# Patient Record
Sex: Female | Born: 1980 | Race: Black or African American | Hispanic: No | Smoking: Never smoker
Health system: Southern US, Community
[De-identification: ages and names within clinical notes are randomized; demographics above are authoritative.]

## PROBLEM LIST (undated history)

## (undated) DIAGNOSIS — K219 Gastro-esophageal reflux disease without esophagitis: Secondary | ICD-10-CM

## (undated) DIAGNOSIS — A549 Gonococcal infection, unspecified: Secondary | ICD-10-CM

## (undated) DIAGNOSIS — Z8619 Personal history of other infectious and parasitic diseases: Secondary | ICD-10-CM

## (undated) DIAGNOSIS — D219 Benign neoplasm of connective and other soft tissue, unspecified: Secondary | ICD-10-CM

## (undated) DIAGNOSIS — D509 Iron deficiency anemia, unspecified: Secondary | ICD-10-CM

## (undated) DIAGNOSIS — D649 Anemia, unspecified: Secondary | ICD-10-CM

## (undated) DIAGNOSIS — D259 Leiomyoma of uterus, unspecified: Secondary | ICD-10-CM

## (undated) DIAGNOSIS — J45909 Unspecified asthma, uncomplicated: Secondary | ICD-10-CM

## (undated) DIAGNOSIS — J40 Bronchitis, not specified as acute or chronic: Secondary | ICD-10-CM

## (undated) DIAGNOSIS — Z973 Presence of spectacles and contact lenses: Secondary | ICD-10-CM

## (undated) DIAGNOSIS — J302 Other seasonal allergic rhinitis: Secondary | ICD-10-CM

## (undated) DIAGNOSIS — J4540 Moderate persistent asthma, uncomplicated: Secondary | ICD-10-CM

## (undated) DIAGNOSIS — G4733 Obstructive sleep apnea (adult) (pediatric): Secondary | ICD-10-CM

## (undated) HISTORY — PX: NO PAST SURGERIES: SHX2092

---

## 1999-03-18 ENCOUNTER — Emergency Department (HOSPITAL_COMMUNITY): Admission: EM | Admit: 1999-03-18 | Discharge: 1999-03-18 | Payer: Self-pay | Admitting: Emergency Medicine

## 2000-02-11 ENCOUNTER — Other Ambulatory Visit: Admission: RE | Admit: 2000-02-11 | Discharge: 2000-02-11 | Payer: Self-pay | Admitting: Obstetrics

## 2000-02-11 ENCOUNTER — Encounter (INDEPENDENT_AMBULATORY_CARE_PROVIDER_SITE_OTHER): Payer: Self-pay | Admitting: Specialist

## 2000-04-24 ENCOUNTER — Emergency Department (HOSPITAL_COMMUNITY): Admission: EM | Admit: 2000-04-24 | Discharge: 2000-04-24 | Payer: Self-pay | Admitting: Emergency Medicine

## 2001-02-01 ENCOUNTER — Emergency Department (HOSPITAL_COMMUNITY): Admission: EM | Admit: 2001-02-01 | Discharge: 2001-02-01 | Payer: Self-pay

## 2001-07-01 ENCOUNTER — Emergency Department (HOSPITAL_COMMUNITY): Admission: EM | Admit: 2001-07-01 | Discharge: 2001-07-01 | Payer: Self-pay | Admitting: Emergency Medicine

## 2001-07-02 ENCOUNTER — Emergency Department (HOSPITAL_COMMUNITY): Admission: EM | Admit: 2001-07-02 | Discharge: 2001-07-02 | Payer: Self-pay | Admitting: Emergency Medicine

## 2001-09-01 ENCOUNTER — Inpatient Hospital Stay (HOSPITAL_COMMUNITY): Admission: AD | Admit: 2001-09-01 | Discharge: 2001-09-01 | Payer: Self-pay | Admitting: *Deleted

## 2001-09-03 ENCOUNTER — Inpatient Hospital Stay (HOSPITAL_COMMUNITY): Admission: AD | Admit: 2001-09-03 | Discharge: 2001-09-03 | Payer: Self-pay | Admitting: Obstetrics and Gynecology

## 2001-10-20 ENCOUNTER — Emergency Department (HOSPITAL_COMMUNITY): Admission: EM | Admit: 2001-10-20 | Discharge: 2001-10-20 | Payer: Self-pay | Admitting: *Deleted

## 2004-02-11 ENCOUNTER — Emergency Department (HOSPITAL_COMMUNITY): Admission: EM | Admit: 2004-02-11 | Discharge: 2004-02-12 | Payer: Self-pay

## 2005-03-30 ENCOUNTER — Emergency Department (HOSPITAL_COMMUNITY): Admission: EM | Admit: 2005-03-30 | Discharge: 2005-03-31 | Payer: Self-pay | Admitting: Emergency Medicine

## 2005-08-26 ENCOUNTER — Inpatient Hospital Stay (HOSPITAL_COMMUNITY): Admission: AD | Admit: 2005-08-26 | Discharge: 2005-08-26 | Payer: Self-pay | Admitting: *Deleted

## 2006-04-06 ENCOUNTER — Inpatient Hospital Stay (HOSPITAL_COMMUNITY): Admission: AD | Admit: 2006-04-06 | Discharge: 2006-04-06 | Payer: Self-pay | Admitting: Obstetrics and Gynecology

## 2006-04-20 ENCOUNTER — Ambulatory Visit: Payer: Self-pay | Admitting: Obstetrics and Gynecology

## 2006-06-28 ENCOUNTER — Encounter (INDEPENDENT_AMBULATORY_CARE_PROVIDER_SITE_OTHER): Payer: Self-pay | Admitting: *Deleted

## 2006-06-28 ENCOUNTER — Ambulatory Visit: Payer: Self-pay | Admitting: Family Medicine

## 2006-07-03 ENCOUNTER — Ambulatory Visit (HOSPITAL_COMMUNITY): Admission: RE | Admit: 2006-07-03 | Discharge: 2006-07-03 | Payer: Self-pay | Admitting: Obstetrics and Gynecology

## 2006-07-26 ENCOUNTER — Ambulatory Visit: Payer: Self-pay | Admitting: Obstetrics & Gynecology

## 2006-08-25 ENCOUNTER — Encounter (INDEPENDENT_AMBULATORY_CARE_PROVIDER_SITE_OTHER): Payer: Self-pay | Admitting: *Deleted

## 2006-08-25 ENCOUNTER — Other Ambulatory Visit: Admission: RE | Admit: 2006-08-25 | Discharge: 2006-08-25 | Payer: Self-pay | Admitting: Obstetrics and Gynecology

## 2006-08-25 ENCOUNTER — Ambulatory Visit: Payer: Self-pay | Admitting: Obstetrics and Gynecology

## 2006-09-07 ENCOUNTER — Ambulatory Visit: Payer: Self-pay | Admitting: Family Medicine

## 2007-02-25 ENCOUNTER — Inpatient Hospital Stay (HOSPITAL_COMMUNITY): Admission: AD | Admit: 2007-02-25 | Discharge: 2007-02-26 | Payer: Self-pay | Admitting: Obstetrics & Gynecology

## 2007-07-08 ENCOUNTER — Emergency Department (HOSPITAL_COMMUNITY): Admission: EM | Admit: 2007-07-08 | Discharge: 2007-07-08 | Payer: Self-pay | Admitting: Family Medicine

## 2008-02-26 ENCOUNTER — Inpatient Hospital Stay (HOSPITAL_COMMUNITY): Admission: AD | Admit: 2008-02-26 | Discharge: 2008-02-27 | Payer: Self-pay | Admitting: Obstetrics & Gynecology

## 2008-03-07 ENCOUNTER — Encounter: Payer: Self-pay | Admitting: Physician Assistant

## 2008-03-07 ENCOUNTER — Ambulatory Visit: Payer: Self-pay | Admitting: Obstetrics & Gynecology

## 2008-03-28 ENCOUNTER — Ambulatory Visit: Payer: Self-pay | Admitting: Obstetrics & Gynecology

## 2008-04-03 ENCOUNTER — Ambulatory Visit (HOSPITAL_COMMUNITY): Admission: RE | Admit: 2008-04-03 | Discharge: 2008-04-03 | Payer: Self-pay | Admitting: Family Medicine

## 2008-04-23 ENCOUNTER — Ambulatory Visit: Payer: Self-pay | Admitting: Obstetrics and Gynecology

## 2008-06-11 ENCOUNTER — Ambulatory Visit: Payer: Self-pay | Admitting: Obstetrics and Gynecology

## 2008-12-29 ENCOUNTER — Emergency Department (HOSPITAL_COMMUNITY): Admission: EM | Admit: 2008-12-29 | Discharge: 2008-12-29 | Payer: Self-pay | Admitting: Anesthesiology

## 2009-06-30 ENCOUNTER — Emergency Department (HOSPITAL_COMMUNITY): Admission: EM | Admit: 2009-06-30 | Discharge: 2009-06-30 | Payer: Self-pay | Admitting: Emergency Medicine

## 2009-07-08 ENCOUNTER — Ambulatory Visit: Payer: Self-pay | Admitting: Family Medicine

## 2009-07-08 ENCOUNTER — Encounter: Payer: Self-pay | Admitting: Family Medicine

## 2009-07-08 DIAGNOSIS — D649 Anemia, unspecified: Secondary | ICD-10-CM

## 2009-07-08 DIAGNOSIS — L049 Acute lymphadenitis, unspecified: Secondary | ICD-10-CM

## 2009-07-08 LAB — CONVERTED CEMR LAB
BUN: 13 mg/dL (ref 6–23)
Basophils Absolute: 0 10*3/uL (ref 0.0–0.1)
CO2: 23 meq/L (ref 19–32)
Creatinine, Ser: 0.77 mg/dL (ref 0.40–1.20)
Eosinophils Absolute: 0.1 10*3/uL (ref 0.0–0.7)
Eosinophils Relative: 1 % (ref 0–5)
HDL: 49 mg/dL (ref >39)
LDL Cholesterol: 100 mg/dL — ABNORMAL HIGH (ref 0–99)
Lymphocytes Relative: 22 % (ref 12–46)
Lymphs Abs: 1.5 10*3/uL (ref 0.7–4.0)
MCV: 73.8 fL — ABNORMAL LOW (ref 78.0–100.0)
Monocytes Relative: 9 % (ref 3–12)
Neutro Abs: 4.7 10*3/uL (ref 1.7–7.7)
Neutrophils Relative %: 68 % (ref 43–77)
Potassium: 3.9 meq/L (ref 3.5–5.3)
RBC: 4.61 M/uL (ref 3.87–5.11)
RDW: 16.2 % — ABNORMAL HIGH (ref 11.5–15.5)
Sodium: 137 meq/L (ref 135–145)
Total CHOL/HDL Ratio: 3.3
Triglycerides: 55 mg/dL (ref <150)

## 2009-10-05 ENCOUNTER — Emergency Department (HOSPITAL_COMMUNITY): Admission: EM | Admit: 2009-10-05 | Discharge: 2009-10-05 | Payer: Self-pay | Admitting: Emergency Medicine

## 2010-02-23 ENCOUNTER — Telehealth: Payer: Self-pay | Admitting: Family Medicine

## 2010-02-23 ENCOUNTER — Inpatient Hospital Stay (HOSPITAL_COMMUNITY): Admission: AD | Admit: 2010-02-23 | Discharge: 2010-02-23 | Payer: Self-pay | Admitting: Family Medicine

## 2010-03-19 ENCOUNTER — Encounter: Payer: Self-pay | Admitting: Family Medicine

## 2010-07-05 ENCOUNTER — Encounter: Payer: Self-pay | Admitting: *Deleted

## 2010-07-13 NOTE — Letter (Signed)
Summary: Generic Letter  [facility]     [place], [place]    Phone:   Fax:     03/19/2010  Edmund Hilda [address] [place], [place]  [address]  Dear Ms. Perlie Gold,   I am writing to inform you that you need to make an appointment to come in for your yearly well-woman visit, which includes a PAP smear. Please call our office to schedule an appointment with me, Dr. Fara Boros, your new primary care doctor.  I look forward to meeting you!       Sincerely,   Demetria Pore MD  Appended Document: Generic Letter mailed.

## 2010-07-13 NOTE — Assessment & Plan Note (Signed)
Summary: NEW PT/FAMILY MEMBER OF [facility]/MULTIPLE ISSUES/[facility]   Vital Signs:  Patient profile:   30 year old female Height:      70.66 inches Weight:      193 pounds BMI:     27.28 Temp:     98.2 degrees F oral Pulse rate:   100 / minute BP sitting:   123 / 77  (right arm) Cuff size:   regular  Vitals Entered By: Tessie Fass CMA (July 08, 2009 3:20 PM) CC: knot on right side of neck x 2 weeks Is Patient Diabetic? No Pain Assessment Patient in pain? no        CC:  knot on right side of neck x 2 weeks.  History of Present Illness: Kaitlyn Hays comes in as a new patient to our clnic today.  She used to be seen at [facility].  States last seen in September.  She is wishing to establish primary care.  PMH, Meds, All, FH, SH reviewed and put in Centricity.  Has only 1 complaint today - lump on right side of neck. 1) Lump - showed up spontaneously 2 weeks ago.  Has grown slightly in size.  Did become tender a few days ago and so she went to the ED.  Was told it was a lymph node.  Given ibuprofen and sudafed.  No longer tender but bump still there.  No URI symptoms, fever, headaches, weight loss, ngiht sweats.  No cat scratches.  No exposure to TB.  Otherwise healthy without any other complaints.   Habits & Providers  Alcohol-Tobacco-Diet     Tobacco Status: never  Past History:  Past Medical History: Anemia  Past Surgical History: None  Family History: Mother died at age 62 of unknown cancer - but states it was NOT breast cancer Father alive and healthy Brother alive and healthy  Social History: Lives with her younger brother.  Works at [employer].  Never smoked.  No drugs or alcohol.  Graduated high school. Smoking Status:  never  Physical Exam  General:  thin, alert, well-groomed, pleasnt, NAD,  vitals reviewed Eyes:  conjunctiva clear, no injection Ears:  Bilateral TMs normal Mouth:  oropharynx pink and moist without erythema or  exudate. Neck:  see lymph node exam Lungs:  normal WOB, celar to ausc bilaterally Heart:  RRR without murmur Cervical Nodes:  1 mobile, enlarged lymph nose approx 1-1.5 x 1 cm at base of right neck.  not tender.  No other palpable nodes   Impression & Recommendations:  Problem # 1:  ACUTE LYMPHADENITIS (ICD-683) Assessment New solitary enlarged cervical l ymph node.  Differential wide but patient without risk factors or systemic symptoms for things such as cat scratch, TB, cancer.  Likely viral or possibly bacterial.  Will try round of amoxicillin but give full 2 months to resolve before obtaining biopsy.  Patient will return in 2 months for re-eval as well as for pap smear.  Her updated medication list for this problem includes:    Amoxicillin 500 Mg Caps (Amoxicillin) .Marland Kitchen... 1 cap by mouth three times a day for 10 days  Orders: CBC w/Diff-[facility] ([address])  Complete Medication List: 1)  Amoxicillin 500 Mg Caps (Amoxicillin) .Marland Kitchen.. 1 cap by mouth three times a day for 10 days  Other Orders: Lipid-[facility] ([id number]) Basic Met-[facility] ([id number])  Patient Instructions: 1)  Please schedule a pap smear sometime in March.  We can go over your bloodwork then as well.  If anything is badly  abnormal, we will contact you. 2)  Allow 2 months for the lymph node to go away, even after taking antibiotics.  Therefore, try to schedule your follow-up towards the end of March so we can re-evaluate your lymph node. 3)  If it gets much larger quickly or if you start developing them in other areas, please come back in sooner.  4)  It was very nice to meet you today! Prescriptions: AMOXICILLIN 500 MG CAPS (AMOXICILLIN) 1 cap by mouth three times a day for 10 days  #30 x 0   Entered and Authorized by:   Ardeen Garland  MD   Signed by:   Ardeen Garland  MD on 07/08/2009   Method used:   Print then Give to Patient   RxID:   [id number]

## 2010-07-13 NOTE — Progress Notes (Signed)
Summary: triage  Phone Note Call from Patient Call back at [phone]   Caller: Patient Summary of Call: having vag irritation and wants to come in today Initial call taken by: De Nurse,  February 23, 2010 10:30 AM  Follow-up for Phone Call        started yesterday. no discharge. hurts to wipe area. declined work in appt. will see Dr. Earlene Plater at 4:15 due to her work schedule. pcp is here but her scedule is full. advised wearing dress or skirt, not pants or jeans to help with the irritation Follow-up by: Golden Circle RN,  February 23, 2010 10:41 AM  Additional Follow-up for Phone Call Additional follow up Details #1::        Thank you for scheduling the appt.

## 2010-07-29 ENCOUNTER — Encounter: Payer: Self-pay | Admitting: *Deleted

## 2010-08-26 LAB — WET PREP, GENITAL

## 2010-08-26 LAB — GC/CHLAMYDIA PROBE AMP, GENITAL
Chlamydia, DNA Probe: NEGATIVE
GC Probe Amp, Genital: NEGATIVE

## 2010-08-31 LAB — POCT URINALYSIS DIP (DEVICE)
Ketones, ur: NEGATIVE mg/dL
Nitrite: NEGATIVE
Urobilinogen, UA: 0.2 mg/dL (ref 0.0–1.0)

## 2010-08-31 LAB — WET PREP, GENITAL: Trich, Wet Prep: NONE SEEN

## 2010-08-31 LAB — GC/CHLAMYDIA PROBE AMP, GENITAL: GC Probe Amp, Genital: NEGATIVE

## 2010-09-19 LAB — POCT I-STAT, CHEM 8
Calcium, Ion: 1.03 mmol/L — ABNORMAL LOW (ref 1.12–1.32)
Creatinine, Ser: 0.9 mg/dL (ref 0.4–1.2)
Glucose, Bld: 81 mg/dL (ref 70–99)
Hemoglobin: 13.6 g/dL (ref 12.0–15.0)

## 2010-09-19 LAB — URINALYSIS, ROUTINE W REFLEX MICROSCOPIC
Hgb urine dipstick: NEGATIVE
Ketones, ur: NEGATIVE mg/dL
Nitrite: NEGATIVE
Specific Gravity, Urine: 1.021 (ref 1.005–1.030)
Urobilinogen, UA: 1 mg/dL (ref 0.0–1.0)
pH: 6.5 (ref 5.0–8.0)

## 2010-09-19 LAB — CBC
Hemoglobin: 12.2 g/dL (ref 12.0–15.0)
MCHC: 32.4 g/dL (ref 30.0–36.0)
Platelets: 289 10*3/uL (ref 150–400)
WBC: 8.1 10*3/uL (ref 4.0–10.5)

## 2010-09-19 LAB — WET PREP, GENITAL: Yeast Wet Prep HPF POC: NONE SEEN

## 2010-09-19 LAB — DIFFERENTIAL
Basophils Absolute: 0 10*3/uL (ref 0.0–0.1)
Basophils Relative: 0 % (ref 0–1)
Eosinophils Relative: 1 % (ref 0–5)
Monocytes Absolute: 0.8 10*3/uL (ref 0.1–1.0)
Neutro Abs: 5.6 10*3/uL (ref 1.7–7.7)

## 2010-09-19 LAB — POTASSIUM: Potassium: 3.9 mEq/L (ref 3.5–5.1)

## 2010-10-04 ENCOUNTER — Inpatient Hospital Stay (INDEPENDENT_AMBULATORY_CARE_PROVIDER_SITE_OTHER)
Admission: RE | Admit: 2010-10-04 | Discharge: 2010-10-04 | Disposition: A | Discharge status: Home / Self Care | Payer: Self-pay | Source: Ambulatory Visit | Attending: Emergency Medicine | Admitting: Emergency Medicine

## 2010-10-04 DIAGNOSIS — N760 Acute vaginitis: Secondary | ICD-10-CM

## 2010-10-04 DIAGNOSIS — A499 Bacterial infection, unspecified: Secondary | ICD-10-CM

## 2010-10-04 LAB — POCT URINALYSIS DIP (DEVICE)
Bilirubin Urine: NEGATIVE
Hgb urine dipstick: NEGATIVE
Ketones, ur: NEGATIVE mg/dL

## 2010-10-04 LAB — POCT I-STAT, CHEM 8
Creatinine, Ser: 0.8 mg/dL (ref 0.4–1.2)
Potassium: 4.1 mEq/L (ref 3.5–5.1)

## 2010-10-04 LAB — WET PREP, GENITAL: Trich, Wet Prep: NONE SEEN

## 2010-10-05 LAB — GC/CHLAMYDIA PROBE AMP, GENITAL: Chlamydia, DNA Probe: NEGATIVE

## 2010-10-26 NOTE — Group Therapy Note (Signed)
NAMELATEIA, FRASER NO.:  [id number]   MEDICAL RECORD NO.:  [id number]          PATIENT TYPE:  WOC   LOCATION:  [facility]                   FACILITY:  [facility]   PHYSICIAN:  Caren Griffins, CNM       DATE OF BIRTH:  1981/01/27   DATE OF SERVICE:  04/23/2008                                  CLINIC NOTE   REASON FOR VISIT:  Here for followup to get ultrasound results.   HISTORY:  This is a 30 year old nulliparous AA female, who has been  followed here for her abnormal uterine bleeding secondary to myomatous  uterus.  She also had an abnormal Pap smear about a year; however, her  most recent one on February 26, 2008 was normal.  She was seen in [facility]  on February 26, 2008 for heavy vaginal bleeding and at that time was  noted to have a hemoglobin of 6 with hematocrit of 19.9.  She states she  has no orthostatic symptoms and that she is taking her iron on most days  2 times a day.  She does want to preserve her fertility and is having  dyspareunia, but no bowel and bladder problems as long as she stays on  the stool softener.  She received the Depo on February 26, 2008, and  initially stopped bleeding and then began bleeding on March 22, 2008  for approximately 3 weeks and is now tapered off.  She had been on OCPs  in the past and self-discontinued and had trouble remembering to take  them.  She is with a stable mutually monogamous partner and declines any  STI testing at this time.   PHYSICAL EXAMINATION:  GENERAL:  WN, WD, in NAD.  ABDOMEN:  Soft, flat, and nontender.  GU:  Uterus is palpable just above the symphysis pubis.   Pelvic ultrasound done on April 03, 2008 reveals a fibroid uterus,  which is slightly increased since last exam in January 2008.  Her  largest fibroid is 8.6 x 7.3 x 7.7, and they are numerous.  Endometrial  stripe was within normal limits at 10 mm.  Ovaries normal.  These  results are shared with the patient.   ASSESSMENT:  A  30 year old nulliparous female with abnormal bleeding  secondary to myomatous uterus.  Bleeding has been refractory to first  Depo shot.   PLAN:  Advised her of all the results in this workup.  She is advised to  return if she has severe bleeding or becomes symptomatic at any point.  Otherwise, to continue taking her iron and continue keeping a menstrual  calendar.  She will be due to return on May 27, 2008 to assess for  another Depo-Provera shot.  We did discuss her normal TSH of 1.024 and  methods to deal with her dyspareunia including position changes and  lubrication.           ______________________________  Caren Griffins, CNM     DP/MEDQ  D:  04/23/2008  T:  04/24/2008  Job:  [id number]

## 2010-10-29 NOTE — Group Therapy Note (Signed)
NAMESAMA, ARAUZ NO.:  [id number]   MEDICAL RECORD NO.:  [id number]          PATIENT TYPE:  WOC   LOCATION:  [facility]                   FACILITY:  [facility]   PHYSICIAN:  Dorthula Perfect, MD     DATE OF BIRTH:  1981-02-16   DATE OF SERVICE:  07/26/2006                                  CLINIC NOTE   A 30 year old black female returns for followup visit.  She was seen by  Dr. Shawnie Pons June 28, 2006 with a diagnosis of abnormal uterine  bleeding, and infertility.  As a historian, the patient has some  difficulty remembering dates.  It sounds like last summer she would skip  a number of periods.  She was apparently started on Yasmin sometime in  the fall, and then started having regular periods, each of which was  heavy and continued for many, many days.  Her last menstrual period was  back in December.  When she saw Dr. Shawnie Pons, she was started on Loestrin  with instructions of taking them continuously without taking the brown  pills.  She is in the middle of the second pack.  The heavier bleeding  that she has has pretty much stopped.  She only has light pinkish  staining now.  Thyroid study was done, and the result of the TSH was  1.413, which is normal.  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  Also showed  positive HPV.  Ultrasound was obtained which revealed an enlarged uterus  that contained numerous fibroids.  The largest of which was 3.5x3.0x3.9  cm.  Ovaries were normal.  The impression on the report was enlarged  uterus containing numerous fibroids.  The largest is 3.9 cm in  diameter.   PHYSICAL EXAMINATION:  Height 5 feet 11 inches, weight 205.  Blood  pressure 126/81.  ABDOMEN:  Flat, soft, and nontender.  PELVIC EXAM:  External genitalia, BUS glands are normal.  Vagina well  epithelialized.  The cervix was nulliparous.  There was only slight  pinkish staining.  On bimanual exam, the uterus  is upper limits of size  to perhaps 6 weeks, and somewhat retroverted, with the posterior surface  being somewhat nodular and tender. Adnexal areas were negative.   IMPRESSION:  1. History of abnormal uterine bleeding.  2. Uterine fibroid.   DISPOSITION:  1. Continue with the Loestrin per Dr. Shawnie Pons.  She is instructed to      take it for a total of 3 cycles.  I have asked her after she      completes the third cycle to stop them.  2. She will return in March for a colposcopy.  3. If the colposcopic exam and potential biopsies are negative, and we      find out what her bleeding pattern is like after she stops the      Loestrin, the patient is then interested in having      hysterosalpingogram and infertility      evaluation.  She understands that she will need to log in that way.  4. Fibroids  were explained to the patient.  Surgery is not needed for      those.           ______________________________  Dorthula Perfect, MD     ER/MEDQ  D:  07/26/2006  T:  07/26/2006  Job:  [id number]

## 2010-10-29 NOTE — Group Therapy Note (Signed)
NAMEGIANNAMARIE, PAULUS NO.:  [id number]   MEDICAL RECORD NO.:  [id number]          PATIENT TYPE:  WOC   LOCATION[facility]                   FACILITY:  [facility]   PHYSICIAN:  Argentina Donovan, MD        DATE OF BIRTH:  [birth year]   DATE OF SERVICE:  04/20/2006                                    CLINIC NOTE   The patient is a 30 year old nulligravida African American female, who went  into the maternity admissions office about 10 days ago with dysfunctional  uterine bleeding, had been bleeding since mid September.  Her hematocrit was  36 at that time.  They did not treat her, but referred her to the clinic,  and she came down here today.  She is still bleeding fairly heavily without  cramping.  Prior to this episode her periods were somewhat regular, although  she would miss a month or two at a time.   She is tall, slender, 5 feet 10 inches tall, did not give me a weight today.  She has not had a Pap smear in several years, but was bleeding too much to  do one today. Examination of the external genitalia is normal.  BUS within  normal limits.  Vagina is clean, well-rugated, with a moderate amount of  blood in the vault, and the cervix is nulliparous with moderate amount of  blood coming from cervical os.  The uterus is anterior, of normal size,  shape, consistency, and the adnexa is normal.  The patient has been put on  Yaz twice a day for 10 days, with a week break, than once a day.  She has  been instructed to call in 10 days if the bleeding has not stopped, and we  will schedule her for a D&C.   IMPRESSION:  Dysfunctional uterine bleeding.           ______________________________  Argentina Donovan, MD     PR/MEDQ  D:  04/20/2006  T:  04/21/2006  Job:  [id number]

## 2010-10-29 NOTE — Group Therapy Note (Signed)
Kaitlyn Hays, GAMM NO.:  [id number]   MEDICAL RECORD NO.:  [id number]          PATIENT TYPE:  WOC   [facility]ATION[facility]                   [facility]ACILITY:  [facility]   PHYSICIAN:  Kaitlyn Donovan, MD        DATE OF BIRTH:  [birth year]   DATE OF SERVICE:                                  CLINIC NOTE   CHIEF COMPLAINT:  Here for abnormal uterine bleeding and colposcopy.   HISTORY OF PRESENT ILLNESS:  [name] here  for:  1. Abnormal uterine bleeding.  She has been taking Loestrin now for      about 2 months.  She is taking this continuously without any of the      brown placebo pills.  This is in effort to stop her irregular      bleeding.  She has had improvement, but is still getting some pink      stains.  There are some days that are worse than others with some      days having small red and some days having small brown, but still      small and consistent abnormal slight bleeding.  She is low on her      pills.  She only has 4 of the active pills left today, so she will      need a refill as well.  She does not smoke.  Her age of menarche      was 30 years old.  Urine pregnancy test is negative.  2. Constipation:  The patient sometimes goes a whole week without      having a bowel movement.  She states since last week has only had a      bowel movement every 2 to 3 days.  She has lots of straining and      pressure when she does have bowel movements.  This is often      associated with some bright red blood per rectum, but she does not      have excruciating pain just sitting down, et cetera.  3. Abnormal Pap smear:  The patient had LGSIL back in late January.      Was first Pap smear in several years.  As far as she knows, it was      her first abnormal Pap smear and that is why she is here for      colposcopy today.   OBJECTIVE:  Pressure 133/89, weight 203.3.  GENERAL:  In no acute distress.  Alert and oriented, appropriate.  Mood  and  affect.  EXTERNAL GENITALIA:  Normal.  Normal pink rugous vagina.  Fairly normal-  appearing cervix with some subtle white changes in the 6-7 o'clock  position, extending into the cervical os, and some changes as well in  the 3 o'clock position as brought out with the vinegar.  No external hemorrhoids noted.   On colposcopy, we took biopsies of both of these areas and did  endocervical curettage.  There were no vessel changes noted.   ASSESSMENT AND PLAN:  [name] with:  1. 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  We will wait and have the results of the biopsies at the 2      o'clock and 7 o'clock areas, as well as endocervical curettage.      She will follow up in 2 weeks for this.  2. Constipation/hemorrhoids:  The patient was advised that she should      eat a higher fiber diet and consider using MiraLax to adjust for 1      bowel movement per day, soft in consistency.  3. Abnormal uterine bleeding.  We will try continuing the Loestrin for      now.  She was advised that, if she      continues this, there is a good chance that this will control her      abnormal uterine bleeding after biopsy.  Followup in 2 months.           ______________________________  Kaitlyn Donovan, MD     PR/MEDQ  D:  08/25/2006  T:  08/26/2006  Job:  [id number]

## 2010-10-29 NOTE — Group Therapy Note (Signed)
Kaitlyn Hays, Kaitlyn Hays NO.:  [id number]   MEDICAL RECORD NO.:  [id number]          PATIENT TYPE:  WOC   [facility]ATION[facility]                   [facility]ACILITY:  [facility]   PHYSICIAN:  Tinnie Gens, MD        DATE OF BIRTH:  1980/12/23   DATE OF SERVICE:  06/28/2006                                  CLINIC NOTE   CHIEF COMPLAINT:  Followup.   HISTORY OF PRESENT ILLNESS:  The patient is a 30 year old nulligravida  who was previously seen here in November for abnormal uterine bleeding.  She reports that she normally has monthly cycles. However, in September,  October, November she did have some abnormal bleeding. She was referred  down from the maternity admissions unit. She was found to have a  hemoglobin of 9.5 at the time. She was started on Yasmin, which she has  been taking and then would forget sometimes and not take and she had  some breakthrough bleeding with that. Then, she came off of it and had  some further bleeding and was put on Loestrin and is here for followup.  It was questionable whether she needed a D&C.   PAST MEDICAL HISTORY:  Is negative.   PAST SURGICAL HISTORY:  Negative.   MEDICATIONS:  Loestrin.   ALLERGIES:  None known.   OB HISTORY:  She is a G0.   GYN HISTORY:  She has not had a pap for approximately 4 years. She uses  condoms intermittently. However, she has never been pregnant and was  concerned about this. She has had two previous sexual partners. She does  have a history of GC after the first one. The rest of her partners have  two kids.   FAMILY HISTORY:  Coronary artery disease.   SOCIAL HISTORY:  She works with special needs people. She is not offered  insurance through her job. No tobacco, alcohol or drug use.   14-point review of systems is reviewed. She has had some weight gain,  chest pains, blood in her stools, loss of urine with coughing and  sneezing and vaginal bleeding as described in the HPI. The patient does  not have  a primary care physician, one will be provided for her.   PHYSICAL EXAMINATION:  Weight is 205. Height is 5 feet, 11 inches. Blood  pressure 126/81, pulse 97, temp 97.4. She is well-developed, well-  nourished African American female in no acute distress.  Sclerae anicteric.  NECK: Supple.  ABDOMEN: Soft and nontender. Nondistended.  EXTREMITIES: No cyanosis, clubbing or edema.  GU: Normal external female genitalia. BUS is normal. Vagina is pink and  rugated. Cervix is nulliparous without lesions. Uterus is small, about 6  to 8 week size, retroverted, firm. No adnexal mass. She does have some  right-sided adnexal tenderness.   IMPRESSION:  1. Abnormal uterine bleeding, unclear etiology. Minimal workup at this      point.  2. Infertility.   PLAN:  1. Continue Loestrin, will skip placebos in the next 2 months while      workup ensues.  2. Pap smear today.  3. __________.  4. Pelvic  sonogram.  5. The patient will follow up in 3 to 4 weeks for discussion on      results and discussion of what to do in aid in pregnancy. She      probably needs an HSG.           ______________________________  Tinnie Gens, MD     TP/MEDQ  D:  06/28/2006  T:  06/28/2006  Job:  [id number]

## 2011-03-03 LAB — I-STAT 8, (EC8 V) (CONVERTED LAB)
Acid-base deficit: 1
Chloride: 109
HCT: 31 — ABNORMAL LOW
Operator id: 235561
Potassium: 4
TCO2: 26
pCO2, Ven: 43.2 — ABNORMAL LOW
pH, Ven: 7.361 — ABNORMAL HIGH

## 2011-03-03 LAB — POCT PREGNANCY, URINE
Operator id: 235561
Preg Test, Ur: NEGATIVE

## 2011-03-03 LAB — POCT URINALYSIS DIP (DEVICE)
Ketones, ur: NEGATIVE
Nitrite: NEGATIVE
Operator id: 235561
Protein, ur: 100 — AB
pH: 6

## 2011-03-14 LAB — CBC
MCHC: 30
MCV: 54.3 — ABNORMAL LOW
Platelets: 328
RBC: 3.66 — ABNORMAL LOW
WBC: 7.6

## 2011-03-14 LAB — WET PREP, GENITAL

## 2011-03-14 LAB — GC/CHLAMYDIA PROBE AMP, GENITAL
Chlamydia, DNA Probe: NEGATIVE
GC Probe Amp, Genital: NEGATIVE

## 2011-03-24 LAB — URINALYSIS, ROUTINE W REFLEX MICROSCOPIC
Glucose, UA: NEGATIVE
Protein, ur: NEGATIVE
pH: 6

## 2011-03-24 LAB — URINE MICROSCOPIC-ADD ON

## 2011-03-24 LAB — WET PREP, GENITAL

## 2011-03-24 LAB — POCT PREGNANCY, URINE: Preg Test, Ur: NEGATIVE

## 2011-09-14 ENCOUNTER — Encounter (HOSPITAL_COMMUNITY): Payer: Self-pay | Admitting: *Deleted

## 2011-09-14 ENCOUNTER — Emergency Department (INDEPENDENT_AMBULATORY_CARE_PROVIDER_SITE_OTHER)
Admission: EM | Admit: 2011-09-14 | Discharge: 2011-09-14 | Disposition: A | Discharge status: Home / Self Care | Payer: Self-pay | Source: Home / Self Care | Attending: Emergency Medicine | Admitting: Emergency Medicine

## 2011-09-14 DIAGNOSIS — A499 Bacterial infection, unspecified: Secondary | ICD-10-CM

## 2011-09-14 DIAGNOSIS — B373 Candidiasis of vulva and vagina: Secondary | ICD-10-CM

## 2011-09-14 DIAGNOSIS — N760 Acute vaginitis: Secondary | ICD-10-CM

## 2011-09-14 HISTORY — DX: Benign neoplasm of connective and other soft tissue, unspecified: D21.9

## 2011-09-14 HISTORY — DX: Anemia, unspecified: D64.9

## 2011-09-14 LAB — POCT PREGNANCY, URINE: Preg Test, Ur: NEGATIVE

## 2011-09-14 LAB — WET PREP, GENITAL
Trich, Wet Prep: NONE SEEN
Yeast Wet Prep HPF POC: NONE SEEN

## 2011-09-14 LAB — POCT URINALYSIS DIP (DEVICE)
Bilirubin Urine: NEGATIVE
Glucose, UA: NEGATIVE mg/dL
Protein, ur: NEGATIVE mg/dL
Specific Gravity, Urine: >=1.03 (ref 1.005–1.030)

## 2011-09-14 MED ORDER — METRONIDAZOLE 500 MG PO TABS: 500.0000 mg | ORAL_TABLET | Freq: Two times a day (BID) | ORAL | Status: AC

## 2011-09-14 MED ORDER — FLUCONAZOLE 150 MG PO TABS: 150.0000 mg | ORAL_TABLET | Freq: Once | ORAL | Status: AC

## 2011-09-14 NOTE — ED Notes (Signed)
Pt has  symprtoms  Of  Vaginal  irritaion and  A  Slight  Vaginal  Discharge     X  1  Week  Irritation began  Yesterday    denys  Any abd  Pain or   Bleeding       She  Is  Sitting  Upright on  Exam table      And  Ambulates  With a  Steady  Fluid  gait

## 2011-09-14 NOTE — Discharge Instructions (Signed)
To restore the normal balance of "good bacteria" in your system.  Take a probiotic once daily.  These can be gotten over the counter at the drug store without a prescription and come under various brand names such as Culturelle, Align, Florastore, and Phillips.  The best thing to do is to ask your pharmacist to recommend a good probiotic that is not too expensive.    Candida Infection, Adult A candida infection (also called yeast, fungus and Monilia infection) is an overgrowth of yeast that can occur anywhere on the body. A yeast infection commonly occurs in warm, moist body areas. Usually, the infection remains localized but can spread to become a systemic infection. A yeast infection may be a sign of a more severe disease such as diabetes, leukemia, or AIDS. A yeast infection can occur in both men and women. In women, Candida vaginitis is a vaginal infection. It is one of the most common causes of vaginitis. Men usually do not have symptoms or know they have an infection until other problems develop. Men may find out they have a yeast infection because their sex partner has a yeast infection. Uncircumcised men are more likely to get a yeast infection than circumcised men. This is because the uncircumcised glans is not exposed to air and does not remain as dry as that of a circumcised glans. Older adults may develop yeast infections around dentures. CAUSES  Women  Antibiotics.   Steroid medication taken for a long time.   Being overweight (obese).   Diabetes.   Poor immune condition.   Certain serious medical conditions.   Immune suppressive medications for organ transplant patients.   Chemotherapy.   Pregnancy.   Menstration.   Stress and fatigue.   Intravenous drug use.   Oral contraceptives.   Wearing tight-fitting clothes in the crotch area.   Catching it from a sex partner who has a yeast infection.   Spermicide.   Intravenous, urinary, or other catheters.   Men  Catching it from a sex partner who has a yeast infection.   Having oral or anal sex with a person who has the infection.   Spermicide.   Diabetes.   Antibiotics.   Poor immune system.   Medications that suppress the immune system.   Intravenous drug use.   Intravenous, urinary, or other catheters.  SYMPTOMS  Women  Thick, white vaginal discharge.   Vaginal itching.   Redness and swelling in and around the vagina.   Irritation of the lips of the vagina and perineum.   Blisters on the vaginal lips and perineum.   Painful sexual intercourse.   Low blood sugar (hypoglycemia).   Painful urination.   Bladder infections.   Intestinal problems such as constipation, indigestion, bad breath, bloating, increase in gas, diarrhea, or loose stools.  Men  Men may develop intestinal problems such as constipation, indigestion, bad breath, bloating, increase in gas, diarrhea, or loose stools.   Dry, cracked skin on the penis with itching or discomfort.   Jock itch.   Dry, flaky skin.   Athlete's foot.   Hypoglycemia.  DIAGNOSIS  Women  A history and an exam are performed.   The discharge may be examined under a microscope.   A culture may be taken of the discharge.  Men  A history and an exam are performed.   Any discharge from the penis or areas of cracked skin will be looked at under the microscope and cultured.   Stool samples may be cultured.    TREATMENT  Women  Vaginal antifungal suppositories and creams.   Medicated creams to decrease irritation and itching on the outside of the vagina.   Warm compresses to the perineal area to decrease swelling and discomfort.   Oral antifungal medications.   Medicated vaginal suppositories or cream for repeated or recurrent infections.   Wash and dry the irritation areas before applying the cream.   Eating yogurt with lactobacillus may help with prevention and treatment.   Sometimes painting the vagina  with gentian violet solution may help if creams and suppositories do not work.  Men  Antifungal creams and oral antifungal medications.   Sometimes treatment must continue for 30 days after the symptoms go away to prevent recurrence.  HOME CARE INSTRUCTIONS  Women  Use cotton underwear and avoid tight-fitting clothing.   Avoid colored, scented toilet paper and deodorant tampons or pads.   Do not douche.   Keep your diabetes under control.   Finish all the prescribed medications.   Keep your skin clean and dry.   Consume milk or yogurt with lactobacillus active culture regularly. If you get frequent yeast infections and think that is what the infection is, there are over-the-counter medications that you can get. If the infection does not show healing in 3 days, talk to your caregiver.   Tell your sex partner you have a yeast infection. Your partner may need treatment also, especially if your infection does not clear up or recurs.  Men  Keep your skin clean and dry.   Keep your diabetes under control.   Finish all prescribed medications.   Tell your sex partner that you have a yeast infection so they can be treated if necessary.  SEEK MEDICAL CARE IF:   Your symptoms do not clear up or worsen in one week after treatment.   You have an oral temperature above 102 F (38.9 C).   You have trouble swallowing or eating for a prolonged time.   You develop blisters on and around your vagina.   You develop vaginal bleeding and it is not your menstrual period.   You develop abdominal pain.   You develop intestinal problems as mentioned above.   You get weak or lightheaded.   You have painful or increased urination.   You have pain during sexual intercourse.  MAKE SURE YOU:   Understand these instructions.   Will watch your condition.   Will get help right away if you are not doing well or get worse.  Document Released: 07/07/2004 Document Revised: 05/19/2011 Document  Reviewed: 10/19/2009 [employer] Patient Information 2012 [employer].Bacterial Vaginosis Bacterial vaginosis (BV) is a vaginal infection where the normal balance of bacteria in the vagina is disrupted. The normal balance is then replaced by an overgrowth of certain bacteria. There are several different kinds of bacteria that can cause BV. BV is the most common vaginal infection in women of childbearing age. CAUSES   The cause of BV is not fully understood. BV develops when there is an increase or imbalance of harmful bacteria.   Some activities or behaviors can upset the normal balance of bacteria in the vagina and put women at increased risk including:   Having a new sex partner or multiple sex partners.   Douching.   Using an intrauterine device (IUD) for contraception.   It is not clear what role sexual activity plays in the development of BV. However, women that have never had sexual intercourse are rarely infected with BV.  Women do   not get BV from toilet seats, bedding, swimming pools or from touching objects around them.  SYMPTOMS   Grey vaginal discharge.   A fish-like odor with discharge, especially after sexual intercourse.   Itching or burning of the vagina and vulva.   Burning or pain with urination.   Some women have no signs or symptoms at all.  DIAGNOSIS  Your caregiver must examine the vagina for signs of BV. Your caregiver will perform lab tests and look at the sample of vaginal fluid through a microscope. They will look for bacteria and abnormal cells (clue cells), a pH test higher than 4.5, and a positive amine test all associated with BV.  RISKS AND COMPLICATIONS   Pelvic inflammatory disease (PID).   Infections following gynecology surgery.   Developing HIV.   Developing herpes virus.  TREATMENT  Sometimes BV will clear up without treatment. However, all women with symptoms of BV should be treated to avoid complications, especially if gynecology surgery  is planned. Female partners generally do not need to be treated. However, BV may spread between female sex partners so treatment is helpful in preventing a recurrence of BV.   BV may be treated with antibiotics. The antibiotics come in either pill or vaginal cream forms. Either can be used with nonpregnant or pregnant women, but the recommended dosages differ. These antibiotics are not harmful to the baby.   BV can recur after treatment. If this happens, a second round of antibiotics will often be prescribed.   Treatment is important for pregnant women. If not treated, BV can cause a premature delivery, especially for a pregnant woman who had a premature birth in the past. All pregnant women who have symptoms of BV should be checked and treated.   For chronic reoccurrence of BV, treatment with a type of prescribed gel vaginally twice a week is helpful.  HOME CARE INSTRUCTIONS   Finish all medication as directed by your caregiver.   Do not have sex until treatment is completed.   Tell your sexual partner that you have a vaginal infection. They should see their caregiver and be treated if they have problems, such as a mild rash or itching.   Practice safe sex. Use condoms. Only have 1 sex partner.  PREVENTION  Basic prevention steps can help reduce the risk of upsetting the natural balance of bacteria in the vagina and developing BV:  Do not have sexual intercourse (be abstinent).   Do not douche.   Use all of the medicine prescribed for treatment of BV, even if the signs and symptoms go away.   Tell your sex partner if you have BV. That way, they can be treated, if needed, to prevent reoccurrence.  SEEK MEDICAL CARE IF:   Your symptoms are not improving after 3 days of treatment.   You have increased discharge, pain, or fever.  MAKE SURE YOU:   Understand these instructions.   Will watch your condition.   Will get help right away if you are not doing well or get worse.  FOR MORE  INFORMATION  Division of STD Prevention (DSTDP), Centers for Disease Control and Prevention: www.cdc.gov/std American [employer] Association (ASHA): www.ashastd.org  Document Released: 05/30/2005 Document Revised: 05/19/2011 Document Reviewed: 11/20/2008 [employer] Patient Information 2012 [employer]. 

## 2011-09-14 NOTE — ED Provider Notes (Signed)
Chief Complaint  Patient presents with  . Vaginal Itching    History of Present Illness:   Kaitlyn Hays is a 31 year old female who's had a one-week history of vaginal itching and discharge. She denies any odor, pelvic pain, or lower back pain. No fever, chills, sweats, nausea, or vomiting. No urinary symptoms. She does have a history of bacterial vaginosis in the past. No history of STDs. Last menstrual period was February 11. She is sexually active without any birth control.  Review of Systems:  Other than noted above, the patient denies any of the following symptoms: Systemic:  No fever, chills, sweats, fatigue, or weight loss. GI:  No abdominal pain, nausea, anorexia, vomiting, diarrhea, constipation, melena or hematochezia. GU:  No dysuria, frequency, urgency, hematuria, vaginal discharge, itching, or abnormal vaginal bleeding. Skin:  No rash or itching.   PMFSH:  Past medical history, family history, social history, meds, and allergies were reviewed.  Physical Exam:   Vital signs:  BP 127/85  Pulse 86  Temp(Src) 98.6 F (37 C) (Oral)  Resp 18  SpO2 100%  LMP 07/25/2011 General:  Alert, oriented and in no distress. Lungs:  Breath sounds clear and equal bilaterally.  No wheezes, rales or rhonchi. Heart:  Regular rhythm.  No gallops or murmers. Abdomen:  Soft, flat and non-distended.  No organomegaly or mass.  No tenderness, guarding or rebound.  Bowel sounds normally active. Pelvic exam:  External genitalia unremarkable. Vaginal mucosa was normal. There was a small amount of whitish discharge. Cervix appeared normal. Uterus was midposition and normal in size and shape. There were no adnexal masses or tenderness. No cervical motion tenderness. No uterine tenderness. Skin:  Clear, warm and dry.  Labs:   Results for orders placed during the hospital encounter of 09/14/11  POCT URINALYSIS DIP (DEVICE)      Component Value Range   Glucose, UA NEGATIVE  NEGATIVE (mg/dL)   Bilirubin Urine  NEGATIVE  NEGATIVE    Ketones, ur TRACE (*) NEGATIVE (mg/dL)   Specific Gravity, Urine >=1.030  1.005 - 1.030    Hgb urine dipstick NEGATIVE  NEGATIVE    pH 5.5  5.0 - 8.0    Protein, ur NEGATIVE  NEGATIVE (mg/dL)   Urobilinogen, UA 0.2  0.0 - 1.0 (mg/dL)   Nitrite NEGATIVE  NEGATIVE    Leukocytes, UA TRACE (*) NEGATIVE   POCT PREGNANCY, URINE      Component Value Range   Preg Test, Ur NEGATIVE  NEGATIVE   WET PREP, GENITAL      Component Value Range   Yeast Wet Prep HPF POC NONE SEEN  NONE SEEN    Trich, Wet Prep NONE SEEN  NONE SEEN    Clue Cells Wet Prep HPF POC FEW (*) NONE SEEN    WBC, Wet Prep HPF POC FEW (*) NONE SEEN      Assessment:  The primary encounter diagnosis was Candida vaginitis. A diagnosis of Bacterial vaginosis was also pertinent to this visit.  Plan:   1.  The following meds were prescribed:   New Prescriptions   FLUCONAZOLE (DIFLUCAN) 150 MG TABLET    Take 1 tablet (150 mg total) by mouth once.   METRONIDAZOLE (FLAGYL) 500 MG TABLET    Take 1 tablet (500 mg total) by mouth 2 (two) times daily.   2.  The patient was instructed in symptomatic care and handouts were given. 3.  The patient was told to return if becoming worse in any way, if no better in  3 or 4 days, and given some red flag symptoms that would indicate earlier return.    Reuben Likes, MD 09/14/11 2037

## 2011-09-15 LAB — GC/CHLAMYDIA PROBE AMP, GENITAL: GC Probe Amp, Genital: NEGATIVE

## 2011-11-01 ENCOUNTER — Ambulatory Visit (INDEPENDENT_AMBULATORY_CARE_PROVIDER_SITE_OTHER): Payer: Self-pay | Admitting: Family Medicine

## 2011-11-01 ENCOUNTER — Encounter: Payer: Self-pay | Admitting: Family Medicine

## 2011-11-01 VITALS — BP 117/74 | HR 81 | Temp 97.6°F | Ht 71.0 in | Wt 195.9 lb

## 2011-11-01 DIAGNOSIS — R42 Dizziness and giddiness: Secondary | ICD-10-CM

## 2011-11-01 DIAGNOSIS — D259 Leiomyoma of uterus, unspecified: Secondary | ICD-10-CM

## 2011-11-01 DIAGNOSIS — D219 Benign neoplasm of connective and other soft tissue, unspecified: Secondary | ICD-10-CM | POA: Insufficient documentation

## 2011-11-01 DIAGNOSIS — D509 Iron deficiency anemia, unspecified: Secondary | ICD-10-CM

## 2011-11-01 LAB — CBC
HCT: 28.7 % — ABNORMAL LOW (ref 36.0–46.0)
MCH: 18.1 pg — ABNORMAL LOW (ref 26.0–34.0)
MCV: 61.9 fL — ABNORMAL LOW (ref 78.0–100.0)
Platelets: 435 10*3/uL — ABNORMAL HIGH (ref 150–400)
RDW: 19.2 % — ABNORMAL HIGH (ref 11.5–15.5)
WBC: 7.5 10*3/uL (ref 4.0–10.5)

## 2011-11-01 MED ORDER — IRON 325 (65 FE) MG PO TABS: 325.0000 mg | ORAL_TABLET | Freq: Every day | ORAL | Status: DC

## 2011-11-01 NOTE — Patient Instructions (Signed)
It was great to see you today!  Schedule an appointment to see me asap for a PAP.  We did a CBC today.  I will call you with the results.  I have prescribed IRON for you.  We offered you a tdap today.  You have been referred to GYN for your fibroids.

## 2011-11-01 NOTE — Assessment & Plan Note (Signed)
Last [place] in 09.  She is having intermenstrual bleeding and heavy menses with Iron def. Anemia Will refer to GYN for planning treatment.

## 2011-11-01 NOTE — Assessment & Plan Note (Signed)
Likely related to vaginal bleeding from fibroids. Will obtain cbc today to check. Last in 2012. Will prescribe daily iron supplementation.

## 2011-11-01 NOTE — Assessment & Plan Note (Signed)
Off and on without sob and chest pain. Possibly related to anemia.

## 2011-11-01 NOTE — Progress Notes (Signed)
  Subjective:   [name], female DOB: [birth year] 31 y.o. MRN: [id number] HPI: New patient. Complete history performed today.   1. Fibroids Synopsis: patient was diagnosed with fibroids in 2004. She wants children and doesn't have any now. She has intermenstrual and heavy periods. She also complains of pain with periods. Long periods lasting 11 days. Pain with intercourse. No blood thinners, no BCP.  Location: uterus Onset: has been chronic and worsening Time period of: since 2004 year(s).  Severity is described as moderate to severe.  Aggravating: periods Alleviating: unknown Associated sx/sn: none  2. Light headedness Synopsis: patient has noticed in the last month that when she gets up to quickly she gets dizzy and light headed. She has a history of anemia and heavy vaginal bleeding.  Location: head Onset: has been acute  Time period of: 1 month(s).  Severity is described as mild-moderate.  Aggravating: standing up to quickly.  Alleviating: none Associated sx/sn: heavy menstrual bleeding and anemia.   History  Substance Use Topics  . Smoking status: Never Smoker   . Smokeless tobacco: Not on file  . Alcohol Use: No    Review of Systems: Pertinent items are noted in HPI.  Labs Reviewed: cbc/bmp/u/a/ultrasound 09. Last labs 2012 Reviewed Chart Review for last notes.     Objective:   Filed Vitals:   11/01/11 1617  BP: 117/74  Pulse: 81  Temp: 97.6 F (36.4 C)  TempSrc: Oral  Height: 5\' 11"  (1.803 m)  Weight: 195 lb 14.4 oz (88.86 kg)   Physical Exam: General: aaf, nad, pleasant Lungs:  Normal respiratory effort, chest expands symmetrically. Lungs are clear to auscultation, no crackles or wheezes. Heart - Regular rate and rhythm.  Flow murmur, gallops or rubs. Abdomen: soft and non-tender without masses, organomegaly or hernias noted.  No guarding or rebound Extremities:   Non-tender, No cyanosis, edema, or deformity noted. Skin:  Intact without  suspicious lesions or rashes Neck:  No deformities, thyromegaly, masses, or tenderness noted.   Supple with full range of motion without pain.   Assessment & Plan:

## 2011-11-02 ENCOUNTER — Encounter: Payer: Self-pay | Admitting: Advanced Practice Midwife

## 2011-11-02 ENCOUNTER — Telehealth: Payer: Self-pay | Admitting: Family Medicine

## 2011-11-02 NOTE — Telephone Encounter (Signed)
Discussed anemia with patient. Cbc results.  Lab Results  Component Value Date   WBC 7.5 11/01/2011   HGB 8.4* 11/01/2011   HCT 28.7* 11/01/2011   MCV 61.9* 11/01/2011   PLT 435* 11/01/2011   Patient will take her iron. She will see GYN 2nd to fibroids.

## 2011-11-17 ENCOUNTER — Ambulatory Visit: Payer: Self-pay | Admitting: Family Medicine

## 2011-12-01 ENCOUNTER — Encounter: Payer: Self-pay | Admitting: Advanced Practice Midwife

## 2012-02-03 ENCOUNTER — Ambulatory Visit (INDEPENDENT_AMBULATORY_CARE_PROVIDER_SITE_OTHER): Payer: Self-pay | Admitting: Family Medicine

## 2012-02-03 ENCOUNTER — Other Ambulatory Visit (HOSPITAL_COMMUNITY)
Admission: RE | Admit: 2012-02-03 | Discharge: 2012-02-03 | Disposition: A | Discharge status: Home / Self Care | Payer: Self-pay | Source: Ambulatory Visit | Attending: Family Medicine | Admitting: Family Medicine

## 2012-02-03 ENCOUNTER — Encounter: Payer: Self-pay | Admitting: Family Medicine

## 2012-02-03 VITALS — BP 124/79 | HR 83 | Temp 98.4°F | Ht 71.0 in | Wt 195.0 lb

## 2012-02-03 DIAGNOSIS — N949 Unspecified condition associated with female genital organs and menstrual cycle: Secondary | ICD-10-CM

## 2012-02-03 DIAGNOSIS — N898 Other specified noninflammatory disorders of vagina: Secondary | ICD-10-CM

## 2012-02-03 DIAGNOSIS — Z113 Encounter for screening for infections with a predominantly sexual mode of transmission: Secondary | ICD-10-CM | POA: Insufficient documentation

## 2012-02-03 DIAGNOSIS — R102 Pelvic and perineal pain: Secondary | ICD-10-CM

## 2012-02-03 LAB — POCT WET PREP (WET MOUNT)

## 2012-02-03 LAB — HIV ANTIBODY (ROUTINE TESTING W REFLEX): HIV: NONREACTIVE

## 2012-02-03 MED ORDER — AZITHROMYCIN 1 G PO PACK
1.0000 g | PACK | Freq: Once | ORAL | Status: AC
  Administered 2012-02-03: 1 g via ORAL

## 2012-02-03 MED ORDER — METRONIDAZOLE 500 MG PO TABS: 2000.0000 mg | ORAL_TABLET | Freq: Once | ORAL | Status: AC

## 2012-02-03 MED ORDER — CEFTRIAXONE SODIUM 1 G IJ SOLR
250.0000 mg | Freq: Once | INTRAMUSCULAR | Status: AC
  Administered 2012-02-03: 250 mg via INTRAMUSCULAR

## 2012-02-03 NOTE — Progress Notes (Signed)
  Subjective:    [name], female    DOB: December 14, 1980, 31 y.o.   MRN: [id number]  HPI  Here for new vaginal discharge  With odor.  No abdominal pain or fever or dysuria.  Reports partner has trichomonas and is being treated.  No recent abx use.  Review of Systems See HPI    Objective:   Physical Exam GEN: NAD Pelvic Exam:        External: normal female genitalia without lesions or masses        Vagina: normal without lesions or masses        Cervix: normal without lesions or masses, very tender to movement- patient reports not usually feeling discomfort with pelvic exams        Adnexa: normal bimanual exam without masses or fullness        Uterus: enlarged.          Samples for Wet prep, GC/Chlamydia obtained        Assessment & Plan:

## 2012-02-03 NOTE — Assessment & Plan Note (Signed)
History of trich exposure.  Also with BV.  Will treat with metronidazole.  Also treated for GC/Chlamydia due to pelvi tenderness.  Labs pendings.

## 2012-02-03 NOTE — Patient Instructions (Addendum)
Go pick up medicine at pharmacy for trichomonas  Because your uterus was so tender, will give you treatment for gonorrhea and chlamydia  I will call you with positive results Trichomoniasis Trichomoniasis is an infection, caused by the Trichomonas organism, that affects both women and men. In women, the outer female genitalia and the vagina are affected. In men, the penis is mainly affected, but the prostate and other reproductive organs can also be involved. Trichomoniasis is a sexually transmitted disease (STD) and is most often passed to another person through sexual contact. The majority of people who get trichomoniasis, do so from a sexual encounter and are also at risk for other STDs, including gonorrhea and chlamydia, hepatitis B, and HIV. Trichomoniasis can increase the risk for HIV and pregnancy problems. SYMPTOMS    Abnormal gray-green, frothy vaginal discharge in women.   Itching and irritation of the area outside the vagina in women.   Penile discharge with or without pain in males.   Inflammation of the urethra (urethritis), causing painful urination.   Bleeding after sexual intercourse.  HOME CARE INSTRUCTIONS    Take all medication prescribed by your caregiver.   Take over-the-counter medication for itching or irritation as directed by your caregiver.   Do not have sexual intercourse while you have the infection.   Do not douche or wear tampons while you have the infection.   Discuss the infection with your partner, as your partner may have acquired the infection from you. Or, your partner may have been the person who transmitted the infection to you.   Have your sex partner examined and treated if necessary.   Practice safe, informed, and protected sex.   See your caregiver for other STD testing.  SEEK MEDICAL CARE IF:  You still have symptoms after you finish the medication.   You have an oral temperature above 102 F (38.9 C).   You develop belly  (abdominal) pain.   You have pain when you urinate.   You have bleeding after sexual intercourse.   You develop a rash.   The medication makes you sick or throw up (vomit).  Document Released: 07/07/2004 Document Revised: 05/19/2011 Document Reviewed: 12/19/2008 [facility] Patient Information 2012 [place], [place].

## 2012-02-07 ENCOUNTER — Encounter: Payer: Self-pay | Admitting: Family Medicine

## 2012-11-29 ENCOUNTER — Encounter: Payer: Self-pay | Admitting: Family Medicine

## 2012-11-29 ENCOUNTER — Ambulatory Visit (INDEPENDENT_AMBULATORY_CARE_PROVIDER_SITE_OTHER): Payer: Self-pay | Admitting: Family Medicine

## 2012-11-29 ENCOUNTER — Other Ambulatory Visit (HOSPITAL_COMMUNITY)
Admission: RE | Admit: 2012-11-29 | Discharge: 2012-11-29 | Disposition: A | Discharge status: Home / Self Care | Payer: BC Managed Care – PPO | Source: Ambulatory Visit | Attending: Family Medicine | Admitting: Family Medicine

## 2012-11-29 VITALS — BP 126/80 | HR 98 | Temp 98.2°F | Ht 71.0 in | Wt 204.0 lb

## 2012-11-29 DIAGNOSIS — R87810 Cervical high risk human papillomavirus (HPV) DNA test positive: Secondary | ICD-10-CM | POA: Insufficient documentation

## 2012-11-29 DIAGNOSIS — Z01419 Encounter for gynecological examination (general) (routine) without abnormal findings: Secondary | ICD-10-CM | POA: Insufficient documentation

## 2012-11-29 DIAGNOSIS — N898 Other specified noninflammatory disorders of vagina: Secondary | ICD-10-CM

## 2012-11-29 DIAGNOSIS — Z1151 Encounter for screening for human papillomavirus (HPV): Secondary | ICD-10-CM | POA: Insufficient documentation

## 2012-11-29 DIAGNOSIS — Z124 Encounter for screening for malignant neoplasm of cervix: Secondary | ICD-10-CM

## 2012-11-29 LAB — POCT URINALYSIS DIPSTICK
Glucose, UA: NEGATIVE
Leukocytes, UA: NEGATIVE
Urobilinogen, UA: 0.2

## 2012-11-29 LAB — POCT WET PREP (WET MOUNT): Clue Cells Wet Prep Whiff POC: NEGATIVE

## 2012-11-29 LAB — POCT UA - MICROSCOPIC ONLY

## 2012-11-29 MED ORDER — FLUCONAZOLE 150 MG PO TABS: 150.0000 mg | ORAL_TABLET | Freq: Once | ORAL | Status: DC

## 2012-11-29 NOTE — Assessment & Plan Note (Signed)
Cervical ( ecto and endocervical) tissue obtained. Specimen sent to pathology for PAP I will f/u with result.

## 2012-11-29 NOTE — Progress Notes (Signed)
Subjective:     [name], female   DOB: [birth year], 32 y.o.   MRN: [id number]  HPI Vulva Irritation: X 3 days gradually worsen,she has whitish vaginal discharge,no urine problem,no dysuria,she does have increase urine frequency.She used vagisil with no improvement,she had similar symptoms 8 months ago which went.  PAP: few years ago.Denies abnormal PAP  Current Outpatient Prescriptions on File Prior to Visit  Medication Sig Dispense Refill  . Ferrous Sulfate (IRON) 325 (65 FE) MG TABS Take 325 mg by mouth daily.  30 each  6   No current facility-administered medications on file prior to visit.   Past Medical History  Diagnosis Date  . Anemia   . Fibroids       Review of Systems  Respiratory: Negative.   Cardiovascular: Negative.   Gastrointestinal: Negative.   Genitourinary: Positive for vaginal discharge. Negative for dysuria.  All other systems reviewed and are negative.   Filed Vitals:   11/29/12 1341  BP: 126/80  Pulse: 98  Temp: 98.2 F (36.8 C)  TempSrc: Oral  Height: 5\' 11"  (1.803 m)  Weight: 204 lb (92.534 kg)        Objective:   Physical Exam  Nursing note and vitals reviewed. Constitutional: She appears well-developed. No distress.  Cardiovascular: Normal rate, regular rhythm and normal heart sounds.   No murmur heard. Pulmonary/Chest: Effort normal and breath sounds normal. No respiratory distress. She has no wheezes.  Abdominal: Soft. Bowel sounds are normal. She exhibits no distension. There is no tenderness.  Genitourinary: Uterus normal. Cervix exhibits discharge. Cervix exhibits no motion tenderness. Right adnexum displays no mass, no tenderness and no fullness. Left adnexum displays no mass, no tenderness and no fullness. Vaginal discharge found.         Assessment/Plan:     Vaginal discharge: Likely candida vaginitis                                Wet pre showed many yeast.                                Diflucan  prescribed.  Gynecologic exam: Cervical specimen obtained for PAP.                                Specimen sent to pathology lab,will follow.  More than 25 min was spent on face to face encounter and coordination of care for this patient.

## 2012-11-29 NOTE — Patient Instructions (Signed)
Vaginitis Vaginitis is an inflammation of the vagina. It can happen when the normal bacteria and yeast in the vagina grow too much. There are different types. Treatment will depend on the type you have. HOME CARE  Take all medicines as told by your doctor.  Keep your vagina area clean and dry. Avoid soap. Rinse the area with water.  Avoid washing and cleaning out the vagina (douching).  Do not use tampons or have sex (intercourse) until your treatment is done.  Wipe from front to back after going to the restroom.  Wear cotton underwear.  Avoid wearing underwear while you sleep until your vaginitis is gone.  Avoid tight pants. Avoid underwear or nylons without a cotton panel.  Take off wet clothing (such as a bathing suit) as soon as you can.  Use mild, unscented products. Avoid fabric softeners and scented:  Feminine sprays.  Laundry detergents.  Tampons.  Soaps or bubble baths.  Practice safe sex and use condoms. GET HELP RIGHT AWAY IF:   You have belly (abdominal) pain.  You have a fever or lasting symptoms for more than 2 3 days.  You have a fever and your symptoms suddenly get worse. MAKE SURE YOU:   Understand these instructions.  Will watch this condition.  Will get help right away if you are not doing well or get worse. Document Released: 08/26/2008 Document Revised: 02/22/2012 Document Reviewed: 11/10/2011 [employer] Patient Information 2014 [employer].  

## 2012-11-29 NOTE — Assessment & Plan Note (Signed)
Wet prep positive for many yeast. Diflucan 150 mg x1 prescribed. RTC prn if no improvement.

## 2012-12-01 ENCOUNTER — Telehealth: Payer: Self-pay | Admitting: Sports Medicine

## 2012-12-01 MED ORDER — FLUCONAZOLE 150 MG PO TABS: 150.0000 mg | ORAL_TABLET | Freq: Once | ORAL | Status: DC

## 2012-12-01 NOTE — Telephone Encounter (Signed)
[facility] 24 Hour After-hours Emergency Line  Patient name: Kaitlyn Hays  MRN: [id number]  AGE: 32 y.o.  Gender: female DOB: 06-Jun-1981     Primary Care Provider:   Janit Pagan, MD     Pt seen in clinic for yeast vaginitis on 6/19.  Took Diflucan X 1 and had some improvement but reports still having discharge and slight discomfort.    UA neg, + yeast on wet prep.  S/p 1 diflucan  Will call in 1 additional dose of diflucan to be taken tomorrow if still not improved.  If no better @ 1 week follow up for repeat testing and eval in clinic.   --- DISPOSITION: Rx for Diflucan X 1 for tomorrow.  F/u if not improved @ 1 week   Andrena Mews, DO [facility] Resident - PGY-2 12/01/2012 1:46 PM

## 2012-12-03 ENCOUNTER — Telehealth: Payer: Self-pay | Admitting: Family Medicine

## 2012-12-03 ENCOUNTER — Other Ambulatory Visit: Payer: BC Managed Care – PPO

## 2012-12-03 DIAGNOSIS — N760 Acute vaginitis: Secondary | ICD-10-CM

## 2012-12-03 NOTE — Telephone Encounter (Signed)
I spoke with patient this morning,she stated she feels a little itchy despite taking Diflucan,another dose has been prescribed to patient but she is yet to pick this up at the pharmacy. Patient stated she is not very much sexually active and uses condoms regularly,but I suggested she get checked for GC/Chlamydia even though these does not often present as itching. She agreed to come by the lab today for urine GC/Chlamydia check.

## 2012-12-04 ENCOUNTER — Encounter: Payer: Self-pay | Admitting: Family Medicine

## 2012-12-04 ENCOUNTER — Telehealth: Payer: Self-pay | Admitting: Family Medicine

## 2012-12-04 NOTE — Telephone Encounter (Signed)
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 Patient feels better this morning,test result explained in detail to patient and I recommended colposcopy to her which she agreed with,she will call the front office to schedule appointment. She verbalized understanding of her PAP test result.  I will also sent result note to her home address.

## 2012-12-27 ENCOUNTER — Ambulatory Visit: Payer: BC Managed Care – PPO | Admitting: Family Medicine

## 2012-12-27 ENCOUNTER — Ambulatory Visit: Payer: BC Managed Care – PPO

## 2013-01-03 ENCOUNTER — Encounter: Payer: Self-pay | Admitting: Family Medicine

## 2013-01-03 ENCOUNTER — Ambulatory Visit (INDEPENDENT_AMBULATORY_CARE_PROVIDER_SITE_OTHER): Payer: Self-pay | Admitting: Family Medicine

## 2013-01-03 VITALS — BP 131/80 | HR 104 | Temp 99.2°F | Ht 71.0 in | Wt 201.2 lb

## 2013-01-03 DIAGNOSIS — R6889 Other general symptoms and signs: Secondary | ICD-10-CM

## 2013-01-03 DIAGNOSIS — Z711 Person with feared health complaint in whom no diagnosis is made: Secondary | ICD-10-CM

## 2013-01-03 DIAGNOSIS — IMO0002 Reserved for concepts with insufficient information to code with codable children: Secondary | ICD-10-CM

## 2013-01-03 NOTE — Progress Notes (Signed)
[name], female   DOB: Sep 18, 1980, 32 y.o.   MRN: [id number] Pt presenting today for colposcopy due to being high risk HPV positive.  Pap otherwise negative. No prior abnormal paps.  Patient given informed consent, signed copy in the chart.  Placed in lithotomy position. Cervix viewed with speculum and colposcope after application of acetic acid.   Colposcopy adequate (entire squamocolumnar junctions seen  in entirety) ?  yes Acetowhite lesions?none Punctation?None Mosaicism?  None Abnormal vasculature?  None Biopsies?None necessary ECC?None Complications? None  COMMENTS: Patient was given post procedure instructions.   PLAN: clinically normal colposcopy in setting or Hi risk HPV and normal pap smear. I would recommend repeat co test in one year. I was present for entire procedure Denny Levy

## 2013-01-03 NOTE — Patient Instructions (Signed)
Come back to see Dr. Lum Babe in 2-3 weeks for follow up.   HPV Test The HPV (human papillomavirus) test is used to screen for high-risk types with HPV infection. HPV is a group of about 100 related viruses, of which 40 types are genital viruses. Most HPV viruses cause infections that usually resolve without treatment within 2 years. Some HPV infections can cause skin and genital warts (condylomata). HPV types 16, 18, 31 and 45 are considered high-risk types of HPV. High-risk types of HPV do not usually cause visible warts, but if untreated, may lead to cancers of the outlet of the womb (cervix) or anus. An HPV test identifies the DNA (genetic) strands of the HPV infection. Because the test identifies the DNA strands, the test is also referred to as the HPV DNA test. Although HPV is found in both males and females, the HPV test is only used to screen for cervical cancer in females. This test is recommended for females:  With an abnormal Pap test.  After treatment of an abnormal Pap test.  Aged 45 and older.  After treatment of a high-risk HPV infection. The HPV test may be done at the same time as a Pap test in females over the age of 68. Both the HPV and Pap test require a sample of cells from the cervix. PREPARATION FOR TEST  You may be asked to avoid douching, tampons, or vaginal medicines for 48 hours before the HPV test. You will be asked to urinate before the test. For the HPV test, you will need to lie on an exam table with your feet in stirrups. A spatula will be inserted into the vagina. The spatula will be used to swab the cervix for a cell and mucus sample. The sample will be further evaluated in a lab under a microscope. NORMAL FINDINGS  Normal: High-risk HPV is not found.  Ranges for normal findings may vary among different laboratories and hospitals. You should always check with your doctor after having lab work or other tests done to discuss the meaning of your test results and  whether your values are considered within normal limits. MEANING OF TEST An abnormal HPV test means that high-risk HPV is found. Your caregiver may recommend further testing. Your caregiver will go over the test results with you. He or she will and discuss the importance and meaning of your results, as well as treatment options and the need for additional tests, if necessary. OBTAINING THE RESULTS  It is your responsibility to obtain your test results. Ask the lab or department performing the test when and how you will get your results. Document Released: 06/24/2004 Document Revised: 08/22/2011 Document Reviewed: 03/09/2005 [facility] Patient Information 2014 [place], [place].

## 2013-01-22 ENCOUNTER — Ambulatory Visit: Payer: BC Managed Care – PPO | Admitting: Family Medicine

## 2013-03-19 ENCOUNTER — Other Ambulatory Visit (HOSPITAL_COMMUNITY)
Admission: RE | Admit: 2013-03-19 | Discharge: 2013-03-19 | Disposition: A | Discharge status: Home / Self Care | Payer: Self-pay | Source: Ambulatory Visit | Attending: Sports Medicine | Admitting: Sports Medicine

## 2013-03-19 ENCOUNTER — Encounter: Payer: Self-pay | Admitting: Sports Medicine

## 2013-03-19 ENCOUNTER — Ambulatory Visit (INDEPENDENT_AMBULATORY_CARE_PROVIDER_SITE_OTHER): Payer: BC Managed Care – PPO | Admitting: Sports Medicine

## 2013-03-19 VITALS — BP 127/82 | HR 78 | Temp 98.0°F | Ht 71.0 in | Wt 203.8 lb

## 2013-03-19 DIAGNOSIS — Z113 Encounter for screening for infections with a predominantly sexual mode of transmission: Secondary | ICD-10-CM | POA: Insufficient documentation

## 2013-03-19 DIAGNOSIS — N760 Acute vaginitis: Secondary | ICD-10-CM

## 2013-03-19 LAB — POCT WET PREP (WET MOUNT): Clue Cells Wet Prep Whiff POC: NEGATIVE

## 2013-03-19 MED ORDER — FLUCONAZOLE 150 MG PO TABS: 150.0000 mg | ORAL_TABLET | Freq: Once | ORAL | Status: DC

## 2013-03-19 NOTE — Patient Instructions (Signed)
It was nice to see you today, thanks for coming in!   Will call if different than yeast infection  Add PROBIOTIC to daily regimen - EAT ACTIVIA DAILY    Please plan to return to see Kaitlyn Pagan, MD as needed.    If you need anything prior to your next visit please call the clinic. Please Bring all medications or accurate medication list with you to each appointment; an accurate medication list is essential in providing you the best care possible.

## 2013-03-19 NOTE — Progress Notes (Signed)
[facility] Kaitlyn Hays - 32 y.o. female MRN [id number]  Date of birth: [birth year]  CC, HPI, INTERVAL HISTORY & ROS  Kaitlyn Hays is here today for: evaluation of vaginal discharge   Symptoms (If Blank has not been addressed)  Major Sxs:  Discharge  Character    Onset  2 weeks  Discharge  Yes   Odor  No  Rash  No  Urinary Symptoms  No  Fevers, Chills, Rigors  No  LMP  No LMP recorded.   Sexually Activity  Yes   Pregnant  No  ?STI Exposure  No  Prior infections?  Yes   Recent ABX?  No  Therapy Tried  nothing   History  Past Medical, Surgical, Social, and Family History Reviewed per EMR Medications and Allergies reviewed and all updated if necessary. Objective Findings  VITALS: HR: 78 bpm  BP: 127/82 mmHg  TEMP: 98 F (36.7 C) (Oral)  RESP:    HT: 5\' 11"  (180.3 cm)  WT: 203 lb 12.8 oz (92.443 kg)  BMI: 28.5   BP Readings from Last 3 Encounters:  03/19/13 127/82  01/03/13 131/80  11/29/12 126/80   Wt Readings from Last 3 Encounters:  03/19/13 203 lb 12.8 oz (92.443 kg)  01/03/13 201 lb 3.2 oz (91.264 kg)  11/29/12 204 lb (92.534 kg)     PHYSICAL EXAM: GENERAL: Adult AA  female. In no discomfort; no respiratory distress  PSYCH: alert and appropriate, good insight   HNEENT:   CARDIO:   LUNGS:   ABDOMEN:   EXTREM:    GU: Chaperone: CMA  External: no skin lesions, normal appearing genitalia, white copious discharge  Vagina: no vaginal lesion, vaginal mucosa moist  Cervix: no cervical lesions, no CMT  Uterus: normal size, shape, consistency  non-tender  Adenexa: no masses, non-tender  TESTS:      SKIN:     Assessment & Plan   Problems addressed today: General Plan & Pt Instructions:  1. Vaginitis and vulvovaginitis, unspecified       Will call if different than yeast infection  Add PROBIOTIC to daily regimen - EAT ACTIVIA DAILY      For further discussion of A/P and for follow up issues see problem based charting if  applicable.

## 2013-03-20 ENCOUNTER — Telehealth: Payer: Self-pay | Admitting: Family Medicine

## 2013-03-20 ENCOUNTER — Telehealth: Payer: Self-pay | Admitting: *Deleted

## 2013-03-20 NOTE — Telephone Encounter (Signed)
Will forward to MD. Jazmin Hartsell,CMA  

## 2013-03-20 NOTE — Telephone Encounter (Signed)
Pt called and would like someone to call her about her test results from 10/7. JW

## 2013-03-20 NOTE — Telephone Encounter (Signed)
Pt made aware of results. Jazmin Hartsell,CMA  

## 2013-03-20 NOTE — Telephone Encounter (Signed)
I called to discuss wet prep result,patient did not pick up,message left for her to call back. Result: Wet prep: Positive yeast infection. Already prescribed medication by Dr Berline Chough on 03/19/13

## 2013-03-21 ENCOUNTER — Telehealth: Payer: Self-pay | Admitting: *Deleted

## 2013-03-21 NOTE — Telephone Encounter (Signed)
Message copied by Farrell Ours on Thu Mar 21, 2013 10:51 AM ------      Message from: Gaspar Bidding D      Created: Wed Mar 20, 2013  2:26 PM       Negative GC chlamydia      Please call her and let her know her yeast infection should be treated the medication I have called in.  She does not have a sexually-transmitted infection ------

## 2013-03-21 NOTE — Telephone Encounter (Signed)
Spoke with patient and informed her of below 

## 2013-06-27 ENCOUNTER — Encounter (HOSPITAL_COMMUNITY): Payer: Self-pay | Admitting: Emergency Medicine

## 2013-06-27 ENCOUNTER — Emergency Department (HOSPITAL_COMMUNITY)
Admission: EM | Admit: 2013-06-27 | Discharge: 2013-06-27 | Disposition: A | Discharge status: Home / Self Care | Payer: Self-pay | Attending: Emergency Medicine | Admitting: Emergency Medicine

## 2013-06-27 ENCOUNTER — Emergency Department (HOSPITAL_COMMUNITY): Payer: Self-pay

## 2013-06-27 DIAGNOSIS — R197 Diarrhea, unspecified: Secondary | ICD-10-CM | POA: Insufficient documentation

## 2013-06-27 DIAGNOSIS — Z8742 Personal history of other diseases of the female genital tract: Secondary | ICD-10-CM | POA: Insufficient documentation

## 2013-06-27 DIAGNOSIS — Z862 Personal history of diseases of the blood and blood-forming organs and certain disorders involving the immune mechanism: Secondary | ICD-10-CM | POA: Insufficient documentation

## 2013-06-27 DIAGNOSIS — J40 Bronchitis, not specified as acute or chronic: Secondary | ICD-10-CM

## 2013-06-27 DIAGNOSIS — Z79899 Other long term (current) drug therapy: Secondary | ICD-10-CM | POA: Insufficient documentation

## 2013-06-27 DIAGNOSIS — J209 Acute bronchitis, unspecified: Secondary | ICD-10-CM | POA: Insufficient documentation

## 2013-06-27 DIAGNOSIS — J069 Acute upper respiratory infection, unspecified: Secondary | ICD-10-CM | POA: Insufficient documentation

## 2013-06-27 DIAGNOSIS — Z791 Long term (current) use of non-steroidal anti-inflammatories (NSAID): Secondary | ICD-10-CM | POA: Insufficient documentation

## 2013-06-27 DIAGNOSIS — H9209 Otalgia, unspecified ear: Secondary | ICD-10-CM | POA: Insufficient documentation

## 2013-06-27 DIAGNOSIS — B349 Viral infection, unspecified: Secondary | ICD-10-CM

## 2013-06-27 DIAGNOSIS — R109 Unspecified abdominal pain: Secondary | ICD-10-CM | POA: Insufficient documentation

## 2013-06-27 DIAGNOSIS — B9789 Other viral agents as the cause of diseases classified elsewhere: Secondary | ICD-10-CM | POA: Insufficient documentation

## 2013-06-27 MED ORDER — PREDNISONE 20 MG PO TABS
ORAL_TABLET | ORAL | Status: DC
Start: 2013-06-27 — End: 2013-08-07

## 2013-06-27 MED ORDER — AZITHROMYCIN 250 MG PO TABS: 250.0000 mg | ORAL_TABLET | Freq: Every day | ORAL | Status: DC

## 2013-06-27 MED ORDER — IPRATROPIUM BROMIDE 0.02 % IN SOLN
0.5000 mg | Freq: Once | RESPIRATORY_TRACT | Status: AC
  Administered 2013-06-27: 0.5 mg via RESPIRATORY_TRACT
  Filled 2013-06-27: qty 2.5

## 2013-06-27 MED ORDER — ALBUTEROL SULFATE (2.5 MG/3ML) 0.083% IN NEBU
5.0000 mg | INHALATION_SOLUTION | Freq: Once | RESPIRATORY_TRACT | Status: AC
  Administered 2013-06-27: 5 mg via RESPIRATORY_TRACT
  Filled 2013-06-27: qty 6

## 2013-06-27 MED ORDER — ALBUTEROL SULFATE HFA 108 (90 BASE) MCG/ACT IN AERS
2.0000 | INHALATION_SPRAY | Freq: Once | RESPIRATORY_TRACT | Status: AC
  Administered 2013-06-27: 2 via RESPIRATORY_TRACT
  Filled 2013-06-27: qty 6.7

## 2013-06-27 NOTE — ED Notes (Addendum)
Pt reports a productive  "wheezing cough" over the last few days. C/o chills,headache x 1 week. Faint inspiratory wheeze, bilat-posterior noted. No respiratory distress at present

## 2013-06-27 NOTE — ED Provider Notes (Signed)
CSN: [id number]     Arrival date & time 06/27/13  1254 History  This chart was scribed for non-physician practitioner Jamse Mead, PA-C, working with Merryl Hacker, MD, by Neta Ehlers, ED Scribe. This patient was seen in room WTR5/WTR5 and the patient's care was started at 2:08 PM0.  First MD Initiated Contact with Patient 06/27/13 1300     Chief Complaint  Patient presents with  . Cough  . Headache   The history is provided by the patient. No language interpreter was used.   HPI Comments: Kaitlyn Hays is a 33 y.o. female who presents to the Emergency Department complaining of a cough, productive of white phlegm and occasional bright-red phlegm, which has persisted for a week. As associated symptoms, the pt has also experienced wheezing, mild SOB,  fever, chills, a headache, fatigue, sneezing, otalgia, mild sinus pressure, abdominal pain characterized as "bubbly," and diarrhea. She has used Theraflu, Nyquil, and Aleve without resolution. She denies experiencing neck pain, neck stiffness, eye pain, or chest pain. She also denies a h/o asthma. The pt is a non-smoker.   The pt is a Pharmacist, hospital for a special needs adult.    Past Medical History  Diagnosis Date  . Anemia   . Fibroids    History reviewed. No pertinent past surgical history. Family History  Problem Relation Age of Onset  . Hypertension Other    History  Substance Use Topics  . Smoking status: Never Smoker   . Smokeless tobacco: Not on file  . Alcohol Use: No   No OB history provided.  Review of Systems  Constitutional: Positive for fever, chills and fatigue.  HENT: Positive for congestion, ear pain, sinus pressure and sneezing.   Eyes: Negative for pain.  Respiratory: Positive for cough, shortness of breath and wheezing.   Cardiovascular: Negative for chest pain.  Gastrointestinal: Positive for abdominal pain and diarrhea. Negative for nausea and vomiting.  Musculoskeletal: Negative for neck pain and neck  stiffness.  Neurological: Positive for headaches.  All other systems reviewed and are negative.   Allergies  Review of patient's allergies indicates no known allergies.  Home Medications   Current Outpatient Rx  Name  Route  Sig  Dispense  Refill  . Chlorphen-Pseudoephed-APAP (THERAFLU FLU/COLD PO)   Oral   Take 1 packet by mouth daily.         . naproxen sodium (ANAPROX) 220 MG tablet   Oral   Take 440 mg by mouth 2 (two) times daily with a meal.         . azithromycin (ZITHROMAX) 250 MG tablet   Oral   Take 1 tablet (250 mg total) by mouth daily. Take first 2 tablets together, then 1 every day until finished.   6 tablet   0   . predniSONE (DELTASONE) 20 MG tablet      3 tabs po day one, then 2 tabs daily x 4 days   11 tablet   0    Triage Vitals: BP 148/82  Pulse 97  Temp(Src) 99 F (37.2 C) (Oral)  Resp 20  SpO2 98%  Physical Exam  Nursing note and vitals reviewed. Constitutional: She is oriented to person, place, and time. She appears well-developed and well-nourished. No distress.  HENT:  Head: Normocephalic and atraumatic.  Right Ear: Tympanic membrane and external ear normal. Tympanic membrane is not injected, not erythematous, not retracted and not bulging. No middle ear effusion.  Left Ear: Tympanic membrane and external ear normal. Tympanic  membrane is not injected, not erythematous, not retracted and not bulging.  No middle ear effusion.  Mouth/Throat: Oropharynx is clear and moist. No oropharyngeal exudate.  Negative swelling, erythema, inflammation, lesions, sores, petechiae, exudate noted to the posterior oropharynx. Negative swelling, erythema, petechiae, exudate noted to bilateral tonsils. Uvula midline, symmetrical elevation. Negative uvula swelling. Negative trismus.  Eyes: Conjunctivae and EOM are normal. Pupils are equal, round, and reactive to light. Right eye exhibits no discharge. Left eye exhibits no discharge.  Neck: Normal range of  motion. Neck supple. No tracheal deviation present.  Negative neck stiffness Negative nuchal rigidity Negative cervical lymphadenopathy Negative meningeal signs  Cardiovascular: Normal rate, regular rhythm and normal heart sounds.  Exam reveals no friction rub.   No murmur heard. Cap refill less than 3 seconds  Pulmonary/Chest: Effort normal. No respiratory distress. She has wheezes. She has no rales. She exhibits no tenderness.  Expiratory wheezes noted to bilateral lower lobes upon auscultation Good lung expansion Negative rales or crackles identified Rhonchi noted to bilateral lobes upper and lower Negative use of excess her muscles Patient stable to speak in full sentences without difficulty  Musculoskeletal: Normal range of motion.  Full ROM to upper and lower extremities without difficulty noted, negative ataxia noted.  Lymphadenopathy:    She has no cervical adenopathy.  Neurological: She is alert and oriented to person, place, and time. No cranial nerve deficit. She exhibits normal muscle tone. Coordination normal.  Cranial nerves III-XII grossly intact  Skin: Skin is warm and dry.  Psychiatric: She has a normal mood and affect. Her behavior is normal.    ED Course  Procedures (including critical care time)  DIAGNOSTIC STUDIES: Oxygen Saturation is 98% on room air, normal by my interpretation.    COORDINATION OF CARE:  2:14 PM- Discussed treatment plan with patient, which includes a breathing treatment, and the patient agreed to the plan.   3:08 PM This provider reassess the patient. Continues to have rhonchi when breathing upon auscultation-wheezes have been controlled after nebulizer treatment.  Labs Review Labs Reviewed - No data to display Imaging Review Dg Chest 2 View  06/27/2013   CLINICAL DATA:  Cough, shortness of breath.  EXAM: CHEST  2 VIEW  COMPARISON:  None.  FINDINGS: The heart size and mediastinal contours are within normal limits. Both lungs are clear.  No pleural effusion or pneumothorax is noted. The visualized skeletal structures are unremarkable.  IMPRESSION: No active cardiopulmonary disease.   Electronically Signed   By: Sabino Dick M.D.   On: 06/27/2013 13:50    EKG Interpretation   None       MDM   1. Bronchitis   2. Viral illness   3. URI, acute     Medications  albuterol (PROVENTIL HFA;VENTOLIN HFA) 108 (90 BASE) MCG/ACT inhaler 2 puff (not administered)  albuterol (PROVENTIL) (2.5 MG/3ML) 0.083% nebulizer solution 5 mg (5 mg Nebulization Given 06/27/13 1438)  ipratropium (ATROVENT) nebulizer solution 0.5 mg (0.5 mg Nebulization Given 06/27/13 1438)   Filed Vitals:   06/27/13 1259  BP: 148/82  Pulse: 97  Temp: 99 F (37.2 C)  TempSrc: Oral  Resp: 20  SpO2: 98%    I personally performed the services described in this documentation, which was scribed in my presence. The recorded information has been reviewed and is accurate.  Patient presenting to emergency department with productive cough, sore throat, wheezing, feeling feverish for the past week. Patient reports she's been taking Aleve, NyQuil, TheraFlu with minimal effect. Patient  reports she's been experiencing diarrhea. 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 Alert and oriented. GCS 15. Heart rate and rhythm normal. Lungs noted to have rhonchi to upper and lower lobes bilaterally, expiratory wheezes upon auscultation to bilateral lower lobes. Cap refill less than 3 seconds. Radial pulses 2+ bilaterally. Negative neck stiffness. Negative nuchal rigidity. Negative meningeal signs, negative cervical lymphadenopathy. Full range of motion to upper and lower extremities bilaterally without difficulty noted. Good lung expansion. Negative signs of respiratory distress. Chest x-ray negative for acute cardiopulmonary disease. Breathing treatment administered while in ED setting. Patient responded to breathing treatment while-rhonchi auscultated, wheezing  controlled. PERC score 0 - doubt PE. Doubt pneumonia. Suspicion to be viral illness, bronchitis. Patient stable, afebrile. Discharged patient. Discharge patient with prednisone, albuterol inhaler, azithromycin. Referred patient to primary care provider. Discussed with patient to rest and stay hydrated. Discussed with patient to avoid any physical or shortness activity. Discussed with patient to closely monitor symptoms and if symptoms are to worsen or change to report back to the ED - strict return instructions given.  Patient agreed to plan of care, understood, all questions answered.   Jamse Mead, PA-C 06/28/13 [phone]

## 2013-06-27 NOTE — Discharge Instructions (Signed)
Please call your doctor for a followup appointment within 24-48 hours. When you talk to your doctor please let them know that you were seen in the emergency department and have them acquire all of your records so that they can discuss the findings with you and formulate a treatment plan to fully care for your new and ongoing problems. Please call and set up appointment with primary care provider to be reassessed Please use albuterol inhaler as needed for shortness of breath Please take prednisone and antibiotics as prescribed and on a full stomach Please rest and dehydrated Please avoid any physical or strenuous activity Please continue to monitor symptoms closely and if symptoms are to worsen or change (fever greater than 101, chills, neck pain, neck stiffness, chest pain, shortness of breath, difficulty breathing, worsening cough, difficulty swallowing, numbness, tingling, stomach pain, nausea, vomiting, diarrhea) please report back to emergency department immediately  Bronchitis Bronchitis is inflammation of the airways that extend from the windpipe into the lungs (bronchi). The inflammation often causes mucus to develop, which leads to a cough. If the inflammation becomes severe, it may cause shortness of breath. CAUSES  Bronchitis may be caused by:   Viral infections.   Bacteria.   Cigarette smoke.   Allergens, pollutants, and other irritants.  SIGNS AND SYMPTOMS  The most common symptom of bronchitis is a frequent cough that produces mucus. Other symptoms include:  Fever.   Body aches.   Chest congestion.   Chills.   Shortness of breath.   Sore throat.  DIAGNOSIS  Bronchitis is usually diagnosed through a medical history and physical exam. Tests, such as chest X-rays, are sometimes done to rule out other conditions.  TREATMENT  You may need to avoid contact with whatever caused the problem (smoking, for example). Medicines are sometimes needed. These may  include:  Antibiotics. These may be prescribed if the condition is caused by bacteria.  Cough suppressants. These may be prescribed for relief of cough symptoms.   Inhaled medicines. These may be prescribed to help open your airways and make it easier for you to breathe.   Steroid medicines. These may be prescribed for those with recurrent (chronic) bronchitis. HOME CARE INSTRUCTIONS  Get plenty of rest.   Drink enough fluids to keep your urine clear or pale yellow (unless you have a medical condition that requires fluid restriction). Increasing fluids may help thin your secretions and will prevent dehydration.   Only take over-the-counter or prescription medicines as directed by your health care provider.  Only take antibiotics as directed. Make sure you finish them even if you start to feel better.  Avoid secondhand smoke, irritating chemicals, and strong fumes. These will make bronchitis worse. If you are a smoker, quit smoking. Consider using nicotine gum or skin patches to help control withdrawal symptoms. Quitting smoking will help your lungs heal faster.   Put a cool-mist humidifier in your bedroom at night to moisten the air. This may help loosen mucus. Change the water in the humidifier daily. You can also run the hot water in your shower and sit in the bathroom with the door closed for 5 10 minutes.   Follow up with your health care provider as directed.   Wash your hands frequently to avoid catching bronchitis again or spreading an infection to others.  SEEK MEDICAL CARE IF: Your symptoms do not improve after 1 week of treatment.  SEEK IMMEDIATE MEDICAL CARE IF:  Your fever increases.  You have chills.   You  have chest pain.   You have worsening shortness of breath.   You have bloody sputum.  You faint.  You have lightheadedness.  You have a severe headache.   You vomit repeatedly. MAKE SURE YOU:   Understand these instructions.  Will watch  your condition.  Will get help right away if you are not doing well or get worse. Document Released: 05/30/2005 Document Revised: 03/20/2013 Document Reviewed: 01/22/2013 [facility] Patient Information 2014 [place].   Emergency Department Resource Guide 1) Find a Doctor and Pay Out of Pocket Although you won't have to find out who is covered by your insurance plan, it is a good idea to ask around and get recommendations. You will then need to call the office and see if the doctor you have chosen will accept you as a new patient and what types of options they offer for patients who are self-pay. Some doctors offer discounts or will set up payment plans for their patients who do not have insurance, but you will need to ask so you aren't surprised when you get to your appointment.  2) Contact Your Local Health Department Not all health departments have doctors that can see patients for sick visits, but many do, so it is worth a call to see if yours does. If you don't know where your local health department is, you can check in your phone book. The CDC also has a tool to help you locate your state's health department, and many state websites also have listings of all of their local health departments.  3) Find a [facility] If your illness is not likely to be very severe or complicated, you may want to try a walk in clinic. These are popping up all over the country in pharmacies, drugstores, and shopping centers. They're usually staffed by nurse practitioners or physician assistants that have been trained to treat common illnesses and complaints. They're usually fairly quick and inexpensive. However, if you have serious medical issues or chronic medical problems, these are probably not your best option.  No Primary Care Doctor: - Call Health Connect at  [phone] - they can help you locate a primary care doctor that  accepts your insurance, provides certain services, etc. - Physician Referral  Service- [phone]  Chronic Pain Problems: Organization         Address  Phone   Notes  [facility]  [phone] Patients need to be referred by their primary care doctor.   Medication Assistance: Organization         Address  Phone   Notes  [facility] Medication [facility] [place]., [place], [place] [address] [phone] --Must be a resident of [facility] -- Must have NO insurance coverage whatsoever (no Medicaid/ Medicare, etc.) -- The pt. MUST have a primary care doctor that directs their care regularly and follows them in the community   MedAssist  [phone]   [employer]  [phone]    Agencies that provide inexpensive medical care: Organization         Address  Phone   Notes  [place]  [phone]   [facility]    [phone]   [facility] [place], [place] [address] [phone]   [place] [address] [place] [phone]   Planned Parenthood    [phone]   [facility]    [phone]   [address], [place] Phone:  [phone], Fax:  [phone] Hours of Operation:  9 am - 6 pm, M-F.  Also accepts Medicaid/Medicare and self-pay.  [facility] for [place], [address], [place] Phone: [phone], Fax: [phone]. Hours of Operation:  8:30 am - 5:30 pm, M-F.  Also accepts Medicaid and self-pay.  [facility] [address], [place] Phone: [phone]   [place], [place], [place] [phone], Ext. [phone] Mondays & Thursdays: 7-9 AM.  First 15 patients are seen on a first come, first serve basis.    [place] Providers:  Organization         Address  Phone   Notes  [facility] [address], [place] [phone] Also accepts self-pay patients.  [facility] [address], [place]  [phone]   [place], [address], [place] [phone]   [facility] [address], [place] [phone]   Lucianne Lei [address], [place]   [phone] Only accepts [place] Access [place] patients after they have their name applied to their card.   Self-Pay (no insurance) in [place] [facility]:  Organization         Address  Phone   Notes  Sickle Cell Patients, [facility] [place] [phone]   [facility] [place] [phone]   [facility] [place]  [place], [place], [place] [phone]   [facility]/Dr. Osei-Bonsu  [address], [place] or [address], [place] [phone] Phone number for both [place] and [place] locations is the same.  [facility]gent [facility] and [facility] [address], [place] [phone]   [facility] [address] [place] or [address] [phone]   [facility] [address], [place] [phone], phone; [phone], fax Sees patients 1st and 3rd Saturday of every month.  Must not qualify for public or private insurance (i.e. Medicaid, Medicare, [place] Health Choice, Veterans' Benefits)  Household income should be no more than 200% of the poverty level The clinic cannot treat you if you are pregnant or think you are pregnant  Sexually transmitted diseases are not treated at the clinic.    Dental Care: Organization         Address  Phone  Notes  [facility] Department of [facility] [place] [phone] Accepts children up to age 54 who are enrolled in  [place] or [place]; pregnant women with a Medicaid card; and children who have applied for Medicaid or [place] Health Choice, but were declined, whose parents can pay a reduced fee at time of service.  [facility] Department of [facility]  [address], [place] [phone] Accepts children up to age 19 who are enrolled in [place] or [place]; pregnant women with a Medicaid card; and children who have applied for Medicaid or [place] Health Choice, but were declined, whose parents can pay a reduced fee at time of service.  [place]  Access PROGRAM  [place] [phone] Patients are seen by appointment only. Walk-ins are not accepted. [place] will see patients 83 years of age and older. Monday - Tuesday (8am-5pm) Most Wednesdays (8:30-5pm) $30 per visit, cash only  [facility] Access PROGRAM  [address], [facility] [phone] Patients are seen by appointment only. Walk-ins are not accepted. [place] will see patients 58 years of age and older. One Wednesday Evening (Monthly: Volunteer Based).  $30 per visit, cash only  [place]  [phone] for adults; Children under age 50, call Graduate Pediatric Dentistry at [phone]. Children aged 26-14, please call [phone] to request a pediatric application.  Dental services are provided in all areas of dental care including fillings, crowns and bridges, complete and partial dentures, implants, gum treatment, root canals, and extractions. Preventive care is also provided. Treatment is provided to both adults and children. Patients are selected via a lottery and there is often a waiting list.   [facility] [address] [place]  [phone] [email or web address]   [facility] [address] [place], [place] [phone], Ext. [phone] Second and Fourth Thursday of each month, opens at 6:30  AM; Clinic ends at 9 AM.  Patients are seen on a first-come first-served basis, and a limited number are seen during each clinic.   [facility]  [address] Hillard Danker [place], [place] [phone]   Eligibility Requirements You must have lived in [place], [place], or [place] counties for at least the last three months.   You cannot be eligible for state or federal sponsored [employer], including [employer], [place], or [employer].   You generally cannot be eligible for healthcare insurance through your employer.    How to apply: Eligibility screenings are held every Tuesday and Wednesday afternoon from 1:00 pm until 4:00 pm. You do not need an appointment for the interview!  [facility] [address], [place], [place]   [place]  [place] Department  [place]  [phone]    Behavioral Health Resources in the Community: Intensive Outpatient Programs Organization         Address  Phone  Notes  [place]. [address], [place], [place] [phone]   [facility] Outpatient [address], [place], [place]   ADS: Alcohol & Drug Svcs [address], [place], [place]   [place] [address],  [place], [place] or [phone]   Substance Abuse Resources Organization         Address  Phone  Notes  Alcohol and Drug Services  [phone]   [place]  [phone]   The [place]   Chinita Pester  [phone]   Residential & Outpatient Substance Abuse Program  [phone]   Psychological Services Organization         Address  Phone  Notes  [facility] [place]  [place]  [phone]   [place] [address], [place] or  [phone]    Mobile Crisis Teams Organization         Address  Phone  Notes  [facility], Mobile Crisis Care Unit  [phone]   [facility]  [address] [place],  [place] [address], [place] [phone]    Self-Help/Support Groups Organization         Address  Phone             Notes  Mental Health Assoc. of [place] - variety of support groups  [place] Call for more information  Narcotics Anonymous (NA), [facility] [address], [employer] [place]  2 meetings at this location   Special educational needs teacher         Address  Phone  Notes  [facility] Residential Treatment [place],    [place]  [phone]   [facility]  [address] [place] [phone], [place], [place]   [place], [place] [phone] Admissions: 8am-3pm M-F  Incentives Substance [place] [address]    [place], [place] [phone]   [facility] [address] [place], [place], [place]   [facility] [address],  [place], [place]   Insight Programs - Intensive Outpatient [address] Kristeen [address], [place], [place]   [facility] ([place].) [place].,  [place], [place] [phone] or [phone]   [facility]idential Treatment Services (RTS) [address], [place], [place] Accepts Medicaid  [facility] [place] [address],  [place] [phone] Substance Abuse/Addiction Treatment   [facility] Organization         Address  Phone  Notes  [facility]  [phone]   Domenic Schwab, PhD [address] Arlis Porta [place], [place]   [phone] or [phone]   [place], [place] [phone]   [facility] [address],  [place], [place] [phone] Insurance/Medicaid/sponsorship through [facility] and Families [address] [place]                                    [place], [place] [phone] [place] [address] [place] [address], [place] [phone]    Dr. Adele Schilder  [phone]   [facility] Dept. 1) [address], [place] 2) [place] 3)  [place] [address], [place] [phone]  [phone]   [place] [phone] or [phone] (After Hours)

## 2013-06-27 NOTE — Progress Notes (Signed)
[facility] provided pt with a list of primary care resources and ACA information.  °

## 2013-06-29 NOTE — ED Provider Notes (Signed)
Medical screening examination/treatment/procedure(s) were performed by non-physician practitioner and as supervising physician I was immediately available for consultation/collaboration.  EKG Interpretation   None        Jalaiyah Throgmorton F Verlin Uher, MD 06/29/13 1938 

## 2013-08-07 ENCOUNTER — Inpatient Hospital Stay (HOSPITAL_COMMUNITY): Payer: Self-pay | Admitting: Anesthesiology

## 2013-08-07 ENCOUNTER — Inpatient Hospital Stay (HOSPITAL_COMMUNITY): Payer: Self-pay

## 2013-08-07 ENCOUNTER — Inpatient Hospital Stay (HOSPITAL_COMMUNITY)
Admission: EM | Admit: 2013-08-07 | Discharge: 2013-08-11 | DRG: 493 | Disposition: A | Discharge status: Home Health | Payer: Self-pay | Attending: Orthopedic Surgery | Admitting: Orthopedic Surgery

## 2013-08-07 ENCOUNTER — Encounter (HOSPITAL_COMMUNITY)
Admission: EM | Disposition: A | Discharge status: Home Health | Payer: Self-pay | Source: Home / Self Care | Attending: Orthopedic Surgery

## 2013-08-07 ENCOUNTER — Encounter (HOSPITAL_COMMUNITY): Payer: Self-pay | Admitting: Anesthesiology

## 2013-08-07 ENCOUNTER — Encounter (HOSPITAL_COMMUNITY): Payer: Self-pay | Admitting: Emergency Medicine

## 2013-08-07 ENCOUNTER — Emergency Department (HOSPITAL_COMMUNITY): Payer: Self-pay

## 2013-08-07 DIAGNOSIS — R5381 Other malaise: Secondary | ICD-10-CM | POA: Diagnosis not present

## 2013-08-07 DIAGNOSIS — R5383 Other fatigue: Secondary | ICD-10-CM

## 2013-08-07 DIAGNOSIS — S82209A Unspecified fracture of shaft of unspecified tibia, initial encounter for closed fracture: Principal | ICD-10-CM | POA: Diagnosis present

## 2013-08-07 DIAGNOSIS — D62 Acute posthemorrhagic anemia: Secondary | ICD-10-CM | POA: Diagnosis not present

## 2013-08-07 DIAGNOSIS — W010XXA Fall on same level from slipping, tripping and stumbling without subsequent striking against object, initial encounter: Secondary | ICD-10-CM | POA: Diagnosis present

## 2013-08-07 HISTORY — DX: Unspecified fracture of shaft of unspecified tibia, initial encounter for closed fracture: S82.209A

## 2013-08-07 HISTORY — PX: TIBIA IM NAIL INSERTION: SHX2516

## 2013-08-07 LAB — BASIC METABOLIC PANEL
BUN: 10 mg/dL (ref 6–23)
CO2: 25 mEq/L (ref 19–32)
Calcium: 9.7 mg/dL (ref 8.4–10.5)
Chloride: 103 mEq/L (ref 96–112)
Creatinine, Ser: 0.8 mg/dL (ref 0.50–1.10)
GFR calc non Af Amer: >90 mL/min (ref >90)
Glucose, Bld: 95 mg/dL (ref 70–99)
Potassium: 4 mEq/L (ref 3.7–5.3)
Sodium: 139 mEq/L (ref 137–147)

## 2013-08-07 LAB — CBC
HCT: 27.5 % — ABNORMAL LOW (ref 36.0–46.0)
Hemoglobin: 8.2 g/dL — ABNORMAL LOW (ref 12.0–15.0)
MCH: 19.1 pg — ABNORMAL LOW (ref 26.0–34.0)
MCHC: 29.8 g/dL — ABNORMAL LOW (ref 30.0–36.0)
MCV: 64.1 fL — AB (ref 78.0–100.0)
PLATELETS: 337 10*3/uL (ref 150–400)
RBC: 4.29 MIL/uL (ref 3.87–5.11)
RDW: 17.6 % — AB (ref 11.5–15.5)
WBC: 9.9 10*3/uL (ref 4.0–10.5)

## 2013-08-07 LAB — PROTIME-INR
INR: 1.06 (ref 0.00–1.49)
PROTHROMBIN TIME: 13.6 s (ref 11.6–15.2)

## 2013-08-07 LAB — PREGNANCY, URINE: Preg Test, Ur: NEGATIVE

## 2013-08-07 SURGERY — INSERTION, INTRAMEDULLARY ROD, TIBIA
Anesthesia: General | Site: Leg Lower | Laterality: Left

## 2013-08-07 MED ORDER — FENTANYL CITRATE 0.05 MG/ML IJ SOLN
INTRAMUSCULAR | Status: AC
  Filled 2013-08-07: qty 2

## 2013-08-07 MED ORDER — ACETAMINOPHEN 10 MG/ML IV SOLN
INTRAVENOUS | Status: DC | PRN
  Administered 2013-08-07: 1000 mg via INTRAVENOUS

## 2013-08-07 MED ORDER — HYDROMORPHONE HCL PF 1 MG/ML IJ SOLN
0.5000 mg | INTRAMUSCULAR | Status: DC | PRN
  Administered 2013-08-07: 1 mg via INTRAVENOUS
  Filled 2013-08-07 (×2): qty 1

## 2013-08-07 MED ORDER — FENTANYL CITRATE 0.05 MG/ML IJ SOLN
INTRAMUSCULAR | Status: DC | PRN
  Administered 2013-08-07 (×7): 50 ug via INTRAVENOUS

## 2013-08-07 MED ORDER — ACETAMINOPHEN 500 MG PO TABS
1000.0000 mg | ORAL_TABLET | Freq: Four times a day (QID) | ORAL | Status: DC | PRN
  Administered 2013-08-09 – 2013-08-11 (×5): 1000 mg via ORAL
  Filled 2013-08-07 (×5): qty 2

## 2013-08-07 MED ORDER — DOCUSATE SODIUM 100 MG PO CAPS
100.0000 mg | ORAL_CAPSULE | Freq: Two times a day (BID) | ORAL | Status: DC
  Administered 2013-08-08 – 2013-08-11 (×7): 100 mg via ORAL

## 2013-08-07 MED ORDER — METHOCARBAMOL 100 MG/ML IJ SOLN
500.0000 mg | Freq: Four times a day (QID) | INTRAVENOUS | Status: DC | PRN
  Administered 2013-08-08: 500 mg via INTRAVENOUS
  Filled 2013-08-07 (×3): qty 5

## 2013-08-07 MED ORDER — HYDROCODONE-ACETAMINOPHEN 7.5-325 MG PO TABS
1.0000 | ORAL_TABLET | Freq: Four times a day (QID) | ORAL | Status: DC | PRN
  Administered 2013-08-08: 1 via ORAL
  Administered 2013-08-08 (×2): 2 via ORAL
  Administered 2013-08-08: 1 via ORAL
  Administered 2013-08-09: 2 via ORAL
  Filled 2013-08-07: qty 1
  Filled 2013-08-07: qty 2
  Filled 2013-08-07 (×2): qty 1
  Filled 2013-08-07 (×2): qty 2

## 2013-08-07 MED ORDER — HYDROMORPHONE HCL PF 1 MG/ML IJ SOLN
0.2500 mg | INTRAMUSCULAR | Status: DC | PRN
  Administered 2013-08-07 (×2): 0.25 mg via INTRAVENOUS
  Administered 2013-08-07: 0.5 mg via INTRAVENOUS

## 2013-08-07 MED ORDER — PROPOFOL 10 MG/ML IV BOLUS
INTRAVENOUS | Status: AC
  Filled 2013-08-07: qty 20

## 2013-08-07 MED ORDER — ONDANSETRON HCL 4 MG PO TABS
4.0000 mg | ORAL_TABLET | Freq: Four times a day (QID) | ORAL | Status: DC | PRN
Start: 2013-08-07 — End: 2013-08-11

## 2013-08-07 MED ORDER — CEFAZOLIN SODIUM-DEXTROSE 2-3 GM-% IV SOLR
INTRAVENOUS | Status: DC | PRN
  Administered 2013-08-07: 2 g via INTRAVENOUS

## 2013-08-07 MED ORDER — LIDOCAINE HCL (CARDIAC) 20 MG/ML IV SOLN
INTRAVENOUS | Status: DC | PRN
  Administered 2013-08-07: 75 mg via INTRAVENOUS

## 2013-08-07 MED ORDER — GLYCOPYRROLATE 0.2 MG/ML IJ SOLN
INTRAMUSCULAR | Status: AC
  Filled 2013-08-07: qty 3

## 2013-08-07 MED ORDER — SUCCINYLCHOLINE CHLORIDE 20 MG/ML IJ SOLN
INTRAMUSCULAR | Status: DC | PRN
  Administered 2013-08-07: 100 mg via INTRAVENOUS

## 2013-08-07 MED ORDER — METOCLOPRAMIDE HCL 5 MG/ML IJ SOLN
INTRAMUSCULAR | Status: AC
  Filled 2013-08-07: qty 2

## 2013-08-07 MED ORDER — ENOXAPARIN SODIUM 40 MG/0.4ML ~~LOC~~ SOLN
40.0000 mg | SUBCUTANEOUS | Status: DC
  Administered 2013-08-08 – 2013-08-11 (×4): 40 mg via SUBCUTANEOUS
  Filled 2013-08-07 (×4): qty 0.4

## 2013-08-07 MED ORDER — PROPOFOL 10 MG/ML IV BOLUS
INTRAVENOUS | Status: DC | PRN
  Administered 2013-08-07: 175 mg via INTRAVENOUS

## 2013-08-07 MED ORDER — FENTANYL CITRATE 0.05 MG/ML IJ SOLN
INTRAMUSCULAR | Status: AC
  Filled 2013-08-07: qty 5

## 2013-08-07 MED ORDER — METHOCARBAMOL 500 MG PO TABS
500.0000 mg | ORAL_TABLET | Freq: Four times a day (QID) | ORAL | Status: DC | PRN
  Administered 2013-08-10: 500 mg via ORAL
  Filled 2013-08-07 (×2): qty 1

## 2013-08-07 MED ORDER — 0.9 % SODIUM CHLORIDE (POUR BTL) OPTIME
TOPICAL | Status: DC | PRN
  Administered 2013-08-07: 1000 mL

## 2013-08-07 MED ORDER — ONDANSETRON HCL 4 MG/2ML IJ SOLN
4.0000 mg | Freq: Four times a day (QID) | INTRAMUSCULAR | Status: DC | PRN
  Administered 2013-08-07 – 2013-08-09 (×2): 4 mg via INTRAVENOUS
  Filled 2013-08-07 (×2): qty 2

## 2013-08-07 MED ORDER — ALUM & MAG HYDROXIDE-SIMETH 200-200-20 MG/5ML PO SUSP: 30.0000 mL | Freq: Four times a day (QID) | ORAL | Status: DC | PRN

## 2013-08-07 MED ORDER — NEOSTIGMINE METHYLSULFATE 1 MG/ML IJ SOLN
INTRAMUSCULAR | Status: DC | PRN
  Administered 2013-08-07: 3 mg via INTRAVENOUS

## 2013-08-07 MED ORDER — CEFAZOLIN SODIUM 1-5 GM-% IV SOLN
1.0000 g | Freq: Four times a day (QID) | INTRAVENOUS | Status: AC
  Administered 2013-08-08 (×3): 1 g via INTRAVENOUS
  Filled 2013-08-07 (×3): qty 50

## 2013-08-07 MED ORDER — CEFAZOLIN SODIUM-DEXTROSE 2-3 GM-% IV SOLR
INTRAVENOUS | Status: AC
  Filled 2013-08-07: qty 50

## 2013-08-07 MED ORDER — DEXAMETHASONE SODIUM PHOSPHATE 10 MG/ML IJ SOLN
INTRAMUSCULAR | Status: AC
  Filled 2013-08-07: qty 1

## 2013-08-07 MED ORDER — ALBUTEROL SULFATE (2.5 MG/3ML) 0.083% IN NEBU: 3.0000 mL | INHALATION_SOLUTION | Freq: Four times a day (QID) | RESPIRATORY_TRACT | Status: DC | PRN

## 2013-08-07 MED ORDER — GLYCOPYRROLATE 0.2 MG/ML IJ SOLN
INTRAMUSCULAR | Status: DC | PRN
  Administered 2013-08-07: .7 mg via INTRAVENOUS

## 2013-08-07 MED ORDER — METOCLOPRAMIDE HCL 5 MG/ML IJ SOLN: 5.0000 mg | Freq: Three times a day (TID) | INTRAMUSCULAR | Status: DC | PRN

## 2013-08-07 MED ORDER — ACETAMINOPHEN 10 MG/ML IV SOLN
1000.0000 mg | Freq: Four times a day (QID) | INTRAVENOUS | Status: DC
  Filled 2013-08-07: qty 100

## 2013-08-07 MED ORDER — DEXTROSE IN LACTATED RINGERS 5 % IV SOLN
INTRAVENOUS | Status: DC
  Administered 2013-08-07: 15:00:00 via INTRAVENOUS

## 2013-08-07 MED ORDER — ONDANSETRON HCL 4 MG/2ML IJ SOLN
INTRAMUSCULAR | Status: DC | PRN
  Administered 2013-08-07: 4 mg via INTRAVENOUS

## 2013-08-07 MED ORDER — MIDAZOLAM HCL 2 MG/2ML IJ SOLN
INTRAMUSCULAR | Status: AC
  Filled 2013-08-07: qty 2

## 2013-08-07 MED ORDER — NEOSTIGMINE METHYLSULFATE 1 MG/ML IJ SOLN
INTRAMUSCULAR | Status: AC
  Filled 2013-08-07: qty 10

## 2013-08-07 MED ORDER — HYDROCODONE-ACETAMINOPHEN 5-325 MG PO TABS
2.0000 | ORAL_TABLET | Freq: Once | ORAL | Status: AC
  Administered 2013-08-07: 2 via ORAL
  Filled 2013-08-07: qty 2

## 2013-08-07 MED ORDER — CISATRACURIUM BESYLATE (PF) 10 MG/5ML IV SOLN
INTRAVENOUS | Status: DC | PRN
  Administered 2013-08-07: 8 mg via INTRAVENOUS

## 2013-08-07 MED ORDER — ONDANSETRON 4 MG PO TBDP
4.0000 mg | ORAL_TABLET | Freq: Once | ORAL | Status: AC
  Administered 2013-08-07: 4 mg via ORAL
  Filled 2013-08-07: qty 1

## 2013-08-07 MED ORDER — ONDANSETRON HCL 4 MG/2ML IJ SOLN
4.0000 mg | Freq: Once | INTRAMUSCULAR | Status: DC
  Filled 2013-08-07: qty 2

## 2013-08-07 MED ORDER — METOCLOPRAMIDE HCL 10 MG PO TABS
5.0000 mg | ORAL_TABLET | Freq: Three times a day (TID) | ORAL | Status: DC | PRN
Start: 2013-08-07 — End: 2013-08-09
  Administered 2013-08-09: 10 mg via ORAL
  Filled 2013-08-07: qty 1

## 2013-08-07 MED ORDER — DEXAMETHASONE SODIUM PHOSPHATE 10 MG/ML IJ SOLN
INTRAMUSCULAR | Status: DC | PRN
  Administered 2013-08-07: 10 mg via INTRAVENOUS

## 2013-08-07 MED ORDER — ONDANSETRON HCL 4 MG/2ML IJ SOLN
INTRAMUSCULAR | Status: AC
  Filled 2013-08-07: qty 2

## 2013-08-07 MED ORDER — PROMETHAZINE HCL 25 MG/ML IJ SOLN: 6.2500 mg | INTRAMUSCULAR | Status: DC | PRN

## 2013-08-07 MED ORDER — POLYETHYLENE GLYCOL 3350 17 G PO PACK: 17.0000 g | PACK | Freq: Every day | ORAL | Status: DC | PRN

## 2013-08-07 MED ORDER — HYDROMORPHONE HCL PF 1 MG/ML IJ SOLN
0.5000 mg | INTRAMUSCULAR | Status: DC | PRN
  Administered 2013-08-08 (×2): 0.5 mg via INTRAVENOUS
  Administered 2013-08-09: 1 mg via INTRAVENOUS
  Filled 2013-08-07 (×2): qty 1

## 2013-08-07 MED ORDER — ONDANSETRON HCL 4 MG/2ML IJ SOLN
4.0000 mg | Freq: Four times a day (QID) | INTRAMUSCULAR | Status: DC | PRN
  Administered 2013-08-07: 4 mg via INTRAVENOUS

## 2013-08-07 MED ORDER — HYDROMORPHONE HCL PF 1 MG/ML IJ SOLN
INTRAMUSCULAR | Status: AC
  Filled 2013-08-07: qty 1

## 2013-08-07 MED ORDER — SODIUM CHLORIDE 0.9 % IV SOLN
INTRAVENOUS | Status: DC
  Administered 2013-08-08: 20:00:00 via INTRAVENOUS
  Filled 2013-08-07 (×5): qty 1000

## 2013-08-07 MED ORDER — MIDAZOLAM HCL 5 MG/5ML IJ SOLN
INTRAMUSCULAR | Status: DC | PRN
  Administered 2013-08-07: .5 mg via INTRAVENOUS

## 2013-08-07 MED ORDER — LACTATED RINGERS IV SOLN
INTRAVENOUS | Status: DC | PRN
  Administered 2013-08-07 (×2): via INTRAVENOUS

## 2013-08-07 MED ORDER — SODIUM CHLORIDE 0.9 % IJ SOLN
INTRAMUSCULAR | Status: AC
  Filled 2013-08-07: qty 20

## 2013-08-07 MED ORDER — ONDANSETRON HCL 4 MG PO TABS
4.0000 mg | ORAL_TABLET | Freq: Four times a day (QID) | ORAL | Status: DC | PRN
Start: 2013-08-07 — End: 2013-08-11
  Administered 2013-08-09: 4 mg via ORAL
  Filled 2013-08-07: qty 1

## 2013-08-07 SURGICAL SUPPLY — 55 items
ADH SKN CLS APL DERMABOND .7 (GAUZE/BANDAGES/DRESSINGS) ×1
BAG SPEC THK2 15X12 ZIP CLS (MISCELLANEOUS)
BAG ZIPLOCK 12X15 (MISCELLANEOUS) ×1 IMPLANT
BANDAGE ELASTIC 6 VELCRO ST LF (GAUZE/BANDAGES/DRESSINGS) ×2 IMPLANT
BIT DRILL 3.8X6 NS (BIT) ×2 IMPLANT
BIT DRILL 4.4 NS (BIT) ×2 IMPLANT
CUFF TOURN SGL QUICK 34 (TOURNIQUET CUFF) ×3
CUFF TRNQT CYL 34X4X40X1 (TOURNIQUET CUFF) ×1 IMPLANT
DERMABOND ADVANCED (GAUZE/BANDAGES/DRESSINGS) ×2
DERMABOND ADVANCED .7 DNX12 (GAUZE/BANDAGES/DRESSINGS) IMPLANT
DRAPE C-ARM 42X120 X-RAY (DRAPES) ×3 IMPLANT
DRAPE C-ARMOR (DRAPES) ×2 IMPLANT
DRAPE U-SHAPE 47X51 STRL (DRAPES) ×3 IMPLANT
DRSG AQUACEL AG ADV 3.5X 6 (GAUZE/BANDAGES/DRESSINGS) ×2 IMPLANT
DRSG EMULSION OIL 3X3 NADH (GAUZE/BANDAGES/DRESSINGS) ×1 IMPLANT
DRSG MEPILEX BORDER 4X4 (GAUZE/BANDAGES/DRESSINGS) ×2 IMPLANT
DURAPREP 26ML APPLICATOR (WOUND CARE) ×3 IMPLANT
ELECT REM PT RETURN 9FT ADLT (ELECTROSURGICAL) ×3
ELECTRODE REM PT RTRN 9FT ADLT (ELECTROSURGICAL) ×1 IMPLANT
GLOVE BIOGEL PI IND STRL 7.0 (GLOVE) IMPLANT
GLOVE BIOGEL PI IND STRL 7.5 (GLOVE) ×1 IMPLANT
GLOVE BIOGEL PI IND STRL 8 (GLOVE) ×1 IMPLANT
GLOVE BIOGEL PI INDICATOR 7.0 (GLOVE) ×6
GLOVE BIOGEL PI INDICATOR 7.5 (GLOVE) ×4
GLOVE BIOGEL PI INDICATOR 8 (GLOVE) ×2
GLOVE ECLIPSE 8.0 STRL XLNG CF (GLOVE) IMPLANT
GLOVE ORTHO TXT STRL SZ7.5 (GLOVE) ×6 IMPLANT
GLOVE SURG ORTHO 8.0 STRL STRW (GLOVE) ×1 IMPLANT
GLOVE SURG SS PI 6.5 STRL IVOR (GLOVE) ×6 IMPLANT
GOWN BRE IMP SLV SIRUS LXLNG (GOWN DISPOSABLE) ×9 IMPLANT
GOWN SPEC L3 XXLG W/TWL (GOWN DISPOSABLE) ×6 IMPLANT
GOWN STRL REUS W/TWL LRG LVL3 (GOWN DISPOSABLE) ×3 IMPLANT
GUIDEWIRE BALL NOSE 80CM (WIRE) ×2 IMPLANT
MANIFOLD NEPTUNE II (INSTRUMENTS) ×3 IMPLANT
NAIL TIBIAL 9MMX37.5CM (Nail) ×2 IMPLANT
NS IRRIG 1000ML POUR BTL (IV SOLUTION) ×3 IMPLANT
PACK LOWER EXTREMITY WL (CUSTOM PROCEDURE TRAY) ×3 IMPLANT
PAD CAST 4YDX4 CTTN HI CHSV (CAST SUPPLIES) ×1 IMPLANT
PADDING CAST COTTON 4X4 STRL (CAST SUPPLIES)
PIN GUIDE ACE (PIN) ×2 IMPLANT
POSITIONER SURGICAL ARM (MISCELLANEOUS) ×3 IMPLANT
SCREW ACECAP 32MM (Screw) ×2 IMPLANT
SCREW ACECAP 34MM (Screw) ×2 IMPLANT
SCREW ACECAP 36MM (Screw) ×3 IMPLANT
SCREW PROXIMAL DEPUY (Screw) ×3 IMPLANT
SCREW PRXML FT 45X5.5XLCK NS (Screw) IMPLANT
SPONGE GAUZE 4X4 12PLY (GAUZE/BANDAGES/DRESSINGS) ×3 IMPLANT
SPONGE LAP 18X18 X RAY DECT (DISPOSABLE) ×2 IMPLANT
SUT MNCRL AB 4-0 PS2 18 (SUTURE) ×2 IMPLANT
SUT VIC AB 1 CT1 36 (SUTURE) ×2 IMPLANT
SUT VIC AB 2-0 CT1 27 (SUTURE) ×3
SUT VIC AB 2-0 CT1 TAPERPNT 27 (SUTURE) ×1 IMPLANT
TOWEL OR 17X26 10 PK STRL BLUE (TOWEL DISPOSABLE) ×6 IMPLANT
TRAY FOLEY BAG SILVER LF 14FR (CATHETERS) ×2 IMPLANT
WATER STERILE IRR 1500ML POUR (IV SOLUTION) ×3 IMPLANT

## 2013-08-07 NOTE — ED Provider Notes (Signed)
CSN: [id number]     Arrival date & time 08/07/13  4098 History   First MD Initiated Contact with Patient 08/07/13 [phone]     Chief Complaint  Patient presents with  . Fall  . Leg Pain     (Consider location/radiation/quality/duration/timing/severity/associated sxs/prior Treatment) HPI This is a 33 year old female who presents to the emergency department with chief complaint of left leg pain. This morning she slipped on the Ice with a plnted left leg, twisted and fell to the goud, [facility] felt a snap in her leg and had immediate pain and swelling. Unable to bear weight. Denies numbness or tingling in the foot.  Past Medical History  Diagnosis Date  . Anemia   . Fibroids    No past surgical history on file. Family History  Problem Relation Age of Onset  . Hypertension Other    History  Substance Use Topics  . Smoking status: Never Smoker   . Smokeless tobacco: Not on file  . Alcohol Use: No   OB History   Grav Para Term Preterm Abortions TAB SAB Ect Mult Living                 Review of Systems  Musculoskeletal: Positive for gait problem and joint swelling.  Skin: Negative for wound.  Neurological: Negative for numbness.  All other systems reviewed and are negative.      Allergies  Review of patient's allergies indicates no known allergies.  Home Medications   Current Outpatient Rx  Name  Route  Sig  Dispense  Refill  . acetaminophen (TYLENOL) 500 MG tablet   Oral   Take 1,000 mg by mouth every 6 (six) hours as needed for mild pain.         [place] Kitchen albuterol (PROVENTIL HFA;VENTOLIN HFA) 108 (90 BASE) MCG/ACT inhaler   Inhalation   Inhale 1-2 puffs into the lungs every 6 (six) hours as needed for wheezing or shortness of breath.         . Phenylephrine-DM-GG-APAP (MUCINEX FAST-MAX COLD FLU) 5-10-200-325 MG/10ML LIQD   Oral   Take 30 mLs by mouth as needed (Cough).          BP 137/100  Pulse 100  Temp(Src) 98.3 F (36.8 C) (Oral)  Resp 18  SpO2 100%  LMP  07/21/2013 Physical Exam  Nursing note and vitals reviewed. Constitutional: She is oriented to person, place, and time. She appears well-developed and well-nourished. No distress.  HENT:  Head: Normocephalic and atraumatic.  Eyes: Conjunctivae are normal. No scleral icterus.  Neck: Normal range of motion.  Cardiovascular: Normal rate, regular rhythm and normal heart sounds.  Exam reveals no gallop and no friction rub.   No murmur heard. Pulmonary/Chest: Effort normal and breath sounds normal. No respiratory distress.  Abdominal: Soft. Bowel sounds are normal. She exhibits no distension and no mass. There is no tenderness. There is no guarding.  Musculoskeletal:  Swelling, mid to lower Left extremity.  A minimal bruising noted.  Patient has a firm lower left extremity compartment.  Distal pulses are intact, new numbness or tingling, capillary refill less than 3 seconds.  Neurological: She is alert and oriented to person, place, and time.  Skin: Skin is warm and dry. She is not diaphoretic.    ED Course  Procedures (including critical care time) Labs Review Labs Reviewed - No data to display Imaging Review Dg Tibia/fibula Left  08/07/2013   CLINICAL DATA:  Fall.  EXAM: LEFT TIBIA AND FIBULA - 2 VIEW  COMPARISON:  None.  FINDINGS: Oblique fractures noted through the lower diaphysis of the tibia. Mild displacement is present. Mild angulation deformity. Fibula is intact.  IMPRESSION: Slightly displaced and angulated distal left tibial diaphysis fracture.   Electronically Signed   By: Marcello Moores  Register   On: 08/07/2013 10:20    EKG Interpretation   None       MDM   Final diagnoses:  None   Patient with displaced, a UA dipped distal left tibial diaphysis fracture.  I discussed the findings with the patient.  Patient seen and cared visit with Dr. Alvino Chapel.  The patient's findings have been discussed with Dr. Alvan Dame who is on call for orthopedics and will be by to speak with the patient  about options for possible surgery.  Patient's pain is being treated with by mouth Norco.  She states it is fine at this time.  Patient see n in shared visit with Dr. Alvino Chapel. She will be admitted for surgery by Dr,. Alvan Dame. I personally reviewed the images using our PACS system. I have discussed lab and/or imaging findings with the patient and answered all questions/concerns to the best of my ability. The patient appears reasonably stabilized for admission considering the current resources, flow, and capabilities available in the ED at this time, and I doubt any other [facility] requiring further screening and/or treatment in the ED prior to admission.   Margarita Mail, PA-C 08/09/13 [phone]

## 2013-08-07 NOTE — Anesthesia Postprocedure Evaluation (Signed)
  Anesthesia Post-op Note  Patient: Kaitlyn Hays  Procedure(s) Performed: Procedure(s) (LRB): INTRAMEDULLARY (IM) NAIL TIBIAL (Left)  Patient Location: PACU  Anesthesia Type: General  Level of Consciousness: awake and alert   Airway and Oxygen Therapy: Patient Spontanous Breathing  Post-op Pain: mild  Post-op Assessment: Post-op Vital signs reviewed, Patient's Cardiovascular Status Stable, Respiratory Function Stable, Patent Airway and No signs of Nausea or vomiting  Last Vitals:  Filed Vitals:   08/07/13 2200  BP: 137/75  Pulse: 76  Temp:   Resp: 19    Post-op Vital Signs: stable   Complications: No apparent anesthesia complications

## 2013-08-07 NOTE — Progress Notes (Signed)
Patient arrived from ER to Short Stay to await surgery.

## 2013-08-07 NOTE — H&P (Signed)
Kaitlyn Hays is an 33 y.o. female.   Chief Complaint: Left tibia fracture HPI: 33 yo otherwise healthy female slipped and fell today taking out the trash.  Immediate pain left leg.  Trans[facility] ER.  Evaluation revealed left tibia fracture  Past Medical History  Diagnosis Date  . Anemia   . Fibroids     No past surgical history on file.  Family History  Problem Relation Age of Onset  . Hypertension Other    Social History:  reports that she has never smoked. She does not have any smokeless tobacco history on file. She reports that she does not drink alcohol or use illicit drugs.  Allergies: No Known Allergies   (Not in a hospital admission)  No results found for this or any previous visit (from the past 48 hour(s)). Dg Tibia/fibula Left  08/07/2013   CLINICAL DATA:  Fall.  EXAM: LEFT TIBIA AND FIBULA - 2 VIEW  COMPARISON:  None.  FINDINGS: Oblique fractures noted through the lower diaphysis of the tibia. Mild displacement is present. Mild angulation deformity. Fibula is intact.  IMPRESSION: Slightly displaced and angulated distal left tibial diaphysis fracture.   Electronically Signed   By: Marcello Moores  Register   On: 08/07/2013 10:20    ROS Otherwise healthy Recent ER evaluation for productive cough No recent fever chills night sweats No previous surgery  Blood pressure 124/65, pulse 87, temperature 98.3 F (36.8 C), temperature source Oral, resp. rate 16, last menstrual period 07/21/2013, SpO2 100.00%. Physical Exam  Awake alert cooperative  Comfortable for now with out movement Left leg soft, no clinical concern for compartment syndrome currently No break in skin integrity NVI  Right lower extremity normal  Assessment/Plan Left slight dispalced and angulated tibia fracture Reviewed non-surgical versus operative options.  She did not want to wear long leg cast for 6-8 weeks and felt that surgical fixation made the most sense for her life. She lives independently   Works as Web designer with special education  Will admit and perform surgery tonight as she ate at [employer] ordered NPO  Post operative plan to follow   Lytle [place]burg D 08/07/2013, 1:52 PM

## 2013-08-07 NOTE — ED Notes (Signed)
Surgeon at bedside.  

## 2013-08-07 NOTE — Anesthesia Procedure Notes (Addendum)
Procedure Name: Intubation Date/Time: 08/07/2013 7:32 PM Performed by: Ofilia Neas Pre-anesthesia Checklist: Patient identified, Patient being monitored, Timeout performed, Emergency Drugs available and Suction available Patient Re-evaluated:Patient Re-evaluated prior to inductionOxygen Delivery Method: Circle system utilized Preoxygenation: Pre-oxygenation with 100% oxygen Intubation Type: IV induction, Cricoid Pressure applied and Rapid sequence Laryngoscope Size: Mac and 4 Grade View: Grade II Tube type: Oral Tube size: 7.5 mm Number of attempts: 1 Airway Equipment and Method: Stylet Placement Confirmation: ETT inserted through vocal cords under direct vision,  positive ETCO2 and breath sounds checked- equal and bilateral Secured at: 21 cm Tube secured with: Tape Dental Injury: Teeth and Oropharynx as per pre-operative assessment    Performed by: Ofilia Neas

## 2013-08-07 NOTE — Transfer of Care (Signed)
Immediate Anesthesia Transfer of Care Note  Patient: Kaitlyn Hays  Procedure(s) Performed: Procedure(s): INTRAMEDULLARY (IM) NAIL TIBIAL (Left)  Patient Location: PACU  Anesthesia Type:General  Level of Consciousness: awake, oriented, patient cooperative, lethargic and responds to stimulation  Airway & Oxygen Therapy: Patient Spontanous Breathing and Patient connected to face mask oxygen  Post-op Assessment: Report given to PACU RN, Post -op Vital signs reviewed and stable and Patient moving all extremities  Post vital signs: Reviewed and stable  Complications: No apparent anesthesia complications

## 2013-08-07 NOTE — Preoperative (Signed)
Beta Blockers   Reason not to administer Beta Blockers:Not Applicable 

## 2013-08-07 NOTE — ED Notes (Signed)
Pt states that she was taking the trash out this morning, slipped and fell.  States that she is now having lt low leg pain.

## 2013-08-07 NOTE — ED Notes (Signed)
Paged ortho tech regarding placement of long leg splint, ortho tech requiring more information. Dr. Alvan Dame was paged.

## 2013-08-07 NOTE — Progress Notes (Signed)
[facility] provided pt with a [facility] [employer] Card application to help patient establish primary care.  °

## 2013-08-07 NOTE — Anesthesia Preprocedure Evaluation (Signed)
Anesthesia Evaluation  Patient identified by MRN, date of birth, ID band Patient awake    Reviewed: Allergy & Precautions, H&P , NPO status , Patient's Chart, lab work & pertinent test results  Airway Mallampati: II TM Distance: >3 FB Neck ROM: Full    Dental  (+) Chipped Left upper incisor chipped. Right upper incisor discolored.:   Pulmonary  Occasional bronchitis. OK recently. breath sounds clear to auscultation  Pulmonary exam normal       Cardiovascular negative cardio ROS  Rhythm:Regular Rate:Normal     Neuro/Psych negative neurological ROS  negative psych ROS   GI/Hepatic negative GI ROS, Neg liver ROS,   Endo/Other  negative endocrine ROS  Renal/GU negative Renal ROS  negative genitourinary   Musculoskeletal negative musculoskeletal ROS (+)   Abdominal   Peds negative pediatric ROS (+)  Hematology negative hematology ROS (+) anemia ,   Anesthesia Other Findings   Reproductive/Obstetrics Denies pregnancy and declines testing.                           Anesthesia Physical Anesthesia Plan  ASA: II  Anesthesia Plan: General   Post-op Pain Management:    Induction: Intravenous  Airway Management Planned: Oral ETT  Additional Equipment:   Intra-op Plan:   Post-operative Plan: Extubation in OR  Informed Consent: I have reviewed the patients History and Physical, chart, labs and discussed the procedure including the risks, benefits and alternatives for the proposed anesthesia with the patient or authorized representative who has indicated his/her understanding and acceptance.   Dental advisory given  Plan Discussed with: CRNA  Anesthesia Plan Comments:         Anesthesia Quick Evaluation

## 2013-08-07 NOTE — ED Notes (Signed)
Patient is waiting on surgical consult for left tib/fib.

## 2013-08-08 LAB — BASIC METABOLIC PANEL
BUN: 9 mg/dL (ref 6–23)
CALCIUM: 9.3 mg/dL (ref 8.4–10.5)
CO2: 24 mEq/L (ref 19–32)
Chloride: 101 mEq/L (ref 96–112)
Creatinine, Ser: 0.72 mg/dL (ref 0.50–1.10)
GFR calc Af Amer: >90 mL/min (ref >90)
Glucose, Bld: 130 mg/dL — ABNORMAL HIGH (ref 70–99)
Potassium: 4.1 mEq/L (ref 3.7–5.3)
Sodium: 137 mEq/L (ref 137–147)

## 2013-08-08 LAB — CBC
HCT: 25.8 % — ABNORMAL LOW (ref 36.0–46.0)
Hemoglobin: 7.5 g/dL — ABNORMAL LOW (ref 12.0–15.0)
MCH: 18.7 pg — ABNORMAL LOW (ref 26.0–34.0)
MCHC: 29.1 g/dL — ABNORMAL LOW (ref 30.0–36.0)
MCV: 64.3 fL — ABNORMAL LOW (ref 78.0–100.0)
PLATELETS: 290 10*3/uL (ref 150–400)
RBC: 4.01 MIL/uL (ref 3.87–5.11)
RDW: 17.4 % — ABNORMAL HIGH (ref 11.5–15.5)
WBC: 11.5 10*3/uL — AB (ref 4.0–10.5)

## 2013-08-08 NOTE — Brief Op Note (Signed)
08/07/2013  8:42 AM  PATIENT:  Kaitlyn Hays  33 y.o. female  PRE-OPERATIVE DIAGNOSIS:  Closed left tibia fracture  POST-OPERATIVE DIAGNOSIS:   Closed left tibia fracture  PROCEDURE:  Procedure(s): INTRAMEDULLARY (IM) NAIL TIBIAL (Left)  SURGEON:  Surgeon(s) and Role:    * Mauri Pole, MD - Primary  PHYSICIAN ASSISTANT: None  ANESTHESIA:   general  EBL:   <100cc  BLOOD ADMINISTERED:none  DRAINS: none   LOCAL MEDICATIONS USED:  NONE  SPECIMEN:  No Specimen  DISPOSITION OF SPECIMEN:  N/A  COUNTS:  YES  TOURNIQUET:   Total Tourniquet Time Documented: Thigh (Left) - 11 minutes Total: Thigh (Left) - 11 minutes   DICTATION: .Other Dictation: Dictation Number [id number]  PLAN OF CARE: Admit to inpatient   PATIENT DISPOSITION:  PACU - hemodynamically stable.   Delay start of Pharmacological VTE agent (>24hrs) due to surgical blood loss or risk of bleeding: no

## 2013-08-08 NOTE — Evaluation (Signed)
Physical Therapy Evaluation Patient Details Name: Kaitlyn Hays MRN: [id number] DOB: June 10, 1981 Today's Date: 08/08/2013 Time: 1201-1220 PT Time Calculation (min): 19 min  PT Assessment / Plan / Recommendation History of Present Illness  s/p L tibial IM nail  Clinical Impression  Patient is s/p L tibial IM nail surgery resulting in functional limitations due to the deficits listed below (see PT Problem List).  Patient will benefit from skilled PT to increase their independence and safety with mobility to allow discharge to the venue listed below.  Pt plans to d/c home with her grandmother and limited today by increased pain.     PT Assessment  Patient needs continued PT services    Follow Up Recommendations  Home health PT (may progress to outpatient)    Does the patient have the potential to tolerate intense rehabilitation      Barriers to Discharge        Equipment Recommendations  Rolling walker with 5" wheels    Recommendations for Other Services     Frequency 7X/week    Precautions / Restrictions Precautions Precautions: Fall Restrictions Weight Bearing Restrictions: Yes LLE Weight Bearing: Partial weight bearing   Pertinent Vitals/Pain Reporting 8/10 pain at rest however wanted to work with pt, called for pain meds using call bell and RN in room to give meds upon end of session      Mobility  Bed Mobility Overal bed mobility: Needs Assistance Bed Mobility: Supine to Sit Supine to sit: HOB elevated;Min assist General bed mobility comments: verbal cues for technique, attempted to educate on self assist however pt reporting increased pain so just provided support/assist for L LE Transfers Overall transfer level: Needs assistance Equipment used: Rolling walker (2 wheeled) Transfers: Sit to/from Stand Sit to Stand: Min assist General transfer comment: verbal cues for safe technique  Ambulation/Gait Ambulation/Gait assistance: Min guard Ambulation Distance  (Feet): 35 Feet Assistive device: Rolling walker (2 wheeled) Gait Pattern/deviations: Step-to pattern General Gait Details: verbal cues for sequence and PWB however pt unable to tolerate even PWB and maintained NWB for most of ambulation    Exercises     PT Diagnosis: Difficulty walking;Acute pain  PT Problem List: Decreased knowledge of precautions;Decreased mobility;Pain PT Treatment Interventions: DME instruction;Gait training;Functional mobility training;Patient/family education;Therapeutic activities;Therapeutic exercise;Stair training     PT Goals(Current goals can be found in the care plan section) Acute Rehab PT Goals PT Goal Formulation: With patient Time For Goal Achievement: 08/15/13 Potential to Achieve Goals: Good  Visit Information  Last PT Received On: 08/08/13 Assistance Needed: +1 History of Present Illness: s/p L tibial IM nail       Prior [place] expects to be discharged to:: Private residence Living Arrangements: Alone Available Help at Discharge: Family (plans to d/c to grandmother's home ) Type of Home: House Home Access: Stairs to enter Technical brewer of Steps: 2-3 Entrance Stairs-Rails: Can reach both;Left;Right Home Layout: One level Home Equipment: None Additional Comments: above is grandmother's house as pt lives in 3rd floor apt Prior Function Level of Independence: Independent Communication Communication: No difficulties    Cognition  Cognition Arousal/Alertness: Awake/alert Behavior During Therapy: WFL for tasks assessed/performed Overall Cognitive Status: Within Functional Limits for tasks assessed    Extremity/Trunk Assessment Lower Extremity Assessment Lower Extremity Assessment: LLE deficits/detail LLE Deficits / Details: assist required for bed mobility, increased pain with movement and difficulty tolerating even PWBing LLE: Unable to fully assess due to pain   Balance    End  of Session PT  - End of Session Activity Tolerance: Patient limited by pain Patient left: in chair;with family/visitor present;with nursing/sitter in room;with call bell/phone within reach Nurse Communication: Patient requests pain meds  GP     Kaitlyn Hays,Kaitlyn Hays 08/08/2013, 1:44 PM Carmelia Bake, PT, DPT 08/08/2013 Pager: [phone]

## 2013-08-08 NOTE — Progress Notes (Signed)
[name], female   DOB: 1980/06/29, 33 y.o.   MRN: [id number] Subjective: 1 Day Post-Op Procedure(s) (LRB): INTRAMEDULLARY (IM) NAIL TIBIAL (Left)    Patient reports pain as moderate.  Anxious about movement and recovery period.  Objective:   VITALS:   Filed Vitals:   08/08/13 0653  BP: 117/72  Pulse: 89  Temp: 98.6 F (37 C)  Resp: 18    Neurovascular intact Incision: dressing C/D/I, left leg  LABS  Recent Labs  08/07/13 1345 08/08/13 0355  HGB 8.2* 7.5*  HCT 27.5* 25.8*  WBC 9.9 11.5*  PLT 337 290     Recent Labs  08/07/13 1345 08/08/13 0355  NA 139 137  K 4.0 4.1  BUN 10 9  CREATININE 0.80 0.72  GLUCOSE 95 130*     Recent Labs  08/07/13 1345  INR 1.06     Assessment/Plan: 1 Day Post-Op Procedure(s) (LRB): INTRAMEDULLARY (IM) NAIL TIBIAL (Left)   Advance diet Up with therapy Plan for discharge tomorrow Discharge home with home health  Therapy today, PWB LLE, disposition pending

## 2013-08-08 NOTE — Progress Notes (Signed)
Physical Therapy Treatment Patient Details Name: ANNALYNNE IBANEZ MRN: [id number] DOB: [birth year] Today's Date: 08/08/2013 Time: 8182-9937 PT Time Calculation (min): 27 min  PT Assessment / Plan / Recommendation  History of Present Illness s/p L tibial IM nail   PT Comments   Pt unable to tolerate steps today. Will plan in AM.  Follow Up Recommendations  Home health PT     Does the patient have the potential to tolerate intense rehabilitation     Barriers to Discharge        Equipment Recommendations  Rolling walker with 5" wheels    Recommendations for Other Services    Frequency 7X/week   Progress towards PT Goals Progress towards PT goals: Progressing toward goals  Plan Current plan remains appropriate    Precautions / Restrictions Precautions Precautions: Fall Restrictions Weight Bearing Restrictions: Yes LLE Weight Bearing: Partial weight bearing   Pertinent Vitals/Pain 10  When attempts to place weight.    Mobility  Bed Mobility Overal bed mobility: Needs Assistance Bed Mobility: Supine to Sit;Sit to Supine Supine to sit: HOB elevated;Min assist General bed mobility comments: verbal cues for technique, attempted to educate on self assist however pt reporting increased pain so just provided support/assist for L LE Transfers Overall transfer level: Needs assistance Equipment used: Rolling walker (2 wheeled) Transfers: Sit to/from Stand Sit to Stand: Min assist General transfer comment: verbal cues for safe technique , from [facility] and bed. Ambulation/Gait Ambulation/Gait assistance: Min assist Ambulation Distance (Feet): 50 Feet Assistive device: Rolling walker (2 wheeled) Gait Pattern/deviations: Step-to pattern General Gait Details: verbal cues for sequence and PWB however pt unable to tolerate even PWB and maintained NWB for most of ambulation attempted weight but no weight tolerated. Stairs: Yes General stair comments: attempted to go up 1 step with crutch  and rail but unable to hop up a step.    Exercises     PT Diagnosis: Difficulty walking;Acute pain  PT Problem List: Decreased knowledge of precautions;Decreased mobility;Pain PT Treatment Interventions: DME instruction;Gait training;Functional mobility training;Patient/family education;Therapeutic activities;Therapeutic exercise;Stair training   PT Goals (current goals can now be found in the care plan section) Acute Rehab PT Goals PT Goal Formulation: With patient Time For Goal Achievement: 08/15/13 Potential to Achieve Goals: Good  Visit Information  Last PT Received On: 08/08/13 Assistance Needed: +2 (for steps) History of Present Illness: s/p L tibial IM nail    Subjective Data      Cognition  Cognition Arousal/Alertness: Awake/alert Behavior During Therapy: WFL for tasks assessed/performed Overall Cognitive Status: Within Functional Limits for tasks assessed    Balance     End of Session PT - End of Session Equipment Utilized During Treatment: Gait belt Activity Tolerance: Patient limited by pain Patient left: in bed;with call bell/phone within reach;with family/visitor present Nurse Communication: Mobility status   GP     Claretha Cooper 08/08/2013, 4:36 PM

## 2013-08-08 NOTE — Progress Notes (Addendum)
[facility]  [facility] is providing the following services: RW and [employer]  If patient discharges after hours, please call [phone].   Linward Headland 08/08/2013, 11:37 AM

## 2013-08-08 NOTE — Progress Notes (Signed)
UR completed. Christine Morton RN CCM Case Mgmt phone [phone] 

## 2013-08-09 ENCOUNTER — Encounter (HOSPITAL_COMMUNITY): Payer: Self-pay | Admitting: Orthopedic Surgery

## 2013-08-09 MED ORDER — PROMETHAZINE HCL 25 MG/ML IJ SOLN: 12.5000 mg | Freq: Four times a day (QID) | INTRAMUSCULAR | Status: DC | PRN

## 2013-08-09 MED ORDER — KETOROLAC TROMETHAMINE 15 MG/ML IJ SOLN
7.5000 mg | Freq: Three times a day (TID) | INTRAMUSCULAR | Status: AC
  Administered 2013-08-09: 7.5 mg via INTRAVENOUS
  Filled 2013-08-09 (×2): qty 1

## 2013-08-09 MED ORDER — OXYCODONE HCL 5 MG PO TABS
5.0000 mg | ORAL_TABLET | ORAL | Status: DC
Start: 2013-08-09 — End: 2013-08-09
  Administered 2013-08-09 (×2): 10 mg via ORAL
  Filled 2013-08-09 (×3): qty 2

## 2013-08-09 MED ORDER — METOCLOPRAMIDE HCL 10 MG PO TABS: 5.0000 mg | ORAL_TABLET | Freq: Four times a day (QID) | ORAL | Status: DC | PRN

## 2013-08-09 MED ORDER — METHOCARBAMOL 500 MG PO TABS: 500.0000 mg | ORAL_TABLET | Freq: Four times a day (QID) | ORAL | Status: DC | PRN

## 2013-08-09 MED ORDER — LIP MEDEX EX OINT
TOPICAL_OINTMENT | CUTANEOUS | Status: AC
  Administered 2013-08-09: 11:00:00
  Filled 2013-08-09: qty 7

## 2013-08-09 MED ORDER — OXYCODONE HCL 5 MG PO TABS: 5.0000 mg | ORAL_TABLET | ORAL | Status: DC | PRN

## 2013-08-09 MED ORDER — DEXTROSE-NACL 5-0.9 % IV SOLN
INTRAVENOUS | Status: DC
  Administered 2013-08-10: via INTRAVENOUS

## 2013-08-09 MED ORDER — POLYETHYLENE GLYCOL 3350 17 G PO PACK: 17.0000 g | PACK | Freq: Every day | ORAL | Status: DC | PRN

## 2013-08-09 MED ORDER — DSS 100 MG PO CAPS: 100.0000 mg | ORAL_CAPSULE | Freq: Two times a day (BID) | ORAL | Status: DC

## 2013-08-09 MED ORDER — HYDROMORPHONE HCL 2 MG PO TABS
1.0000 mg | ORAL_TABLET | ORAL | Status: DC | PRN
  Administered 2013-08-11 (×2): 2 mg via ORAL
  Filled 2013-08-09 (×3): qty 1

## 2013-08-09 MED ORDER — METOCLOPRAMIDE HCL 5 MG/ML IJ SOLN
10.0000 mg | Freq: Four times a day (QID) | INTRAMUSCULAR | Status: DC | PRN
  Administered 2013-08-10: 10 mg via INTRAVENOUS
  Filled 2013-08-09: qty 2

## 2013-08-09 MED ORDER — ENOXAPARIN SODIUM 40 MG/0.4ML ~~LOC~~ SOLN: 40.0000 mg | SUBCUTANEOUS | Status: DC

## 2013-08-09 NOTE — ED Provider Notes (Signed)
Medical screening examination/treatment/procedure(s) were performed by non-physician practitioner and as supervising physician I was immediately available for consultation/collaboration.  EKG Interpretation  None   Jasper Riling. Alvino Chapel, MD 08/09/13 [phone]

## 2013-08-09 NOTE — Progress Notes (Signed)
Physical Therapy Treatment Patient Details Name: ZAILAH ZAGAMI MRN: [id number] DOB: 10/08/1980 Today's Date: 08/09/2013 Time: 1751-0258 PT Time Calculation (min): 26 min  PT Assessment / Plan / Recommendation  History of Present Illness s/p L tibial IM nail   PT Comments   Pt continues to be somewhat limited by pain however pleasant and cooperative.  Pt ambulated short distance and practiced steps.  Will need to practice steps again prior to d/c tomorrow.  Follow Up Recommendations  Home health PT     Does the patient have the potential to tolerate intense rehabilitation     Barriers to Discharge        Equipment Recommendations  Rolling walker with 5" wheels    Recommendations for Other Services    Frequency 7X/week   Progress towards PT Goals Progress towards PT goals: Progressing toward goals  Plan Current plan remains appropriate    Precautions / Restrictions Precautions Precautions: Fall Restrictions Weight Bearing Restrictions: Yes LLE Weight Bearing: Partial weight bearing   Pertinent Vitals/Pain Max L lower leg pain, premedicated, repositioned, elevated LE    Mobility  Bed Mobility Overal bed mobility: Needs Assistance Bed Mobility: Supine to Sit;Sit to Supine Supine to sit: HOB elevated;Supervision General bed mobility comments: verbal cues for technique, pt able to self assist today Transfers Overall transfer level: Needs assistance Equipment used: Rolling walker (2 wheeled) Transfers: Sit to/from Stand Sit to Stand: Min assist General transfer comment: verbal cues for safe technique, slight assist to stand required Ambulation/Gait Ambulation/Gait assistance: Min assist;Min guard Ambulation Distance (Feet): 50 Feet Assistive device: Rolling walker (2 wheeled) Gait Pattern/deviations: Step-to pattern Gait velocity: decr General Gait Details: verbal cues for sequence and PWB however pt unable to tolerate even PWB and maintained NWB for most of  ambulation; attempted weight but again no weight tolerated this morning due to pain Stairs: Yes Stairs assistance: Min assist Stair Management: Step to pattern;Two rails Number of Stairs: 2 (twice) General stair comments: demonstrated NWB technique as pt felt she would be unable to tolerate weight since she couldn't with ambulation, verbal cues for safe technique, min assist with first attempt and then progressed to min/guard    Exercises General Exercises - Lower Extremity Ankle Circles/Pumps: AROM;Both;15 reps Heel Slides: Left;15 reps;Other (comment);AAROM (x2) Straight Leg Raises: AAROM;Left;15 reps   PT Diagnosis:    PT Problem List:   PT Treatment Interventions:     PT Goals (current goals can now be found in the care plan section)    Visit Information  Last PT Received On: 08/09/13 Assistance Needed: +2 (for steps) History of Present Illness: s/p L tibial IM nail    Subjective Data      Cognition  Cognition Arousal/Alertness: Awake/alert Behavior During Therapy: WFL for tasks assessed/performed Overall Cognitive Status: Within Functional Limits for tasks assessed    Balance     End of Session PT - End of Session Equipment Utilized During Treatment: Gait belt Activity Tolerance: Patient limited by pain Patient left: in bed;with call bell/phone within reach   GP     Thoams Siefert,KATHrine E 08/09/2013, 11:54 AM Carmelia Bake, PT, DPT 08/09/2013 Pager: [phone]

## 2013-08-09 NOTE — Progress Notes (Signed)
   CARE MANAGEMENT NOTE 08/09/2013  Patient:  Kaitlyn Hays, Kaitlyn Hays   Account Number:  [id number]  Date Initiated:  08/08/2013  Documentation initiated by:  [facility]  Subjective/Objective Assessment:   INTRAMEDULLARY (IM) NAIL TIBIAL (Left)     Action/Plan:   HH   Anticipated DC Date:  08/10/2013   Anticipated DC Plan:  [place]  CM consult  Medication Assistance  PCP issues  [place] [facility]      [facility] Choice  HOME HEALTH   Choice offered to / List presented to:  C-1 Patient   DME arranged  3-N-1  Vassie Moselle      DME agency  [place] arranged  [place].   Status of service:  Completed, signed off Medicare Important Message given?   (If response is "NO", the following Medicare IM given date fields will be blank) Date Medicare IM given:   Date Additional Medicare IM given:    Discharge Disposition:  [place]  Per UR Regulation:    If discussed at Long Length of Stay Meetings, dates discussed:    Comments:  08/09/2013 1500 NCM attempted to call [facility] to arrange appt. Unable to arrange appt. NCM explained to call on Monday to arrange appt. Contact number for [facility] on dc instructions. Jonnie Finner RN CCM Case Mgmt phone [phone]  08/09/2013 1225 NCM spoke to pt and states she will stay at Grandmother's home for a week. States she lives on third floor of her apt complex. 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 Will be out of work for 6 to 8 weeks with injury. She does not have any FMLA available. Will provide pt with MATCH. Explained program can be used once per year with a $3.00 copay. Pt will be able to use benefit for pain medication s/p surgery pt qualify. [employer] [facility] notified for DME for home. Spoke to [facility] rep and they can service for [facility] PT. Jonnie Finner RN CCM Case Mgmt phone [phone]

## 2013-08-09 NOTE — Op Note (Signed)
Kaitlyn, Hays NO.:  [id number]  MEDICAL RECORD NO.:  [id number]  LOCATION:  2947                         FACILITY:  [facility]  PHYSICIAN:  Pietro Cassis. Alvan Dame, M.D.  DATE OF BIRTH:  [birth year]  DATE OF PROCEDURE:  08/07/2013 DATE OF DISCHARGE:                              OPERATIVE REPORT   PREOPERATIVE DIAGNOSIS:  Closed left tibia fracture.  POSTOPERATIVE DIAGNOSIS:  Closed left tibia fracture.  PROCEDURE:  Open reduction and internal fixation of left tibia fracture utilizing a Biomet Versa tibial nail, 9 mm x 375 mm, with the proximal and distal interlock.  SURGEON:  Pietro Cassis. Alvan Dame, M.D.  ASSISTANT:  Surgical Team.  ANESTHESIA:  General.  SPECIMENS:  None.  COMPLICATION:  None.  TOURNIQUET:  Tourniquet was utilized for a total of 10 minutes at 250 mmHg for preparation of the proximal tibia and was let down for reaming purposes.  COMPLICATIONS:  None apparent.  INDICATIONS FOR PROCEDURE:  Kaitlyn Hays is a 33 year old female, unfortunately slid earlier in the day of admission, injuring her left leg.  She was brought to the emergency room where radiographs revealed a complex minimally displaced tibia fracture, more in the lines of a spiral-type torsional injury.  When she was seen and evaluated, we discussed treatment options including casting which would require a long leg casting for 6 to 8 weeks, conversion to short leg casting versus operative intervention via intramedullary nailing.  Based on discussion, risks of nonunion, malunion, need for future surgeries, she wished to proceed with operative intervention versus the nonsurgical methods.  Consent was obtained for benefit of fracture management.  PROCEDURE IN DETAIL:  The patient was brought to the operative theater. Once adequate anesthesia, preoperative antibiotics, Ancef administered, she was positioned supine with a thigh tourniquet placed.  Left lower extremity was then prepped and  draped in a sterile fashion.  Time-out was performed, identifying the patient, planned procedure, and extremity.  Leg was exsanguinated, tourniquet elevated to 250 mmHg.  A paramidline incision was made just medial to the patella to the tubercle.  The retinacular layers just medial to the patellar were opened and the anterior intra-articular aspect of the joint preserved. A guidewire was then inserted into the anterior tibia to slip in the center portion.  It was confirmed radiographically in AP and lateral planes.  I then inserted the starting awl and with this, able to pass a ball-tipped guidewire, confirming its orientation into the proximal aspect of the tibia following down distally across the fracture pattern with it reduced and then to the middle portion of the ankle mortise and ankle plafond distally.  Once this was in its orientation, I tapped it into the physeal scar and then began preparing the tibia.  I reamed up from an 8-mm reamer up to a 10.5 mm reamer and selected a 9-mm nail.  We had measured the depth and selected a 37.5 cm nail.  The nail was then passed by hand without difficulty.  The guidewire was removed.  Once I was satisfied with orientation, I placed a single static locking screw proximally and a static locking screw through a perfect circle technique distally.  Final radiographs were obtained in  AP and lateral planes.  Fractures were reduced to an anatomic position, barely visible on AP plane, fractured lines visible in the lateral plane, but anatomically aligned.  The jig was removed proximally.  The proximal wound was irrigated with normal saline solution.  I reapproximated the retinacular tissues using #1 Vicryl.  The remaining of the wound was closed with 2-0 Vicryl and running 4-0 Monocryl.  The two static screw incisions were reapproximated with 2-0 Vicryl and then all skins were closed with Dermabond.  The two stab incisions were dressed with  Mepilex.  The proximal wound was dressed with an Aquacel dressing.  Once the dressings were placed, she was awoken from anesthesia and brought to the recovery room in stable condition, tolerating the procedure well.  I will make her partial weightbearing on this leg until healing, but she is going to be up with physical therapy.  She is going to be able to move her knee and her ankle, one of the proposed benefits of this type of procedure.  This will be stressed with her in the office to prevent any plantar contractures.  If there is any concerns about lack of movement due to pain, then we will need to fit her with an AFO and stress exercises.     Pietro Cassis Alvan Dame, M.D.     MDO/MEDQ  D:  08/08/2013  T:  08/08/2013  Job:  [id number]

## 2013-08-09 NOTE — Progress Notes (Signed)
   Subjective: 2 Days Post-Op Procedure(s) (LRB): INTRAMEDULLARY (IM) NAIL TIBIAL (Left)   Patient reports pain as moderate, no well controlled. Discussed changing medications. No events throughout the night.   Objective:   VITALS:   Filed Vitals:   08/09/13 0530  BP: 121/75  Pulse: 96  Temp: 99 F (37.2 C)  Resp: 16    Neurovascular intact No cellulitis present Compartment soft, no compartment syndrome observed  LABS  Recent Labs  08/07/13 1345 08/08/13 0355  HGB 8.2* 7.5*  HCT 27.5* 25.8*  WBC 9.9 11.5*  PLT 337 290     Recent Labs  08/07/13 1345 08/08/13 0355  NA 139 137  K 4.0 4.1  BUN 10 9  CREATININE 0.80 0.72  GLUCOSE 95 130*     Assessment/Plan: 2 Days Post-Op Procedure(s) (LRB): INTRAMEDULLARY (IM) NAIL TIBIAL (Left) Changed from Norco to Oxycodone Dr. Alvan Dame added 2 doses of toradol Up with therapy Discharge home with home health, eventually when ready    West Pugh. Vinny Taranto   PAC  08/09/2013, 8:34 AM

## 2013-08-09 NOTE — Progress Notes (Signed)
Physical Therapy Treatment Patient Details Name: Kaitlyn Hays MRN: [id number] DOB: 28-Jul-1980 Today's Date: 08/09/2013 Time: 5284-1324 PT Time Calculation (min): 28 min  PT Assessment / Plan / Recommendation  History of Present Illness s/p Kaitlyn tibial IM nail   PT Comments   Patient noted drowsiness upon arrival following nap after last therapy session due to tiredness. Patient was able to correctly perform all exercises and gait activities without minimal difficulty though patient continues to have pain and difficulty weight bearing on Kaitlyn LE. Patient was able to perform single leg ambulation with walker with with hop to pattern safely with contact guard assistance for safety. Patient's current discharge plan still appropriate.   Follow Up Recommendations  Home health PT           Equipment Recommendations  Rolling walker with 5" wheels       Frequency 7X/week   Progress towards PT Goals Progress towards PT goals: Progressing toward goals  Plan Current plan remains appropriate    Precautions / Restrictions Precautions Precautions: Fall Restrictions Weight Bearing Restrictions: Yes LLE Weight Bearing: Partial weight bearing LLE Partial Weight Bearing Percentage or Pounds: 50   Pertinent Vitals/Pain Pain 7/10 in Kaitlyn Hays    Mobility  Bed Mobility Overal bed mobility: Needs Assistance Bed Mobility: Supine to Sit;Sit to Supine Supine to sit: HOB elevated;Supervision General bed mobility comments: verbal cues for technique, pt able to self assist today Transfers Overall transfer level: Needs assistance Equipment used: Rolling walker (2 wheeled) Sit to Stand: Min assist General transfer comment: verbal cues for safe technique, slight assist to stand required Ambulation/Gait Assistive device: Rolling walker (2 wheeled) Gait velocity: decr General Gait Details: verbal cues for sequence and PWB however pt unable to tolerate even PWB and maintained NWB for most of ambulation;  attempted weight but again no weight tolerated this morning due to pain    Exercises Total Joint Exercises Ankle Circles/Pumps: AROM;Both;10 reps Quad Sets: AROM;Both;10 reps Heel Slides: AROM;15 reps;Left Hip ABduction/ADduction: AROM;Both;10 reps Straight Leg Raises: AROM;Left;10 reps General Exercises - Lower Extremity Ankle Circles/Pumps: AROM;Both;15 reps Heel Slides: Left;15 reps;Other (comment);AAROM (x2) Straight Leg Raises: AAROM;Left;15 reps     PT Goals (current goals can now be found in the care plan section) Acute Rehab PT Goals PT Goal Formulation: With patient Time For Goal Achievement: 08/15/13 Potential to Achieve Goals: Good  Visit Information  Last PT Received On: 08/09/13 Assistance Needed: +1 History of Present Illness: s/p Kaitlyn tibial IM nail    Subjective Data      Cognition  Cognition Arousal/Alertness: Awake/alert Behavior During Therapy: WFL for tasks assessed/performed Overall Cognitive Status: Within Functional Limits for tasks assessed    Balance  Balance Overall balance assessment: Modified Independent;Needs assistance Sitting-balance support: No upper extremity supported Sitting balance-Leahy Scale: Good Postural control: Posterior lean Standing balance support: Bilateral upper extremity supported Standing balance-Leahy Scale: Poor Standing balance comment: required bilateral UE support due to inability to bear weight on Kaitlyn LE secondary to pain  End of Session PT - End of Session Equipment Utilized During Treatment: Gait belt Activity Tolerance: Patient limited by pain Patient left: with call bell/phone within reach;in chair Nurse Communication: Mobility status (patient requested IV be looked at by nursing.)   GP     Kaitlyn Hays R 08/09/2013, 3:35 PM

## 2013-08-09 NOTE — Progress Notes (Signed)
[name], female   DOB: Feb 03, 1981, 33 y.o.   MRN: [id number]  Decided to add just 2 doses of toradol to try and help with pain control in addition to changing to oxycodone  Chronic anemia Plan to just to observe post op Hgb and treat with iron

## 2013-08-09 NOTE — Progress Notes (Signed)
   CARE MANAGEMENT NOTE 08/09/2013  Patient:  Kaitlyn Hays, Kaitlyn Hays   Account Number:  [id number]  Date Initiated:  08/08/2013  Documentation initiated by:  [facility]  Subjective/Objective Assessment:   INTRAMEDULLARY (IM) NAIL TIBIAL (Left)     Action/Plan:   HH   Anticipated DC Date:  08/10/2013   Anticipated DC Plan:  [place]  CM consult      [facility] Choice  HOME HEALTH   Choice offered to / List presented to:  C-1 Patient   DME arranged  3-N-1  Vassie Moselle      DME agency  [place] arranged  [place].   Status of service:  Completed, signed off Medicare Important Message given?   (If response is "NO", the following Medicare IM given date fields will be blank) Date Medicare IM given:   Date Additional Medicare IM given:    Discharge Disposition:  [place]  Per UR Regulation:    If discussed at Long Length of Stay Meetings, dates discussed:    Comments:  08/09/2013 1225 NCM spoke to pt and states she will stay at Grandmother's home for a week. States she lives on third floor of her apt complex. 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 Will be out of work for 6 to 8 weeks with injury. She does not have any FMLA available. Will provide pt with MATCH. Explained program can be used once per year with a $3.00 copay. Pt will be able to use benefit for pain medication s/p surgery pts qualify. [place] notified for DME for home. Spoke to [facility] rep and they can service for [facility]. Jonnie Finner RN CCM Case Mgmt phone [phone]

## 2013-08-10 LAB — ABO/RH: ABO/RH(D): O POS

## 2013-08-10 LAB — PREPARE RBC (CROSSMATCH)

## 2013-08-10 MED ORDER — FUROSEMIDE 10 MG/ML IJ SOLN
20.0000 mg | Freq: Once | INTRAMUSCULAR | Status: AC
  Administered 2013-08-10: 20 mg via INTRAVENOUS
  Filled 2013-08-10: qty 2

## 2013-08-10 NOTE — Progress Notes (Signed)
PT Cancellation Note  Patient Details Name: Kaitlyn Hays MRN: [id number] DOB: Oct 09, 1980   Cancelled Treatment:    Reason Eval/Treat Not Completed: Medical issues which prohibited therapylo hgb - getting 2 units.   Claretha Cooper 08/10/2013, 11:14 AM

## 2013-08-10 NOTE — Progress Notes (Signed)
Subjective: Pain controlled but c/o feeling fatigued, weak, tired when up.  No complaints of cp, sob.    Objective: Vital signs in last 24 hours: Temp:  [98.3 F (36.8 C)-99.3 F (37.4 C)] 99 F (37.2 C) (02/28 0611) Pulse Rate:  [85-113] 100 (02/28 0611) Resp:  [14-16] 16 (02/28 0611) BP: (120-150)/(75-82) 124/82 mmHg (02/28 0611) SpO2:  [97 %-99 %] 97 % (02/28 0611)  Intake/Output from previous day: 02/27 0701 - 02/28 0700 In: 720 [P.O.:420; I.V.:300] Out: 650 [Urine:650] Intake/Output this shift: Total I/O In: -  Out: 750 [Urine:750]   Recent Labs  08/07/13 1345 08/08/13 0355  HGB 8.2* 7.5*    Recent Labs  08/07/13 1345 08/08/13 0355  WBC 9.9 11.5*  RBC 4.29 4.01  HCT 27.5* 25.8*  PLT 337 290    Recent Labs  08/07/13 1345 08/08/13 0355  NA 139 137  K 4.0 4.1  CL 103 101  CO2 25 24  BUN 10 9  CREATININE 0.80 0.72  GLUCOSE 95 130*  CALCIUM 9.7 9.3    Recent Labs  08/07/13 1345  INR 1.06    Dressing c/d/i.  Calf nt, nvi.  No signs of infection.   Assessment/Plan: Symptomatic ABLA.  Will transfuse 2 units prbc's.  Anticipate d/c home tomorrow.     Kaitlyn Hays M 08/10/2013, 11:09 AM

## 2013-08-11 LAB — CBC
HEMATOCRIT: 31.5 % — AB (ref 36.0–46.0)
Hemoglobin: 9.9 g/dL — ABNORMAL LOW (ref 12.0–15.0)
MCH: 21.3 pg — AB (ref 26.0–34.0)
MCHC: 31.4 g/dL (ref 30.0–36.0)
MCV: 67.7 fL — AB (ref 78.0–100.0)
Platelets: 275 10*3/uL (ref 150–400)
RBC: 4.65 MIL/uL (ref 3.87–5.11)
RDW: 20.1 % — ABNORMAL HIGH (ref 11.5–15.5)
WBC: 8.7 10*3/uL (ref 4.0–10.5)

## 2013-08-11 NOTE — Progress Notes (Addendum)
Subjective: 4 Days Post-Op Procedure(s) (LRB): INTRAMEDULLARY (IM) NAIL TIBIAL (Left) Patient reports pain as 4 on 0-10 scale.   No CP SOB Objective: Vital signs in last 24 hours: Temp:  [98 F (36.7 C)-99.4 F (37.4 C)] 98.2 F (36.8 C) (03/01 0540) Pulse Rate:  [76-103] 76 (03/01 0540) Resp:  [16-20] 16 (03/01 0540) BP: (123-143)/(69-85) 123/83 mmHg (03/01 0540) SpO2:  [100 %] 100 % (03/01 0540)  Intake/Output from previous day: 02/28 0701 - 03/01 0700 In: 1380 [P.O.:360; I.V.:280; Blood:740] Out: 3450 [Urine:3450] Intake/Output this shift:    No results found for this basename: HGB,  in the last 72 hours No results found for this basename: WBC, RBC, HCT, PLT,  in the last 72 hours No results found for this basename: NA, K, CL, CO2, BUN, CREATININE, GLUCOSE, CALCIUM,  in the last 72 hours No results found for this basename: LABPT, INR,  in the last 72 hours  Neurologically intact Neurovascular intact Intact pulses distally Incision: dressing C/D/I Compartments soft. Assessment/Plan: 4 Days Post-Op Procedure(s) (LRB): INTRAMEDULLARY (IM) NAIL TIBIAL (Left) Up with therapy Check H/H Probable D/C  Leith Hedlund C 08/11/2013, 7:48 AM

## 2013-08-11 NOTE — Progress Notes (Signed)
Physical Therapy Treatment Patient Details Name: Kaitlyn Hays MRN: [id number] DOB: 1980-11-04 Today's Date: 08/11/2013 Time: 4166-0630 PT Time Calculation (min): 25 min  PT Assessment / Plan / Recommendation  History of Present Illness s/p L tibial IM nail   PT Comments   **Stair training completed. Pt walked 41' with RW, distance limited by pain. She is still unable to tolerate PWB on LLE 2* pain, she walked NWB LLE.  Verbally reviewed exercises, pt stated she couldn't perform them now due to pain. *  Follow Up Recommendations  Home health PT     Does the patient have the potential to tolerate intense rehabilitation     Barriers to Discharge        Equipment Recommendations  Rolling walker with 5" wheels    Recommendations for Other Services    Frequency 7X/week   Progress towards PT Goals Progress towards PT goals: Progressing toward goals  Plan Current plan remains appropriate    Precautions / Restrictions Precautions Precautions: Fall Restrictions Weight Bearing Restrictions: Yes LLE Weight Bearing: Partial weight bearing LLE Partial Weight Bearing Percentage or Pounds: 50   Pertinent Vitals/Pain **9/10 LLE with activity Premedicated Ice applied*    Mobility  Transfers Overall transfer level: Needs assistance Equipment used: Rolling walker (2 wheeled) Sit to Stand: Min guard General transfer comment: verbal cues for safe technique, min/guard for balance 2* pt not able to tolerate WB on LLE Ambulation/Gait Ambulation Distance (Feet): 65 Feet Assistive device: Rolling walker (2 wheeled) Gait Pattern/deviations: Step-to pattern;Antalgic Gait velocity: decr Gait velocity interpretation: Below normal speed for age/gender General Gait Details: verbal cues for sequence and PWB however pt unable to tolerate even PWB and maintained NWB for most of ambulation; distance limited by pain Stairs: Yes Stairs assistance: Min guard Stair Management: Two rails;Step to  pattern;Forwards Number of Stairs: 3    Exercises Total Joint Exercises Ankle Circles/Pumps: AROM;10 reps;Supine   PT Diagnosis:    PT Problem List:   PT Treatment Interventions:     PT Goals (current goals can now be found in the care plan section) Acute Rehab PT Goals PT Goal Formulation: With patient Time For Goal Achievement: 08/15/13 Potential to Achieve Goals: Good  Visit Information  Last PT Received On: 08/11/13 Assistance Needed: +1 History of Present Illness: s/p L tibial IM nail    Subjective Data      Cognition  Cognition Arousal/Alertness: Awake/alert Behavior During Therapy: WFL for tasks assessed/performed Overall Cognitive Status: Within Functional Limits for tasks assessed    Balance  Balance Sitting balance-Leahy Scale: Good Standing balance-Leahy Scale: Poor Standing balance comment: BUE support required 2* unable to tolerate WB on LLE 2* pain  End of Session PT - End of Session Equipment Utilized During Treatment: Gait belt Activity Tolerance: Patient limited by pain Patient left: with call bell/phone within reach;in chair Nurse Communication: Mobility status (patient requested IV be looked at by nursing.)   GP     Philomena Doheny 08/11/2013, 11:06 AM [phone]

## 2013-08-12 LAB — TYPE AND SCREEN
ABO/RH(D): O POS
ANTIBODY SCREEN: NEGATIVE
UNIT DIVISION: 0
Unit division: 0

## 2013-08-30 NOTE — Discharge Summary (Signed)
Physician Discharge Summary  [name] MRN: [id number] DOB/AGE: Jun 07, 1981 33 y.o.  Admit date: 08/07/2013 Discharge date: 08/11/2013   Procedures:  Procedure(s) (LRB): INTRAMEDULLARY (IM) NAIL TIBIAL (Left)  Attending Physician:  Dr. Paralee Cancel   Admission Diagnoses:   Left tibia fracture  Discharge Diagnoses:  Active Problems:   Tibia fracture  Past Medical History  Diagnosis Date  . Anemia   . Fibroids     HPI: 33 yo otherwise healthy female slipped and fell today taking out the trash. Immediate pain left leg. Transported to [facility] ER. Evaluation revealed left tibia fracture.  PCP: Andrena Mews, MD   Discharged Condition: good  Hospital Course:  Patient was admitted to the hospital on 08/07/2013 through the ER.  She underwent the above stated procedure on 08/08/2013. Patient tolerated the procedure well and brought to the recovery room in good condition and subsequently to the floor.  POD #1 BP: 121/75 ; Pulse: 96 ; Temp: 99 F (37.2 C) ; Resp: 16 Patient reports pain as moderate, no well controlled. Discussed changing medications. No events throughout the night.  Neurovascular intact, dorsiflexion/plantar flexion intact, incision: dressing C/D/I, no cellulitis present and compartment soft.   POD #2  BP: 124/82 ; Pulse: 100 ; Temp: 99 F (37.2 C) ; Resp: 16 Pain controlled but c/o feeling fatigued, weak, tired when up. No complaints of cp, sob.  Transfused with 2 units of blood. Dressing c/d/i. Calf nt, nvi. No signs of infection.   POD #3  BP: 123/83 ; Pulse: 76 ; Temp: 98.2 F (36.8 C) ; Resp: 16 Patient reports pain as 4 on 0-10 scale. No CP SOB. Neurovascular intact, dorsiflexion/plantar flexion intact, incision: dressing C/D/I, no cellulitis present and compartment soft.    Discharge Exam: General appearance: alert, cooperative and no distress Extremities: Homans sign is negative, no sign of DVT, no edema, redness or tenderness in the  calves or thighs and no ulcers, gangrene or trophic changes  Disposition:    Home-Health Care Svc with follow up in 2 weeks   Follow-up Information   Follow up with Mauri Pole, MD. Schedule an appointment as soon as possible for a visit in 2 weeks.   Specialty:  Orthopedic Surgery   Contact information:   [address] [place] [address] [phone]       Follow up with [place])    Contact information:   [address] [place] [address] [phone]       Follow up with [place]    . (please call to arrange appointment)    Contact information:   [place]  [phone]      Discharge Orders   Future Orders Complete By Expires   Call MD / Call 911  As directed    Comments:     If you experience chest pain or shortness of breath, CALL 911 and be transported to the hospital emergency room.  If you develope a fever above 101 F, pus (white drainage) or increased drainage or redness at the wound, or calf pain, call your surgeon's office.   Call MD / Call 911  As directed    Comments:     If you experience chest pain or shortness of breath, CALL 911 and be transported to the hospital emergency room.  If you develope a fever above 101 F, pus (white drainage) or increased drainage  or redness at the wound, or calf pain, call your surgeon's office.   Constipation Prevention  As directed    Comments:     Drink plenty of fluids.  Prune juice may be helpful.  You may use a stool softener, such as Colace (over the counter) 100 mg twice a day.  Use MiraLax (over the counter) for constipation as needed.   Constipation Prevention  As directed    Comments:     Drink plenty of fluids.  Prune juice may be helpful.  You may use a stool softener, such as Colace (over the counter) 100 mg twice a day.  Use MiraLax (over the counter) for  constipation as needed.   Diet - low sodium heart healthy  As directed    Diet - low sodium heart healthy  As directed    Discharge instructions  As directed    Comments:     Maintain surgical dressing for 10-14 days, or until follow up in the clinic. Follow up in 2 weeks at [facility]. Call with any questions or concerns.   Increase activity slowly as tolerated  As directed    Partial weight bearing  As directed    Questions:     % Body Weight:  50   Laterality:  left   Extremity:  Lower        Medication List    STOP taking these medications       MUCINEX FAST-MAX COLD FLU 5-10-200-325 MG/10ML Liqd  Generic drug:  Phenylephrine-DM-GG-APAP      TAKE these medications       acetaminophen 500 MG tablet  Commonly known as:  TYLENOL  Take 1,000 mg by mouth every 6 (six) hours as needed for mild pain.     albuterol 108 (90 BASE) MCG/ACT inhaler  Commonly known as:  PROVENTIL HFA;VENTOLIN HFA  Inhale 1-2 puffs into the lungs every 6 (six) hours as needed for wheezing or shortness of breath.     DSS 100 MG Caps  Take 100 mg by mouth 2 (two) times daily.     enoxaparin 40 MG/0.4ML injection  Commonly known as:  LOVENOX  Inject 0.4 mLs (40 mg total) into the skin daily.     methocarbamol 500 MG tablet  Commonly known as:  ROBAXIN  Take 1 tablet (500 mg total) by mouth every 6 (six) hours as needed for muscle spasms.     oxyCODONE 5 MG immediate release tablet  Commonly known as:  Oxy IR/ROXICODONE  Take 1-3 tablets (5-15 mg total) by mouth every 4 (four) hours as needed for severe pain.     polyethylene glycol packet  Commonly known as:  MIRALAX / GLYCOLAX  Take 17 g by mouth daily as needed for mild constipation.         Signed: West Pugh. Amay Mijangos   PAC  08/30/2013, 4:07 PM

## 2013-11-18 ENCOUNTER — Emergency Department (HOSPITAL_COMMUNITY)
Admission: EM | Admit: 2013-11-18 | Discharge: 2013-11-19 | Disposition: A | Discharge status: Home / Self Care | Payer: Self-pay | Attending: Emergency Medicine | Admitting: Emergency Medicine

## 2013-11-18 DIAGNOSIS — N920 Excessive and frequent menstruation with regular cycle: Secondary | ICD-10-CM

## 2013-11-18 DIAGNOSIS — D649 Anemia, unspecified: Secondary | ICD-10-CM | POA: Insufficient documentation

## 2013-11-18 DIAGNOSIS — N923 Ovulation bleeding: Secondary | ICD-10-CM

## 2013-11-18 DIAGNOSIS — N760 Acute vaginitis: Secondary | ICD-10-CM | POA: Insufficient documentation

## 2013-11-18 DIAGNOSIS — N921 Excessive and frequent menstruation with irregular cycle: Secondary | ICD-10-CM | POA: Insufficient documentation

## 2013-11-18 DIAGNOSIS — Z79899 Other long term (current) drug therapy: Secondary | ICD-10-CM | POA: Insufficient documentation

## 2013-11-18 DIAGNOSIS — Z3202 Encounter for pregnancy test, result negative: Secondary | ICD-10-CM | POA: Insufficient documentation

## 2013-11-18 DIAGNOSIS — R112 Nausea with vomiting, unspecified: Secondary | ICD-10-CM

## 2013-11-18 DIAGNOSIS — A499 Bacterial infection, unspecified: Secondary | ICD-10-CM | POA: Insufficient documentation

## 2013-11-18 DIAGNOSIS — B9689 Other specified bacterial agents as the cause of diseases classified elsewhere: Secondary | ICD-10-CM

## 2013-11-19 ENCOUNTER — Encounter (HOSPITAL_COMMUNITY): Payer: Self-pay | Admitting: Emergency Medicine

## 2013-11-19 LAB — I-STAT CHEM 8, ED
BUN: 12 mg/dL (ref 6–23)
Calcium, Ion: 1.23 mmol/L (ref 1.12–1.23)
Chloride: 102 mEq/L (ref 96–112)
Creatinine, Ser: 0.8 mg/dL (ref 0.50–1.10)
Glucose, Bld: 92 mg/dL (ref 70–99)
HCT: 29 % — ABNORMAL LOW (ref 36.0–46.0)
Hemoglobin: 9.9 g/dL — ABNORMAL LOW (ref 12.0–15.0)
Potassium: 3.5 mEq/L — ABNORMAL LOW (ref 3.7–5.3)
Sodium: 141 mEq/L (ref 137–147)
TCO2: 25 mmol/L (ref 0–100)

## 2013-11-19 LAB — POC URINE PREG, ED: Preg Test, Ur: NEGATIVE

## 2013-11-19 LAB — URINALYSIS, ROUTINE W REFLEX MICROSCOPIC
BILIRUBIN URINE: NEGATIVE
GLUCOSE, UA: NEGATIVE mg/dL
KETONES UR: NEGATIVE mg/dL
Leukocytes, UA: NEGATIVE
Nitrite: NEGATIVE
PROTEIN: NEGATIVE mg/dL
Specific Gravity, Urine: 1.028 (ref 1.005–1.030)
UROBILINOGEN UA: 1 mg/dL (ref 0.0–1.0)
pH: 7 (ref 5.0–8.0)

## 2013-11-19 LAB — WET PREP, GENITAL
Trich, Wet Prep: NONE SEEN
Yeast Wet Prep HPF POC: NONE SEEN

## 2013-11-19 LAB — HIV ANTIBODY (ROUTINE TESTING W REFLEX): HIV 1&2 Ab, 4th Generation: NONREACTIVE

## 2013-11-19 LAB — URINE MICROSCOPIC-ADD ON

## 2013-11-19 LAB — GC/CHLAMYDIA PROBE AMP
CT Probe RNA: NEGATIVE
GC Probe RNA: NEGATIVE

## 2013-11-19 LAB — RPR

## 2013-11-19 MED ORDER — NAPROXEN 500 MG PO TABS: 500.0000 mg | ORAL_TABLET | Freq: Two times a day (BID) | ORAL | Status: DC

## 2013-11-19 MED ORDER — METRONIDAZOLE 500 MG PO TABS: 500.0000 mg | ORAL_TABLET | Freq: Two times a day (BID) | ORAL | Status: DC

## 2013-11-19 MED ORDER — PROMETHAZINE HCL 25 MG PO TABS: 25.0000 mg | ORAL_TABLET | Freq: Four times a day (QID) | ORAL | Status: DC | PRN

## 2013-11-19 MED ORDER — ONDANSETRON 4 MG PO TBDP
4.0000 mg | ORAL_TABLET | Freq: Once | ORAL | Status: AC
  Administered 2013-11-19: 4 mg via ORAL
  Filled 2013-11-19: qty 1

## 2013-11-19 NOTE — ED Notes (Signed)
Pelvic cart ready at bedside 

## 2013-11-19 NOTE — ED Notes (Signed)
Pt c/o nausea for "a couple of days" and vomiting last night. Once back in room, pt also sts that she has had blood tinged vaginal discharge for "a couple of days." Pt sts she has had unprotected sex with a new partner and may be pregnant or may have an STD. Pt would like visitor removed from room when speaking with doctor or nurse. Pt denies any pain, urinary changes. A&Ox4.

## 2013-11-19 NOTE — ED Notes (Signed)
Pt did well with fluid challenge. 

## 2013-11-19 NOTE — ED Provider Notes (Signed)
Medical screening examination/treatment/procedure(s) were performed by non-physician practitioner and as supervising physician I was immediately available for consultation/collaboration.   EKG Interpretation None        Franshesca Chipman M Camryn Quesinberry, MD 11/19/13 0741 

## 2013-11-19 NOTE — ED Provider Notes (Signed)
CSN: [id number]     Arrival date & time 11/18/13  2303 History   First MD Initiated Contact with Patient 11/19/13 0234     Chief Complaint  Patient presents with  . Emesis     (Consider location/radiation/quality/duration/timing/severity/associated sxs/prior Treatment) HPI Pt is a 33yo female with hx of anemia and fibroids c/o nausea and vomiting associated with loose stools. Pt reports feeling nauseated x2 days, reports vomiting around 6PM this evening with 2 episodes of non-bloody, non-bilious emesis.  Denies abdominal pain. Reports loose stools that started today as well but denies blood or mucous in stool. Pt also reports intermittent vaginal discharge x2 days that is bright red then brown, "like spotting" but reports LMP 2 weeks ago. States she is not always regular. Also reports being sexually active but does not use condoms or birth control. Pt wants to be checked for STDs "just in case" as she has had unprotected sex with a new partner.  Denies specific contact with known STD.  Denies fever. Denies urinary symptoms or back pain. No sick contacts or recent travel. No hx of abdominal surgeries. Pt has not taken any medication PTA.  Nothing makes symptoms better or worse.  Past Medical History  Diagnosis Date  . Anemia   . Fibroids    Past Surgical History  Procedure Laterality Date  . No past surgeries    . Tibia im nail insertion Left 08/07/2013    Procedure: INTRAMEDULLARY (IM) NAIL TIBIAL;  Surgeon: Mauri Pole, MD;  Location: [facility];  Service: Orthopedics;  Laterality: Left;   Family History  Problem Relation Age of Onset  . Hypertension Other    History  Substance Use Topics  . Smoking status: Never Smoker   . Smokeless tobacco: Never Used  . Alcohol Use: No   OB History   Grav Para Term Preterm Abortions TAB SAB Ect Mult Living                 Review of Systems  Constitutional: Positive for appetite change. Negative for fever, chills and fatigue.  HENT: Negative  for congestion.   Respiratory: Negative for cough and shortness of breath.   Cardiovascular: Negative for chest pain and palpitations.  Gastrointestinal: Positive for nausea and vomiting. Negative for abdominal pain, diarrhea ( "loose stool"), constipation and blood in stool.  Genitourinary: Positive for vaginal discharge ( brown-red discharge) and menstrual problem ( spotting?). Negative for dysuria, urgency, frequency, hematuria, flank pain, decreased urine volume, genital sores, vaginal pain and pelvic pain.  Musculoskeletal: Negative for back pain and myalgias.  All other systems reviewed and are negative.     Allergies  Review of patient's allergies indicates no known allergies.  Home Medications   Prior to Admission medications   Medication Sig Start Date End Date Taking? Authorizing Provider  aspirin 325 MG EC tablet Take 325 mg by mouth every 6 (six) hours as needed for pain (pain).   Yes Historical Provider, MD  ferrous sulfate (SLOW IRON) 160 (50 FE) MG TBCR SR tablet Take 1 tablet by mouth daily. OTC   Yes Historical Provider, MD  metroNIDAZOLE (FLAGYL) 500 MG tablet Take 1 tablet (500 mg total) by mouth 2 (two) times daily. 11/19/13   Noland Fordyce, PA-C  naproxen (NAPROSYN) 500 MG tablet Take 1 tablet (500 mg total) by mouth 2 (two) times daily. 11/19/13   Noland Fordyce, PA-C  promethazine (PHENERGAN) 25 MG tablet Take 1 tablet (25 mg total) by mouth every 6 (six) hours as  needed for nausea or vomiting. 11/19/13   Noland Fordyce, PA-C   BP 121/69  Pulse 93  Temp(Src) 99 F (37.2 C) (Oral)  Resp 16  Ht 6' (1.829 m)  Wt 192 lb (87.091 kg)  BMI 26.03 kg/m2  SpO2 100%  LMP 11/04/2013 Physical Exam  Nursing note and vitals reviewed. Constitutional: She appears well-developed and well-nourished. No distress.  Pt sleeping in exam room, easily awakened. NAD. Appears well, non-toxic.  HENT:  Head: Normocephalic and atraumatic.  Eyes: Conjunctivae are normal. No scleral icterus.   Neck: Normal range of motion.  Cardiovascular: Normal rate, regular rhythm and normal heart sounds.   Pulmonary/Chest: Effort normal and breath sounds normal. No respiratory distress. She has no wheezes. She has no rales. She exhibits no tenderness.  No respiratory distress, able to speak in full sentences w/o difficulty. Lungs :CTAB  Abdominal: Soft. Bowel sounds are normal. She exhibits no distension and no mass. There is no tenderness. There is no rebound and no guarding.  Soft, non-distended, non-tender. No CVAT   Genitourinary: Uterus normal. Pelvic exam was performed with patient supine. No labial fusion. There is no rash, tenderness, lesion or injury on the right labia. There is no rash, tenderness, lesion or injury on the left labia. Cervix exhibits no motion tenderness and no friability. Discharge:  small amount yellow discharge with scant red blood. Right adnexum displays no mass, no tenderness and no fullness. Left adnexum displays no mass, no tenderness and no fullness. There is bleeding around the vagina. No erythema or tenderness around the vagina. No foreign body around the vagina. No signs of injury around the vagina. Vaginal discharge found.  Chaperoned exam. Normal external genital exam. Vaginal and cervical discharge: small amount of yellow discharge with scant amount of bright red blood. No CMT, adnexal tenderness or masses.  Musculoskeletal: Normal range of motion.  Neurological: She is alert.  Skin: Skin is warm and dry. She is not diaphoretic.    ED Course  Procedures (including critical care time) Labs Review Labs Reviewed  WET PREP, GENITAL - Abnormal; Notable for the following:    Clue Cells Wet Prep HPF POC MANY (*)    WBC, Wet Prep HPF POC FEW (*)    All other components within normal limits  URINALYSIS, ROUTINE W REFLEX MICROSCOPIC - Abnormal; Notable for the following:    Hgb urine dipstick MODERATE (*)    All other components within normal limits  I-STAT CHEM  8, ED - Abnormal; Notable for the following:    Potassium 3.5 (*)    Hemoglobin 9.9 (*)    HCT 29.0 (*)    All other components within normal limits  GC/CHLAMYDIA PROBE AMP  URINE MICROSCOPIC-ADD ON  RPR  HIV ANTIBODY (ROUTINE TESTING)  POC URINE PREG, ED    Imaging Review No results found.   EKG Interpretation None      MDM   Final diagnoses:  Nausea & vomiting  Spotting between menses  Bacterial vaginosis   Pt is a 33yo female c/o nausea, vomiting and vaginal spotting for "a couple of days" pt also requesting STD testing and she has had unprotected intercourse with a new partner.  Pt appears well, nontoxic. Abd-not concerning for surgical abdomen.  Pregnancy test: negative.  Not concerned for SBO, ovarian torsion, ectopic pregnancy or other emergent process at this time. Wet prep: many clue cells.  Will tx with flagyl for BV.  Discussed resulted with pt who agrees with tx plan. Advised pt other  test results will take several days. Advised to f/u with women's clinic as needed for continued spotting.  Return precautions provided. Pt verbalized understanding and agreement with tx plan.   Noland Fordyce, PA-C 11/19/13 [phone]

## 2013-11-19 NOTE — ED Notes (Signed)
Patient is alert and oriented x3.  She is complaining of vomiting that started around 6pm Patient states that she has not felt well all weekend and started vomiting today.  Currently she denies any pain but continues to have nausea.

## 2014-04-10 ENCOUNTER — Encounter (HOSPITAL_COMMUNITY): Payer: Self-pay | Admitting: Emergency Medicine

## 2014-04-10 ENCOUNTER — Emergency Department (HOSPITAL_COMMUNITY)
Admission: EM | Admit: 2014-04-10 | Discharge: 2014-04-10 | Disposition: A | Discharge status: Home / Self Care | Payer: Self-pay | Attending: Emergency Medicine | Admitting: Emergency Medicine

## 2014-04-10 DIAGNOSIS — Z8742 Personal history of other diseases of the female genital tract: Secondary | ICD-10-CM | POA: Insufficient documentation

## 2014-04-10 DIAGNOSIS — K088 Other specified disorders of teeth and supporting structures: Secondary | ICD-10-CM | POA: Insufficient documentation

## 2014-04-10 DIAGNOSIS — Z3202 Encounter for pregnancy test, result negative: Secondary | ICD-10-CM | POA: Insufficient documentation

## 2014-04-10 DIAGNOSIS — N760 Acute vaginitis: Secondary | ICD-10-CM | POA: Insufficient documentation

## 2014-04-10 DIAGNOSIS — N390 Urinary tract infection, site not specified: Secondary | ICD-10-CM

## 2014-04-10 DIAGNOSIS — Z7982 Long term (current) use of aspirin: Secondary | ICD-10-CM | POA: Insufficient documentation

## 2014-04-10 DIAGNOSIS — K0889 Other specified disorders of teeth and supporting structures: Secondary | ICD-10-CM

## 2014-04-10 DIAGNOSIS — Z791 Long term (current) use of non-steroidal anti-inflammatories (NSAID): Secondary | ICD-10-CM | POA: Insufficient documentation

## 2014-04-10 DIAGNOSIS — B9689 Other specified bacterial agents as the cause of diseases classified elsewhere: Secondary | ICD-10-CM

## 2014-04-10 DIAGNOSIS — Z792 Long term (current) use of antibiotics: Secondary | ICD-10-CM | POA: Insufficient documentation

## 2014-04-10 DIAGNOSIS — D649 Anemia, unspecified: Secondary | ICD-10-CM | POA: Insufficient documentation

## 2014-04-10 LAB — URINE MICROSCOPIC-ADD ON

## 2014-04-10 LAB — WET PREP, GENITAL
TRICH WET PREP: NONE SEEN
Yeast Wet Prep HPF POC: NONE SEEN

## 2014-04-10 LAB — URINALYSIS, ROUTINE W REFLEX MICROSCOPIC
BILIRUBIN URINE: NEGATIVE
Glucose, UA: NEGATIVE mg/dL
Ketones, ur: NEGATIVE mg/dL
NITRITE: NEGATIVE
PH: 7.5 (ref 5.0–8.0)
Protein, ur: NEGATIVE mg/dL
SPECIFIC GRAVITY, URINE: 1.023 (ref 1.005–1.030)
UROBILINOGEN UA: 0.2 mg/dL (ref 0.0–1.0)

## 2014-04-10 LAB — POC URINE PREG, ED: Preg Test, Ur: NEGATIVE

## 2014-04-10 MED ORDER — CEFTRIAXONE SODIUM 250 MG IJ SOLR
250.0000 mg | Freq: Once | INTRAMUSCULAR | Status: AC
  Administered 2014-04-10: 250 mg via INTRAMUSCULAR
  Filled 2014-04-10: qty 250

## 2014-04-10 MED ORDER — AZITHROMYCIN 250 MG PO TABS
1000.0000 mg | ORAL_TABLET | Freq: Once | ORAL | Status: AC
  Administered 2014-04-10: 1000 mg via ORAL
  Filled 2014-04-10: qty 4

## 2014-04-10 MED ORDER — METRONIDAZOLE 500 MG PO TABS: 500.0000 mg | ORAL_TABLET | Freq: Two times a day (BID) | ORAL | Status: DC

## 2014-04-10 MED ORDER — CEPHALEXIN 500 MG PO CAPS: 1000.0000 mg | ORAL_CAPSULE | Freq: Two times a day (BID) | ORAL | Status: DC

## 2014-04-10 MED ORDER — LIDOCAINE HCL 1 % IJ SOLN
INTRAMUSCULAR | Status: AC
  Administered 2014-04-10: 20 mL
  Filled 2014-04-10: qty 20

## 2014-04-10 MED ORDER — FLUCONAZOLE 150 MG PO TABS: 150.0000 mg | ORAL_TABLET | Freq: Once | ORAL | Status: DC

## 2014-04-10 NOTE — ED Provider Notes (Signed)
CSN: [id number]     Arrival date & time 04/10/14  1829 History   First MD Initiated Contact with Patient 04/10/14 1920     Chief Complaint  Patient presents with  . Dysuria  . Vaginal Discharge  . Dental Pain     (Consider location/radiation/quality/duration/timing/severity/associated sxs/prior Treatment) HPI  33 year old female with hx of anemia and fibroids presents complaining of dysuria and vaginal discharge. Patient reports since this morning she noticed some mild urinary discomfort with urinary frequency and urgency. She also noticed some mild vaginal discharge and an odor.  Report having vaginal irritation as well. Vaginal irritation has been ongoing for the past 2 days. No complaint of abdominal pain, vaginal bleeding, hematuria, or rash. Remote history of Trichomonas 2 years ago but no other STD. She is sexually active with one partner. Patient also complaining of intermittent left lower molar dental pain for the past month. Pain is intermittent and is not actively bothering her. She requests for dental referral.  Her LMP 2 weeks ago.    Past Medical History  Diagnosis Date  . Anemia   . Fibroids    Past Surgical History  Procedure Laterality Date  . No past surgeries    . Tibia im nail insertion Left 08/07/2013    Procedure: INTRAMEDULLARY (IM) NAIL TIBIAL;  Surgeon: Mauri Pole, MD;  Location: [facility];  Service: Orthopedics;  Laterality: Left;   Family History  Problem Relation Age of Onset  . Hypertension Other    History  Substance Use Topics  . Smoking status: Never Smoker   . Smokeless tobacco: Never Used  . Alcohol Use: No   OB History   Grav Para Term Preterm Abortions TAB SAB Ect Mult Living                 Review of Systems  All other systems reviewed and are negative.     Allergies  Review of patient's allergies indicates no known allergies.  Home Medications   Prior to Admission medications   Medication Sig Start Date End Date Taking?  Authorizing Provider  aspirin 325 MG EC tablet Take 325 mg by mouth every 6 (six) hours as needed for pain (pain).    Historical Provider, MD  ferrous sulfate (SLOW IRON) 160 (50 FE) MG TBCR SR tablet Take 1 tablet by mouth daily. OTC    Historical Provider, MD  metroNIDAZOLE (FLAGYL) 500 MG tablet Take 1 tablet (500 mg total) by mouth 2 (two) times daily. 11/19/13   Noland Fordyce, PA-C  naproxen (NAPROSYN) 500 MG tablet Take 1 tablet (500 mg total) by mouth 2 (two) times daily. 11/19/13   Noland Fordyce, PA-C  promethazine (PHENERGAN) 25 MG tablet Take 1 tablet (25 mg total) by mouth every 6 (six) hours as needed for nausea or vomiting. 11/19/13   Noland Fordyce, PA-C   BP 126/68  Pulse 94  Temp(Src) 98.2 F (36.8 C) (Oral)  Resp 17  SpO2 100% Physical Exam  Nursing note and vitals reviewed. Constitutional: She appears well-developed and well-nourished. No distress.  HENT:  Head: Normocephalic and atraumatic.  Mouth:  Dental decay noted to tooth #17, no gingival erythema, minimal tenderness, no abscess no trismus.   Eyes: Conjunctivae are normal.  Neck: Normal range of motion. Neck supple.  Cardiovascular: Normal rate and regular rhythm.   Pulmonary/Chest: Effort normal and breath sounds normal. She exhibits no tenderness.  Abdominal: Soft. There is no tenderness.  Genitourinary: Uterus normal. There is no rash or lesion on  the right labia. There is no rash or lesion on the left labia. Cervix exhibits no motion tenderness and no discharge. Right adnexum displays no mass and no tenderness. Left adnexum displays no mass and no tenderness. No erythema, tenderness or bleeding around the vagina. Vaginal discharge found.  Chaperone present:  Lymphadenopathy:       Right: No inguinal adenopathy present.       Left: No inguinal adenopathy present.    ED Course  Procedures (including critical care time)  7:34 PM Patient here with dysuria, and vaginal discharge. Will perform pelvic examination for  further evaluation. She also reports intermittent dental pain. She has evidence of dental decay to the tooth #17. We'll give dental referral  10:01 PM UA with evidence of urinary tract infection, will treat with antibiotic. Wet prep shows shows evidence of many clue cells consistent with BV, will discharge with Flagyl. Patient also requests for antifungal medication due to her recurrent yeast infections after antibiotic use, will discharge with Diflucan. Return precautions discussed. Otherwise patient is stable for discharge.  Labs Review Labs Reviewed  WET PREP, GENITAL - Abnormal; Notable for the following:    Clue Cells Wet Prep HPF POC MANY (*)    WBC, Wet Prep HPF POC FEW (*)    All other components within normal limits  URINALYSIS, ROUTINE W REFLEX MICROSCOPIC - Abnormal; Notable for the following:    APPearance CLOUDY (*)    Hgb urine dipstick TRACE (*)    Leukocytes, UA SMALL (*)    All other components within normal limits  URINE MICROSCOPIC-ADD ON - Abnormal; Notable for the following:    Squamous Epithelial / LPF MANY (*)    Bacteria, UA MANY (*)    All other components within normal limits  GC/CHLAMYDIA PROBE AMP  POC URINE PREG, ED    Imaging Review No results found.   EKG Interpretation None      MDM   Final diagnoses:  UTI (lower urinary tract infection)  BV (bacterial vaginosis)  Pain, dental    BP 126/68  Pulse 94  Temp(Src) 98.2 F (36.8 C) (Oral)  Resp 17  SpO2 100%     Domenic Moras, PA-C 04/10/14 2210

## 2014-04-10 NOTE — ED Provider Notes (Signed)
Medical screening examination/treatment/procedure(s) were performed by non-physician practitioner and as supervising physician I was immediately available for consultation/collaboration.   EKG Interpretation None        Evelina Bucy, MD 04/10/14 2307

## 2014-04-10 NOTE — Discharge Instructions (Signed)
Please follow up with a dentist for further management of your dental pain.  Take antibiotic as treatment for bacterial vaginosis and urinary tract infection.  Take diflucan to prevent yeast infection.    Bacterial Vaginosis Bacterial vaginosis is a vaginal infection that occurs when the normal balance of bacteria in the vagina is disrupted. It results from an overgrowth of certain bacteria. This is the most common vaginal infection in women of childbearing age. Treatment is important to prevent complications, especially in pregnant women, as it can cause a premature delivery. CAUSES  Bacterial vaginosis is caused by an increase in harmful bacteria that are normally present in smaller amounts in the vagina. Several different kinds of bacteria can cause bacterial vaginosis. However, the reason that the condition develops is not fully understood. RISK FACTORS Certain activities or behaviors can put you at an increased risk of developing bacterial vaginosis, including:  Having a new sex partner or multiple sex partners.  Douching.  Using an intrauterine device (IUD) for contraception. Women do not get bacterial vaginosis from toilet seats, bedding, swimming pools, or contact with objects around them. SIGNS AND SYMPTOMS  Some women with bacterial vaginosis have no signs or symptoms. Common symptoms include:  Grey vaginal discharge.  A fishlike odor with discharge, especially after sexual intercourse.  Itching or burning of the vagina and vulva.  Burning or pain with urination. DIAGNOSIS  Your health care provider will take a medical history and examine the vagina for signs of bacterial vaginosis. A sample of vaginal fluid may be taken. Your health care provider will look at this sample under a microscope to check for bacteria and abnormal cells. A vaginal pH test may also be done.  TREATMENT  Bacterial vaginosis may be treated with antibiotic medicines. These may be given in the form of a  pill or a vaginal cream. A second round of antibiotics may be prescribed if the condition comes back after treatment.  HOME CARE INSTRUCTIONS   Only take over-the-counter or prescription medicines as directed by your health care provider.  If antibiotic medicine was prescribed, take it as directed. Make sure you finish it even if you start to feel better.  Do not have sex until treatment is completed.  Tell all sexual partners that you have a vaginal infection. They should see their health care provider and be treated if they have problems, such as a mild rash or itching.  Practice safe sex by using condoms and only having one sex partner. SEEK MEDICAL CARE IF:   Your symptoms are not improving after 3 days of treatment.  You have increased discharge or pain.  You have a fever. MAKE SURE YOU:   Understand these instructions.  Will watch your condition.  Will get help right away if you are not doing well or get worse. FOR MORE INFORMATION  Centers for Disease Control and Prevention, Division of STD Prevention: [employer]raud[employer] American Sexual Health Association (ASHA): www.ashastd.org  Document Released: 05/30/2005 Document Revised: 03/20/2013 Document Reviewed: 01/09/2013 [facility] Patient Information 2015 [place], [place]. This information is not intended to replace advice given to you by your health care provider. Make sure you discuss any questions you have with your health care provider.  Urinary Tract Infection A urinary tract infection (UTI) can occur any place along the urinary tract. The tract includes the kidneys, ureters, bladder, and urethra. A type of germ called bacteria often causes a UTI. UTIs are often helped with antibiotic medicine.  HOME CARE   If given,  take antibiotics as told by your doctor. Finish them even if you start to feel better.  Drink enough fluids to keep your pee (urine) clear or pale yellow.  Avoid tea, drinks with caffeine, and bubbly  (carbonated) drinks.  Pee often. Avoid holding your pee in for a long time.  Pee before and after having sex (intercourse).  Wipe from front to back after you poop (bowel movement) if you are a woman. Use each tissue only once. GET HELP RIGHT AWAY IF:   You have back pain.  You have lower belly (abdominal) pain.  You have chills.  You feel sick to your stomach (nauseous).  You throw up (vomit).  Your burning or discomfort with peeing does not go away.  You have a fever.  Your symptoms are not better in 3 days. MAKE SURE YOU:   Understand these instructions.  Will watch your condition.  Will get help right away if you are not doing well or get worse. Document Released: 11/16/2007 Document Revised: 02/22/2012 Document Reviewed: 12/29/2011 [facility] Patient Information 2015 [place], [place]. This information is not intended to replace advice given to you by your health care provider. Make sure you discuss any questions you have with your health care provider.

## 2014-04-10 NOTE — ED Notes (Addendum)
Pt c/o dysuria and vaginal discharge with slight odor.  Pt also c/o dental pain on left lower back area from either broken tooth or wisdom tooth.  Pt states that she has vaginal irritation.  Pt also states that she gets yeast infection when she takes antibiotics.

## 2014-04-11 LAB — GC/CHLAMYDIA PROBE AMP
CT Probe RNA: NEGATIVE
GC PROBE AMP APTIMA: NEGATIVE

## 2014-09-03 ENCOUNTER — Inpatient Hospital Stay (HOSPITAL_COMMUNITY)
Admission: AD | Admit: 2014-09-03 | Discharge: 2014-09-03 | Disposition: A | Discharge status: Home / Self Care | Payer: Self-pay | Source: Ambulatory Visit | Attending: Obstetrics & Gynecology | Admitting: Obstetrics & Gynecology

## 2014-09-03 ENCOUNTER — Encounter (HOSPITAL_COMMUNITY): Payer: Self-pay | Admitting: *Deleted

## 2014-09-03 DIAGNOSIS — N898 Other specified noninflammatory disorders of vagina: Secondary | ICD-10-CM | POA: Insufficient documentation

## 2014-09-03 LAB — URINALYSIS, ROUTINE W REFLEX MICROSCOPIC
BILIRUBIN URINE: NEGATIVE
GLUCOSE, UA: NEGATIVE mg/dL
HGB URINE DIPSTICK: NEGATIVE
KETONES UR: NEGATIVE mg/dL
Leukocytes, UA: NEGATIVE
Nitrite: NEGATIVE
Protein, ur: NEGATIVE mg/dL
Specific Gravity, Urine: 1.02 (ref 1.005–1.030)
Urobilinogen, UA: 0.2 mg/dL (ref 0.0–1.0)
pH: 7 (ref 5.0–8.0)

## 2014-09-03 LAB — POCT PREGNANCY, URINE: PREG TEST UR: NEGATIVE

## 2014-09-03 LAB — WET PREP, GENITAL
Clue Cells Wet Prep HPF POC: NONE SEEN
Trich, Wet Prep: NONE SEEN
WBC, Wet Prep HPF POC: NONE SEEN
Yeast Wet Prep HPF POC: NONE SEEN

## 2014-09-03 NOTE — MAU Note (Signed)
Smelly, brown discharge- started Sunday.   Seems to get frequent yeast infections.

## 2014-09-03 NOTE — MAU Provider Note (Signed)
History     CSN: [id number]  Arrival date and time: 09/03/14 1523   First Provider Initiated Contact with Patient 09/03/14 1834      Chief Complaint  Patient presents with  . Vaginal Discharge   HPI    [name] is a 34 y.o. female [place] who presents with vaginal discharge; this is a chronic problem. She has noticed abnormal vaginal discharge for about 8 years; it is an ongoing problem. The discharge is described as brown discharge with an odor.  She feels like she can smell the discharge through her pants. She has not tried anything over the counter. She always uses a condoms with sex.  She denies pain. She does not use any scented soaps.   She does have a new sexual partner.   OB History    Gravida Para Term Preterm AB TAB SAB Ectopic Multiple Living   0 0 0 0 0 0 0 0 0 0       Past Medical History  Diagnosis Date  . Anemia   . Fibroids     Past Surgical History  Procedure Laterality Date  . No past surgeries    . Tibia im nail insertion Left 08/07/2013    Procedure: INTRAMEDULLARY (IM) NAIL TIBIAL;  Surgeon: Mauri Pole, MD;  Location: [facility];  Service: Orthopedics;  Laterality: Left;    Family History  Problem Relation Age of Onset  . Hypertension Other     History  Substance Use Topics  . Smoking status: Never Smoker   . Smokeless tobacco: Never Used  . Alcohol Use: No    Allergies: No Known Allergies  Prescriptions prior to admission  Medication Sig Dispense Refill Last Dose  . acetaminophen (TYLENOL) 500 MG tablet Take 1,000 mg by mouth every 6 (six) hours as needed (tooth pain).   09/03/2014 at Unknown time  . ibuprofen (ADVIL,MOTRIN) 200 MG tablet Take 400 mg by mouth 2 (two) times daily as needed (tooth pain).    Past Month at Unknown time  . aspirin 325 MG EC tablet Take 325 mg by mouth every 6 (six) hours as needed for pain (pain).   more than one month  . cephALEXin (KEFLEX) 500 MG capsule Take 2 capsules (1,000 mg total) by mouth 2  (two) times daily. (Patient not taking: Reported on 09/03/2014) 40 capsule 0   . fluconazole (DIFLUCAN) 150 MG tablet Take 1 tablet (150 mg total) by mouth once. May repeat one additional dose in 4 days if no improvement (Patient not taking: Reported on 09/03/2014) 2 tablet 0   . Ibuprofen-Diphenhydramine Cit (MOTRIN PM) 200-38 MG TABS Take 1 tablet by mouth daily as needed (tooth pain).   more than one month  . metroNIDAZOLE (FLAGYL) 500 MG tablet Take 1 tablet (500 mg total) by mouth 2 (two) times daily. 14 tablet 0     Results for orders placed or performed during the hospital encounter of 09/03/14 (from the past 48 hour(s))  Urinalysis, Routine w reflex microscopic     Status: Abnormal   Collection Time: 09/03/14  4:05 PM  Result Value Ref Range   Color, Urine YELLOW YELLOW   APPearance HAZY (A) CLEAR   Specific Gravity, Urine 1.020 1.005 - 1.030   pH 7.0 5.0 - 8.0   Glucose, UA NEGATIVE NEGATIVE mg/dL   Hgb urine dipstick NEGATIVE NEGATIVE   Bilirubin Urine NEGATIVE NEGATIVE   Ketones, ur NEGATIVE NEGATIVE mg/dL   Protein, ur NEGATIVE NEGATIVE mg/dL  Urobilinogen, UA 0.2 0.0 - 1.0 mg/dL   Nitrite NEGATIVE NEGATIVE   Leukocytes, UA NEGATIVE NEGATIVE    Comment: MICROSCOPIC NOT DONE ON URINES WITH NEGATIVE PROTEIN, BLOOD, LEUKOCYTES, NITRITE, OR GLUCOSE <1000 mg/dL.  Pregnancy, urine POC     Status: None   Collection Time: 09/03/14  4:12 PM  Result Value Ref Range   Preg Test, Ur NEGATIVE NEGATIVE    Comment:        THE SENSITIVITY OF THIS METHODOLOGY IS >24 mIU/mL   Wet prep, genital     Status: None   Collection Time: 09/03/14  5:22 PM  Result Value Ref Range   Yeast Wet Prep HPF POC NONE SEEN NONE SEEN   Trich, Wet Prep NONE SEEN NONE SEEN   Clue Cells Wet Prep HPF POC NONE SEEN NONE SEEN   WBC, Wet Prep HPF POC NONE SEEN NONE SEEN    Comment: FEW BACTERIA SEEN   Review of Systems  Constitutional: Negative for fever and chills.  Gastrointestinal: Negative for vomiting.   Genitourinary: Negative for dysuria, urgency, frequency and hematuria.   Physical Exam   Blood pressure 130/77, pulse 82, temperature 98.5 F (36.9 C), temperature source Oral, resp. rate 16, weight 86.637 kg (191 lb), last menstrual period 08/20/2014.  Physical Exam  Constitutional: She is oriented to person, place, and time. She appears well-developed and well-nourished. No distress.  HENT:  Head: Normocephalic.  Eyes: Pupils are equal, round, and reactive to light.  Neck: Neck supple.  Respiratory: Effort normal.  Genitourinary:  Wet prep and GC collected by RN   Musculoskeletal: Normal range of motion.  Neurological: She is alert and oriented to person, place, and time.  Skin: Skin is warm. She is not diaphoretic.  Psychiatric: Her behavior is normal.    MAU Course  Procedures  None  MDM HIV Wet prep GC   Assessment and Plan   A:  1. Foul smelling vaginal discharge    P;  Discharge home in stable condition Condoms always Follow [facility] with [facility]  Probiotics over the counter as directed on the bottle   [name], NP 09/03/2014 6:45 PM

## 2014-09-04 ENCOUNTER — Inpatient Hospital Stay (HOSPITAL_COMMUNITY)
Admission: AD | Admit: 2014-09-04 | Discharge: 2014-09-04 | Disposition: A | Discharge status: Home / Self Care | Payer: Self-pay | Source: Ambulatory Visit | Attending: Family Medicine | Admitting: Family Medicine

## 2014-09-04 DIAGNOSIS — A549 Gonococcal infection, unspecified: Secondary | ICD-10-CM | POA: Insufficient documentation

## 2014-09-04 LAB — HIV ANTIBODY (ROUTINE TESTING W REFLEX): HIV Screen 4th Generation wRfx: NONREACTIVE

## 2014-09-04 LAB — GC/CHLAMYDIA PROBE AMP (~~LOC~~) NOT AT ARMC
Chlamydia: NEGATIVE
NEISSERIA GONORRHEA: POSITIVE — AB

## 2014-09-04 MED ORDER — FLUCONAZOLE 150 MG PO TABS
150.0000 mg | ORAL_TABLET | Freq: Every day | ORAL | Status: DC
Start: 2014-09-04 — End: 2015-07-02

## 2014-09-04 MED ORDER — AZITHROMYCIN 250 MG PO TABS
1000.0000 mg | ORAL_TABLET | Freq: Once | ORAL | Status: AC
  Administered 2014-09-04: 1000 mg via ORAL
  Filled 2014-09-04: qty 4

## 2014-09-04 MED ORDER — CEFTRIAXONE SODIUM 250 MG IJ SOLR
250.0000 mg | Freq: Once | INTRAMUSCULAR | Status: AC
  Administered 2014-09-04: 250 mg via INTRAMUSCULAR
  Filled 2014-09-04: qty 250

## 2014-09-04 NOTE — MAU Provider Note (Signed)
Subjective:  [name] is a 34 y.o. female [place] who presents for treatment of Gonorrhea. She was seen yesterday in MAU and her culture came back positive today.     Objective:  GENERAL: Well-developed, well-nourished female in no acute distress.  LUNGS: Effort normal SKIN: Warm, dry and without erythema PSYCH: Normal mood and affect    Assessment:  1. Gonorrhea in female     Plan:  Discharge home in stable condition Patient treated with Azithromycin and rocephin in MAU Partner needs treatment No sex for 7 days once partner is treated   Patient requests diflucan; says she has trouble with yeast following antibiotic use.  RX diflucan   Kaitlyn Lye, NP 09/04/2014 5:48 PM

## 2014-09-22 ENCOUNTER — Encounter (HOSPITAL_COMMUNITY): Payer: Self-pay | Admitting: Emergency Medicine

## 2014-09-22 ENCOUNTER — Emergency Department (HOSPITAL_COMMUNITY)
Admission: EM | Admit: 2014-09-22 | Discharge: 2014-09-22 | Disposition: A | Discharge status: Home / Self Care | Payer: Self-pay | Attending: Emergency Medicine | Admitting: Emergency Medicine

## 2014-09-22 DIAGNOSIS — K0889 Other specified disorders of teeth and supporting structures: Secondary | ICD-10-CM

## 2014-09-22 DIAGNOSIS — Y9289 Other specified places as the place of occurrence of the external cause: Secondary | ICD-10-CM | POA: Insufficient documentation

## 2014-09-22 DIAGNOSIS — Y998 Other external cause status: Secondary | ICD-10-CM | POA: Insufficient documentation

## 2014-09-22 DIAGNOSIS — Z86018 Personal history of other benign neoplasm: Secondary | ICD-10-CM | POA: Insufficient documentation

## 2014-09-22 DIAGNOSIS — X58XXXA Exposure to other specified factors, initial encounter: Secondary | ICD-10-CM | POA: Insufficient documentation

## 2014-09-22 DIAGNOSIS — Z862 Personal history of diseases of the blood and blood-forming organs and certain disorders involving the immune mechanism: Secondary | ICD-10-CM | POA: Insufficient documentation

## 2014-09-22 DIAGNOSIS — Y9389 Activity, other specified: Secondary | ICD-10-CM | POA: Insufficient documentation

## 2014-09-22 DIAGNOSIS — Z8619 Personal history of other infectious and parasitic diseases: Secondary | ICD-10-CM | POA: Insufficient documentation

## 2014-09-22 DIAGNOSIS — Z79899 Other long term (current) drug therapy: Secondary | ICD-10-CM | POA: Insufficient documentation

## 2014-09-22 DIAGNOSIS — S025XXA Fracture of tooth (traumatic), initial encounter for closed fracture: Secondary | ICD-10-CM | POA: Insufficient documentation

## 2014-09-22 HISTORY — DX: Gonococcal infection, unspecified: A54.9

## 2014-09-22 MED ORDER — TRAMADOL HCL 50 MG PO TABS
50.0000 mg | ORAL_TABLET | Freq: Four times a day (QID) | ORAL | Status: DC | PRN
Start: 2014-09-22 — End: 2015-12-02

## 2014-09-22 MED ORDER — PENICILLIN V POTASSIUM 500 MG PO TABS: 500.0000 mg | ORAL_TABLET | Freq: Three times a day (TID) | ORAL | Status: DC

## 2014-09-22 MED ORDER — TRAMADOL HCL 50 MG PO TABS
50.0000 mg | ORAL_TABLET | Freq: Once | ORAL | Status: AC
Start: 2014-09-22 — End: 2014-09-22
  Administered 2014-09-22: 50 mg via ORAL
  Filled 2014-09-22: qty 1

## 2014-09-22 MED ORDER — PENICILLIN V POTASSIUM 500 MG PO TABS
500.0000 mg | ORAL_TABLET | Freq: Once | ORAL | Status: AC
  Administered 2014-09-22: 500 mg via ORAL
  Filled 2014-09-22: qty 1

## 2014-09-22 NOTE — Discharge Instructions (Signed)
Dental Pain °A tooth ache may be caused by cavities (tooth decay). Cavities expose the nerve of the tooth to air and hot or cold temperatures. It may come from an infection or abscess (also called a boil or furuncle) around your tooth. It is also often caused by dental caries (tooth decay). This causes the pain you are having. °DIAGNOSIS  °Your caregiver can diagnose this problem by exam. °TREATMENT  °· If caused by an infection, it may be treated with medications which kill germs (antibiotics) and pain medications as prescribed by your caregiver. Take medications as directed. °· Only take over-the-counter or prescription medicines for pain, discomfort, or fever as directed by your caregiver. °· Whether the tooth ache today is caused by infection or dental disease, you should see your dentist as soon as possible for further care. °SEEK MEDICAL CARE IF: °The exam and treatment you received today has been provided on an emergency basis only. This is not a substitute for complete medical or dental care. If your problem worsens or new problems (symptoms) appear, and you are unable to meet with your dentist, call or return to this location. °SEEK IMMEDIATE MEDICAL CARE IF:  °· You have a fever. °· You develop redness and swelling of your face, jaw, or neck. °· You are unable to open your mouth. °· You have severe pain uncontrolled by pain medicine. °MAKE SURE YOU:  °· Understand these instructions. °· Will watch your condition. °· Will get help right away if you are not doing well or get worse. °Document Released: 05/30/2005 Document Revised: 08/22/2011 Document Reviewed: 01/16/2008 °ExitCare® Patient Information ©2015 [employer]. This information is not intended to replace advice given to you by your health care provider. Make sure you discuss any questions you have with your health care provider. ° °Emergency Department Resource Guide °1) Find a Doctor and Pay Out of Pocket °Although you won't have to find out who  is covered by your insurance plan, it is a good idea to ask around and get recommendations. You will then need to call the office and see if the doctor you have chosen will accept you as a new patient and what types of options they offer for patients who are self-pay. Some doctors offer discounts or will set up payment plans for their patients who do not have insurance, but you will need to ask so you aren't surprised when you get to your appointment. ° °2) Contact Your Local Health Department °Not all health departments have doctors that can see patients for sick visits, but many do, so it is worth a call to see if yours does. If you don't know where your local health department is, you can check in your phone book. The CDC also has a tool to help you locate your state's health department, and many state websites also have listings of all of their local health departments. ° °3) Find a Walk-in Clinic °If your illness is not likely to be very severe or complicated, you may want to try a walk in clinic. These are popping up all over the country in pharmacies, drugstores, and shopping centers. They're usually staffed by nurse practitioners or physician assistants that have been trained to treat common illnesses and complaints. They're usually fairly quick and inexpensive. However, if you have serious medical issues or chronic medical problems, these are probably not your best option. ° °No Primary Care Doctor: °- Call [facility] at  832-8000 - they can help you locate a primary   care doctor that  accepts your insurance, provides certain services, etc. °- Physician Referral Service- 1-800-533-3463 ° °Chronic Pain Problems: °Organization         Address  Phone   Notes  °[facility]  ([phone]) [phone] Patients need to be referred by their primary care doctor.  ° °Medication Assistance: °Organization         Address  Phone   Notes  °[place] [facility] [address]  [address] °[place], [place] [address] (336) 641-8030 --Must be a resident of [place] °-- Must have NO insurance coverage whatsoever (no Medicaid/ Medicare, etc.) °-- The pt. MUST have a primary care doctor that directs their care regularly and follows them in the community °  °[facility]  (866) 331-1348   °[employer]  (888) 892-1162   ° °Agencies that provide inexpensive medical care: °Organization         Address  Phone   Notes  °[facility]  ([phone]) [phone]   °[facility]    (336) 832-7272   °[facility] [address] °[place], [place] [address] (336) 832-4777   °[facility] [address], °[place] (336) 271-4999   °[facility]    (336) 373-0678   °[facility]    (336) 272-1050   °[facility] ° [address], [place] Phone:  (336) 832-4444, Fax:  (336) 832-4440 Hours of Operation:  9 am - 6 pm, M-F.  Also accepts Medicaid/Medicare and self-pay.  °[facility] ° [address], [place] Phone: (336) [phone], Fax: (336) [phone]. Hours of Operation:  8:30 am - 5:30 pm, M-F.  Also accepts Medicaid and self-pay.  °[facility] [place] [address], [place] Phone: (336) [phone]   °[facility] [address], [place], [place] (336)723-1848, Ext. 123 Mondays & Thursdays: 7-9 AM.  First 15 patients are seen on a first come, first serve basis. °  ° °Medicaid-accepting [place] Providers: ° °Organization         Address  Phone   Notes  °[facility] [address], [place] ([phone]) [phone] Also accepts self-pay patients.  °[facility] [address], [place] ° (336) [phone]   °[facility] [address], [place] (336) [phone]   °[facility] [address] [place] [address], [place] (336) [phone]   °[facility] [address], [place]  ° (336) [phone] Only accepts [place] Access Medicaid patients after they have their name applied to their card.  ° °Self-Pay (no insurance) in [place]: ° °Organization         Address  Phone   Notes  °Sickle Cell Patients, [facility] [address], [place] (336) [phone]   °[facility] [address], [place] (336) [phone]   °[facility] [place] ° [address], [place] (336) [phone]   °[facility]/Dr. Osei-Bonsu ° [address] [place] [address], [place] or [address], [place] ([phone]) [phone] Phone number for both [place] and [place] locations is the same.  °[facility] [address], [place] (336) [phone]   °[facility] [address] [place] [address], [place] or [address] (336) 852-7530 °(336) 878-2260   °  [facility] [address], [place] (336) 350-1642, phone; (336) [phone], fax Sees patients 1st and 3rd Saturday of every month.  Must not qualify for public or private insurance (i.e. Medicaid, Medicare, [place] Health Choice, Veterans' Benefits) • Household income should be no more than 200% of the poverty level •The clinic cannot treat you if you are pregnant or think you are pregnant • Sexually transmitted diseases are not treated at the clinic.  ° ° °Dental Care: °Organization         Address  Phone  Notes  °[facility] [address], [place] (336) 641-6152 Accepts children up to age 21 who are enrolled in Medicaid or [place] Health Choice; pregnant women with a Medicaid card; and children who have applied for Medicaid or [place] Health Choice, but were declined, whose parents can pay a reduced fee at time of service.  °[facility] [place]  [address], [place] (336) 641-7733 Accepts children up to age 21 who are  enrolled in Medicaid or [place] Health Choice; pregnant women with a Medicaid card; and children who have applied for Medicaid or [place] Health Choice, but were declined, whose parents can pay a reduced fee at time of service.  °[facility] ° [address], [place] (336) [phone] Patients are seen by appointment only. Walk-ins are not accepted. [facility] will see patients 18 years of age and older. °Monday - Tuesday (8am-5pm) °Most Wednesdays (8:30-5pm) °$30 per visit, cash only  °[facility] ° [address], [place] (336) [phone] Patients are seen by appointment only. Walk-ins are not accepted. [facility] will see patients 18 years of age and older. °One Wednesday Evening (Monthly: Volunteer Based).  $30 per visit, cash only  °[facility]  ([phone]) [phone] for adults; Children under age 4, call Graduate Pediatric Dentistry at ([phone]) [phone]. Children aged 4-14, please call ([phone]) [phone] to request a pediatric application. ° Dental services are provided in all areas of dental care including fillings, crowns and bridges, complete and partial dentures, implants, gum treatment, root canals, and extractions. Preventive care is also provided. Treatment is provided to both adults and children. °Patients are selected via a lottery and there is often a waiting list. °  °[facility] [address], °[place] ° (336) 763-8833 [email or web address] °  °[facility] [address], [place], [place] (336)723-1848, Ext. 123 Second and Fourth Thursday of each month, opens at 6:30 AM; Clinic ends at 9 AM.  Patients are seen on a first-come first-served basis, and a limited number are seen during each clinic.  ° °[facility] ° [address], [place], [place] ([phone]) [phone]   Eligibility Requirements °You must have lived in [place], [place], or [place] counties for at least the last three months. °  You  cannot be eligible for state or federal sponsored healthcare insurance, including Veterans Administration, Medicaid, or Medicare. °  You generally cannot be eligible for healthcare insurance through your employer.  °  How to apply: °Eligibility screenings are held every Tuesday and Wednesday afternoon from 1:00 pm until 4:00 pm. You do not need an appointment for the interview!  °[facility] [address], [place], [place] [phone]   °[facility]  [phone]   °[facility]  [phone]   °[facility]  [phone]   °  °

## 2014-09-22 NOTE — ED Provider Notes (Signed)
CSN: [id number]     Arrival date & time 09/22/14  0144 History   First MD Initiated Contact with Patient 09/22/14 [phone]     Chief Complaint  Patient presents with  . Dental Pain     (Consider location/radiation/quality/duration/timing/severity/associated sxs/prior Treatment) Patient is a 34 y.o. female presenting with tooth pain. The history is provided by the patient. No language interpreter was used.  Dental Pain Location:  Lower Lower teeth location:  18/LL 2nd molar Associated symptoms: no facial swelling, no fever and no headaches   Associated symptoms comment:  Painful tooth that broke in the past, painful since earlier tonight. No facial swelling or fever.    Past Medical History  Diagnosis Date  . Anemia   . Fibroids   . Gonorrhea    Past Surgical History  Procedure Laterality Date  . No past surgeries    . Tibia im nail insertion Left 08/07/2013    Procedure: INTRAMEDULLARY (IM) NAIL TIBIAL;  Surgeon: Mauri Pole, MD;  Location: [facility];  Service: Orthopedics;  Laterality: Left;   Family History  Problem Relation Age of Onset  . Hypertension Other    History  Substance Use Topics  . Smoking status: Never Smoker   . Smokeless tobacco: Never Used  . Alcohol Use: No   OB History    Gravida Para Term Preterm AB TAB SAB Ectopic Multiple Living   0 0 0 0 0 0 0 0 0 0      Review of Systems  Constitutional: Negative for fever and chills.  HENT: Positive for dental problem. Negative for facial swelling and trouble swallowing.   Gastrointestinal: Negative.  Negative for nausea.  Neurological: Negative.  Negative for headaches.      Allergies  Review of patient's allergies indicates no known allergies.  Home Medications   Prior to Admission medications   Medication Sig Start Date End Date Taking? Authorizing Provider  acetaminophen (TYLENOL) 500 MG tablet Take 1,000 mg by mouth every 6 (six) hours as needed (tooth pain).    Historical Provider, MD   fluconazole (DIFLUCAN) 150 MG tablet Take 1 tablet (150 mg total) by mouth daily. 09/04/14   Artist Pais Rasch, NP  Ibuprofen-Diphenhydramine Cit (MOTRIN PM) 200-38 MG TABS Take 1 tablet by mouth daily as needed (tooth pain).    Historical Provider, MD   BP 138/86 mmHg  Pulse 105  Temp(Src) 99 F (37.2 C) (Oral)  Resp 20  SpO2 100%  LMP 08/20/2014 Physical Exam  Constitutional: She is oriented to person, place, and time. She appears well-developed and well-nourished.  HENT:  Generally good dentition. #17 fractured without gross surrounding swelling.   Neck: Normal range of motion.  Pulmonary/Chest: Effort normal.  Neurological: She is alert and oriented to person, place, and time.  Skin: Skin is warm and dry.    ED Course  Procedures (including critical care time) Labs Review Labs Reviewed - No data to display  Imaging Review No results found.   EKG Interpretation None      MDM   Final diagnoses:  None    1. Dental pain  Uncomplicated dental pain with fracture.     Charlann Lange, PA-C 09/22/14 [id number]  Julianne Rice, MD 09/22/14 [phone]

## 2014-09-22 NOTE — ED Notes (Signed)
Pt c/o L lower dental pain, pt states she believes this to be her wisdom teeth hurting again. Pt states she has had the recommendation to have them removed. Pt states she has taken Tylenol x 2 and BC powder.

## 2015-01-12 ENCOUNTER — Encounter (HOSPITAL_COMMUNITY): Payer: Self-pay | Admitting: *Deleted

## 2015-01-12 ENCOUNTER — Inpatient Hospital Stay (HOSPITAL_COMMUNITY)
Admission: AD | Admit: 2015-01-12 | Discharge: 2015-01-12 | Disposition: A | Discharge status: Home / Self Care | Payer: Self-pay | Source: Ambulatory Visit | Attending: Family Medicine | Admitting: Family Medicine

## 2015-01-12 DIAGNOSIS — N938 Other specified abnormal uterine and vaginal bleeding: Secondary | ICD-10-CM

## 2015-01-12 DIAGNOSIS — D500 Iron deficiency anemia secondary to blood loss (chronic): Secondary | ICD-10-CM

## 2015-01-12 DIAGNOSIS — D649 Anemia, unspecified: Secondary | ICD-10-CM | POA: Insufficient documentation

## 2015-01-12 LAB — URINALYSIS, ROUTINE W REFLEX MICROSCOPIC
BILIRUBIN URINE: NEGATIVE
GLUCOSE, UA: NEGATIVE mg/dL
KETONES UR: NEGATIVE mg/dL
LEUKOCYTES UA: NEGATIVE
NITRITE: NEGATIVE
PH: 7 (ref 5.0–8.0)
Protein, ur: NEGATIVE mg/dL
Specific Gravity, Urine: 1.02 (ref 1.005–1.030)
UROBILINOGEN UA: 0.2 mg/dL (ref 0.0–1.0)

## 2015-01-12 LAB — CBC WITH DIFFERENTIAL/PLATELET
BASOS ABS: 0 10*3/uL (ref 0.0–0.1)
Basophils Relative: 0 % (ref 0–1)
EOS PCT: 3 % (ref 0–5)
Eosinophils Absolute: 0.2 10*3/uL (ref 0.0–0.7)
HCT: 23.7 % — ABNORMAL LOW (ref 36.0–46.0)
HEMOGLOBIN: 6.7 g/dL — AB (ref 12.0–15.0)
LYMPHS ABS: 1.6 10*3/uL (ref 0.7–4.0)
LYMPHS PCT: 26 % (ref 12–46)
MCH: 16.2 pg — AB (ref 26.0–34.0)
MCHC: 28.3 g/dL — ABNORMAL LOW (ref 30.0–36.0)
MCV: 57.2 fL — ABNORMAL LOW (ref 78.0–100.0)
Monocytes Absolute: 1.2 10*3/uL — ABNORMAL HIGH (ref 0.1–1.0)
Monocytes Relative: 20 % — ABNORMAL HIGH (ref 3–12)
NEUTROS ABS: 3.2 10*3/uL (ref 1.7–7.7)
NEUTROS PCT: 51 % (ref 43–77)
OTHER: 0 %
Platelets: 377 10*3/uL (ref 150–400)
RBC: 4.14 MIL/uL (ref 3.87–5.11)
RDW: 22.3 % — ABNORMAL HIGH (ref 11.5–15.5)
WBC: 6.2 10*3/uL (ref 4.0–10.5)

## 2015-01-12 LAB — URINE MICROSCOPIC-ADD ON

## 2015-01-12 LAB — POCT PREGNANCY, URINE: Preg Test, Ur: NEGATIVE

## 2015-01-12 MED ORDER — FERROUS SULFATE 325 (65 FE) MG PO TABS: 325.0000 mg | ORAL_TABLET | Freq: Two times a day (BID) | ORAL | Status: DC

## 2015-01-12 MED ORDER — NORGESTIMATE-ETH ESTRADIOL 0.25-35 MG-MCG PO TABS: 1.0000 | ORAL_TABLET | Freq: Every day | ORAL | Status: DC

## 2015-01-12 NOTE — MAU Provider Note (Signed)
History   pt in with c/o heavy bleeding x 16 days. Pt states she has hx of fibroids but has not been seen in several years. States her periods are usually regular but last apx 8-9 days mod flow.  CSN: [id number]  Arrival date and time: 01/12/15 1541   First Provider Initiated Contact with Patient 01/12/15 1611      Chief Complaint  Patient presents with  . Vaginal Bleeding   HPI  OB History    Gravida Para Term Preterm AB TAB SAB Ectopic Multiple Living   0 0 0 0 0 0 0 0 0 0       Past Medical History  Diagnosis Date  . Anemia   . Fibroids   . Gonorrhea     Past Surgical History  Procedure Laterality Date  . No past surgeries    . Tibia im nail insertion Left 08/07/2013    Procedure: INTRAMEDULLARY (IM) NAIL TIBIAL;  Surgeon: Mauri Pole, MD;  Location: [facility];  Service: Orthopedics;  Laterality: Left;    Family History  Problem Relation Age of Onset  . Hypertension Other     History  Substance Use Topics  . Smoking status: Never Smoker   . Smokeless tobacco: Never Used  . Alcohol Use: No    Allergies: No Known Allergies  Prescriptions prior to admission  Medication Sig Dispense Refill Last Dose  . ibuprofen (ADVIL,MOTRIN) 200 MG tablet Take 400 mg by mouth every 8 (eight) hours as needed for mild pain.   Past Week at Unknown time  . Probiotic Product (PROBIOTIC PO) Take 1 capsule by mouth daily.   01/11/2015 at Unknown time  . fluconazole (DIFLUCAN) 150 MG tablet Take 1 tablet (150 mg total) by mouth daily. (Patient not taking: Reported on 01/12/2015) 1 tablet 0 Completed Course at Unknown time  . penicillin v potassium (VEETID) 500 MG tablet Take 1 tablet (500 mg total) by mouth 3 (three) times daily. (Patient not taking: Reported on 01/12/2015) 30 tablet 0 Completed Course at Unknown time  . traMADol (ULTRAM) 50 MG tablet Take 1 tablet (50 mg total) by mouth every 6 (six) hours as needed. (Patient not taking: Reported on 01/12/2015) 15 tablet 0 Not Taking at  Unknown time    Review of Systems  Constitutional: Positive for malaise/fatigue.  HENT: Negative.   Respiratory: Negative.   Cardiovascular: Negative.   Gastrointestinal: Positive for abdominal pain.  Genitourinary: Negative.   Musculoskeletal: Negative.   Skin: Negative.   Neurological: Negative.   Endo/Heme/Allergies: Negative.   Psychiatric/Behavioral: Negative.    Physical Exam   Blood pressure 122/74, pulse 100, temperature 98.5 F (36.9 C), resp. rate 16, height 5\' 11"  (1.803 m), weight 186 lb 8 oz (84.596 kg), last menstrual period 12/28/2014, SpO2 100 %.  Physical Exam  Constitutional: She is oriented to person, place, and time. She appears well-developed and well-nourished.  HENT:  Head: Normocephalic.  Eyes: Pupils are equal, round, and reactive to light.  Neck: Normal range of motion.  Cardiovascular: Normal rate, regular rhythm, normal heart sounds and intact distal pulses.   Respiratory: Effort normal and breath sounds normal.  GI: Soft. Bowel sounds are normal.  Genitourinary:  smamt vag bleeding with sm clots in vault.  Musculoskeletal: Normal range of motion.  Neurological: She is alert and oriented to person, place, and time. She has normal reflexes.  Skin: Skin is warm and dry.  Psychiatric: She has a normal mood and affect. Her behavior is normal. Judgment and  thought content normal.    MAU Course  Procedures  MDM DUB Anemia  Assessment and Plan  **sterile spec exam done sm amt vag bleeding*noted with sm clots in vag vault. Bimanual exam WNL. DUB Asymptomatic anemia Will start OCP's risk and benefits discussed and will get pt in to GYN clinic for eval. POC discussed with Dr. Leota Jacobsen, MARIE DARLENE 01/12/2015, 4:23 PM

## 2015-01-12 NOTE — Discharge Instructions (Signed)
Iron Deficiency Anemia Anemia is when you have a low number of healthy red blood cells. It is often caused by too little iron. This is called iron deficiency anemia. It may make you tired and short of breath. HOME CARE   Take iron as told by your doctor.  Take vitamins as told by your doctor.  Eat foods that have iron in them. This includes liver, lean beef, whole-grain bread, eggs, dried fruit, and dark green leafy vegetables. GET HELP RIGHT AWAY IF:  You pass out (faint).  You have chest pain.  You feel sick to your stomach (nauseous) or throw up (vomit).  You get very short of breath with activity.  You are weak.  You have a fast heartbeat.  You start to sweat for no reason.  You become light-headed when getting up from a chair or bed. MAKE SURE YOU:  Understand these instructions.  Will watch your condition.  Will get help right away if you are not doing well or get worse. Document Released: 07/02/2010 Document Revised: 06/04/2013 Document Reviewed: 02/04/2013 [facility] Patient Information 2015 [place], [place]. This information is not intended to replace advice given to you by your health care provider. Make sure you discuss any questions you have with your health care provider. Ferritin This is done to test for anemia. Anemia occurs when the amount of hemoglobin (found in the red blood cells) drops below normal. Hemoglobin is necessary for the transportation of oxygen throughout the body. Blood tests may show a variety of common, treatable abnormalities that can lead to problems associated with anemia. Iron deficiency anemia is the most common of the anemias. It is usually due to bleeding. In women, iron deficiency may be due to heavy menstrual periods. In older women and in men, the bleeding is usually from disease of the intestines. In children and in pregnant women, the body needs more iron. Iron deficiency may be due to simply not eating enough iron in the diet. Iron  deficiency may also result from some extreme diets. Treatment of iron deficiency usually involves iron supplements. In older women and in men, there is usually some further testing needed to determine why the person is iron deficient.  PREPARATION FOR TEST A blood sample is obtained by inserting a needle into a vein in the arm. NORMAL FINDINGS Female: 12-300ng/ml or 12-300  g/L (SI units) Female:  10-150 ng/ml or 10-150  g/L (SI units) Children/adolescent:  Newborn: 25-200 ng/ml  1 month: 200-600 ng/ml  2-5 months: 50-200 ng/ml  6 months-15 years: 7-142 ng/ml Ranges for normal findings may vary among different laboratories and hospitals. You should always check with your doctor after having lab work or other tests done to discuss the meaning of your test results and whether your values are considered within normal limits. MEANING OF TEST  Your caregiver will go over the test results with you and discuss the importance and meaning of your results, as well as treatment options and the need for additional tests if necessary. OBTAINING THE TEST RESULTS  It is your responsibility to obtain your test results. Ask the lab or department performing the test when and how you will get your results. Document Released: 06/22/2004 Document Revised: 08/22/2011 Document Reviewed: 05/09/2008 [facility] Patient Information 2015 [place], [place]. This information is not intended to replace advice given to you by your health care provider. Make sure you discuss any questions you have with your health care provider. Dysfunctional Uterine Bleeding Normally, menstrual periods begin between ages 73  to 6 in young women. A normal menstrual cycle/period may begin every 23 days up to 35 days and lasts from 1 to 7 days. Around 12 to 14 days before your menstrual period starts, ovulation (ovary produces an egg) occurs. When counting the time between menstrual periods, count from the first day of bleeding of the previous  period to the first day of bleeding of the next period. Dysfunctional (abnormal) uterine bleeding is bleeding that is different from a normal menstrual period. Your periods may come earlier or later than usual. They may be lighter, have blood clots or be heavier. You may have bleeding between periods, or you may skip one period or more. You may have bleeding after sexual intercourse, bleeding after menopause, or no menstrual period. CAUSES   Pregnancy (normal, miscarriage, tubal).  IUDs (intrauterine device, birth control).  Birth control pills.  Hormone treatment.  Menopause.  Infection of the cervix.  Blood clotting problems.  Infection of the inside lining of the uterus.  Endometriosis, inside lining of the uterus growing in the pelvis and other female organs.  Adhesions (scar tissue) inside the uterus.  Obesity or severe weight loss.  Uterine polyps inside the uterus.  Cancer of the vagina, cervix, or uterus.  Ovarian cysts or polycystic ovary syndrome.  Medical problems (diabetes, thyroid disease).  Uterine fibroids (noncancerous tumor).  Problems with your female hormones.  Endometrial hyperplasia, very thick lining and enlarged cells inside of the uterus.  Medicines that interfere with ovulation.  Radiation to the pelvis or abdomen.  Chemotherapy. DIAGNOSIS   Your doctor will discuss the history of your menstrual periods, medicines you are taking, changes in your weight, stress in your life, and any medical problems you may have.  Your doctor will do a physical and pelvic examination.  Your doctor may want to perform certain tests to make a diagnosis, such as:  Pap test.  Blood tests.  Cultures for infection.  CT scan.  Ultrasound.  Hysteroscopy.  Laparoscopy.  MRI.  Hysterosalpingography.  D and C.  Endometrial biopsy. TREATMENT  Treatment will depend on the cause of the dysfunctional uterine bleeding (DUB). Treatment may  include:  Observing your menstrual periods for a couple of months.  Prescribing medicines for medical problems, including:  Antibiotics.  Hormones.  Birth control pills.  Removing an IUD (intrauterine device, birth control).  Surgery:  D and C (scrape and remove tissue from inside the uterus).  Laparoscopy (examine inside the abdomen with a lighted tube).  Uterine ablation (destroy lining of the uterus with electrical current, laser, heat, or freezing).  Hysteroscopy (examine cervix and uterus with a lighted tube).  Hysterectomy (remove the uterus). HOME CARE INSTRUCTIONS   If medicines were prescribed, take exactly as directed. Do not change or switch medicines without consulting your caregiver.  Long term heavy bleeding may result in iron deficiency. Your caregiver may have prescribed iron pills. They help replace the iron that your body lost from heavy bleeding. Take exactly as directed.  Do not take aspirin or medicines that contain aspirin one week before or during your menstrual period. Aspirin may make the bleeding worse.  If you need to change your sanitary pad or tampon more than once every 2 hours, stay in bed with your feet elevated and a cold pack on your lower abdomen. Rest as much as possible, until the bleeding stops or slows down.  Eat well-balanced meals. Eat foods high in iron. Examples are:  Leafy green vegetables.  Whole-grain breads and  cereals.  Eggs.  Meat.  Liver.  Do not try to lose weight until the abnormal bleeding has stopped and your blood iron level is back to normal. Do not lift more than ten pounds or do strenuous activities when you are bleeding.  For a couple of months, make note on your calendar, marking the start and ending of your period, and the type of bleeding (light, medium, heavy, spotting, clots or missed periods). This is for your caregiver to better evaluate your problem. SEEK MEDICAL CARE IF:   You develop nausea  (feeling sick to your stomach) and vomiting, dizziness, or diarrhea while you are taking your medicine.  You are getting lightheaded or weak.  You have any problems that may be related to the medicine you are taking.  You develop pain with your DUB.  You want to remove your IUD.  You want to stop or change your birth control pills or hormones.  You have any type of abnormal bleeding mentioned above.  You are over 23 years old and have not had a menstrual period yet.  You are 34 years old and you are still having menstrual periods.  You have any of the symptoms mentioned above.  You develop a rash. SEEK IMMEDIATE MEDICAL CARE IF:   An oral temperature above 102 F (38.9 C) develops.  You develop chills.  You are changing your sanitary pad or tampon more than once an hour.  You develop abdominal pain.  You pass out or faint. Document Released: 05/27/2000 Document Revised: 08/22/2011 Document Reviewed: 04/28/2009 [facility] Patient Information 2015 [place], [place]. This information is not intended to replace advice given to you by your health care provider. Make sure you discuss any questions you have with your health care provider.

## 2015-01-12 NOTE — MAU Note (Signed)
CRITICAL VALUE ALERT  Critical value received:  hgb6.7Date of notification:  01/12/2015 Time of notification:  2518  Critical value read back: yes yes Nurse who received alert:  Mingo Amber RNMDTime of first page:  1614  MD notified (2nd page): N/A Rockwell Alexandria CNM in room at time of notification  Time of second page:N/A  Responding MD: Rockwell Alexandria CNM  Time MD responded: At notification

## 2015-01-12 NOTE — MAU Note (Signed)
Bleeding for 16 days, began feeling weak yesterday.  Denies pain today, had a lot of cramping yesterday.

## 2015-01-21 ENCOUNTER — Encounter: Payer: Self-pay | Admitting: Obstetrics & Gynecology

## 2015-07-02 ENCOUNTER — Encounter (HOSPITAL_COMMUNITY): Payer: Self-pay | Admitting: Emergency Medicine

## 2015-07-02 ENCOUNTER — Emergency Department (HOSPITAL_COMMUNITY)
Admission: EM | Admit: 2015-07-02 | Discharge: 2015-07-02 | Disposition: A | Discharge status: Home / Self Care | Payer: Self-pay | Attending: Emergency Medicine | Admitting: Emergency Medicine

## 2015-07-02 DIAGNOSIS — Z79899 Other long term (current) drug therapy: Secondary | ICD-10-CM | POA: Insufficient documentation

## 2015-07-02 DIAGNOSIS — J029 Acute pharyngitis, unspecified: Secondary | ICD-10-CM | POA: Insufficient documentation

## 2015-07-02 DIAGNOSIS — R112 Nausea with vomiting, unspecified: Secondary | ICD-10-CM | POA: Insufficient documentation

## 2015-07-02 DIAGNOSIS — Z86018 Personal history of other benign neoplasm: Secondary | ICD-10-CM | POA: Insufficient documentation

## 2015-07-02 DIAGNOSIS — R000 Tachycardia, unspecified: Secondary | ICD-10-CM | POA: Insufficient documentation

## 2015-07-02 DIAGNOSIS — R509 Fever, unspecified: Secondary | ICD-10-CM

## 2015-07-02 DIAGNOSIS — D649 Anemia, unspecified: Secondary | ICD-10-CM | POA: Insufficient documentation

## 2015-07-02 DIAGNOSIS — Z793 Long term (current) use of hormonal contraceptives: Secondary | ICD-10-CM | POA: Insufficient documentation

## 2015-07-02 DIAGNOSIS — Z8619 Personal history of other infectious and parasitic diseases: Secondary | ICD-10-CM | POA: Insufficient documentation

## 2015-07-02 DIAGNOSIS — Z3202 Encounter for pregnancy test, result negative: Secondary | ICD-10-CM | POA: Insufficient documentation

## 2015-07-02 DIAGNOSIS — R5081 Fever presenting with conditions classified elsewhere: Secondary | ICD-10-CM | POA: Insufficient documentation

## 2015-07-02 LAB — RAPID STREP SCREEN (MED CTR MEBANE ONLY): Streptococcus, Group A Screen (Direct): NEGATIVE

## 2015-07-02 LAB — I-STAT BETA HCG BLOOD, ED (MC, WL, AP ONLY)

## 2015-07-02 LAB — MONONUCLEOSIS SCREEN: MONO SCREEN: NEGATIVE

## 2015-07-02 MED ORDER — ONDANSETRON 4 MG PO TBDP: 4.0000 mg | ORAL_TABLET | Freq: Three times a day (TID) | ORAL | Status: DC | PRN

## 2015-07-02 MED ORDER — LIDOCAINE VISCOUS 2 % MT SOLN: 20.0000 mL | OROMUCOSAL | Status: DC | PRN

## 2015-07-02 MED ORDER — ACETAMINOPHEN 160 MG/5ML PO SOLN
650.0000 mg | Freq: Once | ORAL | Status: AC
  Administered 2015-07-02: 650 mg via ORAL
  Filled 2015-07-02: qty 20.3

## 2015-07-02 MED ORDER — DEXAMETHASONE SODIUM PHOSPHATE 10 MG/ML IJ SOLN
10.0000 mg | Freq: Once | INTRAMUSCULAR | Status: AC
  Administered 2015-07-02: 10 mg via INTRAVENOUS
  Filled 2015-07-02: qty 1

## 2015-07-02 MED ORDER — SODIUM CHLORIDE 0.9 % IV BOLUS (SEPSIS)
1000.0000 mL | Freq: Once | INTRAVENOUS | Status: AC
  Administered 2015-07-02: 1000 mL via INTRAVENOUS

## 2015-07-02 MED ORDER — KETOROLAC TROMETHAMINE 30 MG/ML IJ SOLN
30.0000 mg | Freq: Once | INTRAMUSCULAR | Status: AC
  Administered 2015-07-02: 30 mg via INTRAVENOUS
  Filled 2015-07-02: qty 1

## 2015-07-02 MED ORDER — PENICILLIN G BENZATHINE 1200000 UNIT/2ML IM SUSP
1.2000 10*6.[IU] | Freq: Once | INTRAMUSCULAR | Status: AC
  Administered 2015-07-02: 1.2 10*6.[IU] via INTRAMUSCULAR
  Filled 2015-07-02: qty 2

## 2015-07-02 MED ORDER — LIDOCAINE VISCOUS 2 % MT SOLN
15.0000 mL | Freq: Once | OROMUCOSAL | Status: AC
  Administered 2015-07-02: 15 mL via OROMUCOSAL
  Filled 2015-07-02: qty 15

## 2015-07-02 NOTE — ED Provider Notes (Signed)
CSN: [id number]     Arrival date & time 07/02/15  0631 History   First MD Initiated Contact with Patient 07/02/15 0701     Chief Complaint  Patient presents with  . Fever  . Sore Throat  . Fatigue    HPI  Patient presents with concern of generalized illness, including nausea, vomiting, sore throat, fatigue. Symptoms began about 2 days ago, since onset has been progressive. Patient is unaware of fever, denies confusion, disorientation. Since onset, no relief with OTC medication.   Past Medical History  Diagnosis Date  . Anemia   . Fibroids   . Gonorrhea    Past Surgical History  Procedure Laterality Date  . No past surgeries    . Tibia im nail insertion Left 08/07/2013    Procedure: INTRAMEDULLARY (IM) NAIL TIBIAL;  Surgeon: Mauri Pole, MD;  Location: [facility];  Service: Orthopedics;  Laterality: Left;   Family History  Problem Relation Age of Onset  . Hypertension Other    Social History  Substance Use Topics  . Smoking status: Never Smoker   . Smokeless tobacco: Never Used  . Alcohol Use: No   OB History    Gravida Para Term Preterm AB TAB SAB Ectopic Multiple Living   0 0 0 0 0 0 0 0 0 0      Review of Systems  Constitutional:       Per HPI, otherwise negative  HENT:       Per HPI, otherwise negative  Respiratory:       Per HPI, otherwise negative  Cardiovascular:       Per HPI, otherwise negative  Gastrointestinal: Positive for nausea and vomiting.  Endocrine:       Negative aside from HPI  Genitourinary:       Neg aside from HPI   Musculoskeletal:       Per HPI, otherwise negative  Skin: Negative.   Neurological: Negative for syncope.      Allergies  Review of patient's allergies indicates no known allergies.  Home Medications   Prior to Admission medications   Medication Sig Start Date End Date Taking? Authorizing Provider  ferrous sulfate 325 (65 FE) MG tablet Take 1 tablet (325 mg total) by mouth 2 (two) times daily with a meal. 01/12/15    Keitha Butte, CNM  fluconazole (DIFLUCAN) 150 MG tablet Take 1 tablet (150 mg total) by mouth daily. Patient not taking: Reported on 01/12/2015 09/04/14   [name], NP  ibuprofen (ADVIL,MOTRIN) 200 MG tablet Take 400 mg by mouth every 8 (eight) hours as needed for mild pain.    Historical Provider, MD  norgestimate-ethinyl estradiol ([place] 28) 0.25-35 MG-MCG tablet Take 1 tablet by mouth daily. 01/12/15   Keitha Butte, CNM  penicillin v potassium (VEETID) 500 MG tablet Take 1 tablet (500 mg total) by mouth 3 (three) times daily. Patient not taking: Reported on 01/12/2015 09/22/14   Charlann Lange, PA-C  Probiotic Product (PROBIOTIC PO) Take 1 capsule by mouth daily.    Historical Provider, MD  traMADol (ULTRAM) 50 MG tablet Take 1 tablet (50 mg total) by mouth every 6 (six) hours as needed. Patient not taking: Reported on 01/12/2015 09/22/14   Charlann Lange, PA-C   BP 134/104 mmHg  Pulse 128  Temp(Src) 103 F (39.4 C) (Oral)  Resp 18  Ht 5\' 11"  (1.803 m)  Wt 170 lb (77.111 kg)  BMI 23.72 kg/m2  SpO2 100% Physical Exam  Constitutional: She is oriented  to person, place, and time. She appears well-developed and well-nourished. No distress.  HENT:  Head: Normocephalic and atraumatic.  Posterior oral pharyngeal erythema, exudate, no asymmetry.   Eyes: Conjunctivae and EOM are normal.  Cardiovascular: Regular rhythm.  Tachycardia present.   Pulmonary/Chest: Effort normal and breath sounds normal. No stridor. No respiratory distress.  Abdominal: She exhibits no distension. There is no tenderness.  Musculoskeletal: She exhibits no edema.  Lymphadenopathy:    She has cervical adenopathy.       Right cervical: Superficial cervical adenopathy present.       Left cervical: Superficial cervical adenopathy present.  Neurological: She is alert and oriented to person, place, and time. No cranial nerve deficit.  Skin: Skin is warm and dry.  Psychiatric: She has a normal mood and affect.   Nursing note and vitals reviewed.   ED Course  Procedures (including critical care time) Labs Review Labs Reviewed  RAPID STREP SCREEN (NOT AT [facility])  CULTURE, GROUP A STREP [facility])  MONONUCLEOSIS SCREEN  I-STAT BETA HCG BLOOD, ED ([facility], [facility], [facility] ONLY)    Following the initial evaluation, with persistent tachycardia, fever, patient received fluids, Tylenol, Toradol, Decadron, viscous lidocaine.   9:19 AM Heart rate 95, temperature 99.7 Patient states that she feels better. We discussed all findings, including the initial strep test. We discussed empiric therapy with antibiotics.  MDM  Previously well young female presents with generalized illness, sore throat, nausea, vomiting. Here the patient is awake, alert. Initially she is tachycardic, febrile, and with exudative pharyngitis, adenopathy, there suspicion for strep throat. Patient received initial antibiotics, steroids, Toradol, lidocaine, with improvement in her condition. Patient discharged in stable condition.  Carmin Muskrat, MD 07/02/15 [id number]

## 2015-07-02 NOTE — ED Notes (Signed)
Patient here with complaints of fever, sore throat, cough, and emesis for three days

## 2015-07-02 NOTE — Discharge Instructions (Signed)
As discussed, your illness is most consistent with strep throat.  You have received the antibiotics, initial medication here.  Please monitor your condition carefully, and do not hesitate to return here for concerning changes in your condition.   Pharyngitis Pharyngitis is redness, pain, and swelling (inflammation) of your pharynx.  CAUSES  Pharyngitis is usually caused by infection. Most of the time, these infections are from viruses (viral) and are part of a cold. However, sometimes pharyngitis is caused by bacteria (bacterial). Pharyngitis can also be caused by allergies. Viral pharyngitis may be spread from person to person by coughing, sneezing, and personal items or utensils (cups, forks, spoons, toothbrushes). Bacterial pharyngitis may be spread from person to person by more intimate contact, such as kissing.  SIGNS AND SYMPTOMS  Symptoms of pharyngitis include:   Sore throat.   Tiredness (fatigue).   Low-grade fever.   Headache.  Joint pain and muscle aches.  Skin rashes.  Swollen lymph nodes.  Plaque-like film on throat or tonsils (often seen with bacterial pharyngitis). DIAGNOSIS  Your health care provider will ask you questions about your illness and your symptoms. Your medical history, along with a physical exam, is often all that is needed to diagnose pharyngitis. Sometimes, a rapid strep test is done. Other lab tests may also be done, depending on the suspected cause.  TREATMENT  Viral pharyngitis will usually get better in 3-4 days without the use of medicine. Bacterial pharyngitis is treated with medicines that kill germs (antibiotics).  HOME CARE INSTRUCTIONS   Drink enough water and fluids to keep your urine clear or pale yellow.   Only take over-the-counter or prescription medicines as directed by your health care provider:   If you are prescribed antibiotics, make sure you finish them even if you start to feel better.   Do not take aspirin.   Get  lots of rest.   Gargle with 8 oz of salt water ( tsp of salt per 1 qt of water) as often as every 1-2 hours to soothe your throat.   Throat lozenges (if you are not at risk for choking) or sprays may be used to soothe your throat. SEEK MEDICAL CARE IF:   You have large, tender lumps in your neck.  You have a rash.  You cough up green, yellow-brown, or bloody spit. SEEK IMMEDIATE MEDICAL CARE IF:   Your neck becomes stiff.  You drool or are unable to swallow liquids.  You vomit or are unable to keep medicines or liquids down.  You have severe pain that does not go away with the use of recommended medicines.  You have trouble breathing (not caused by a stuffy nose). MAKE SURE YOU:   Understand these instructions.  Will watch your condition.  Will get help right away if you are not doing well or get worse.   This information is not intended to replace advice given to you by your health care provider. Make sure you discuss any questions you have with your health care provider.   Document Released: 05/30/2005 Document Revised: 03/20/2013 Document Reviewed: 02/04/2013 Elsevier Interactive Patient Education [employer].

## 2015-07-04 LAB — CULTURE, GROUP A STREP (THRC)

## 2015-07-20 ENCOUNTER — Inpatient Hospital Stay (HOSPITAL_COMMUNITY)
Admission: AD | Admit: 2015-07-20 | Discharge: 2015-07-20 | Disposition: A | Discharge status: Home / Self Care | Payer: Self-pay | Source: Ambulatory Visit | Attending: Obstetrics and Gynecology | Admitting: Obstetrics and Gynecology

## 2015-07-20 DIAGNOSIS — N898 Other specified noninflammatory disorders of vagina: Secondary | ICD-10-CM | POA: Insufficient documentation

## 2015-07-20 LAB — URINALYSIS, ROUTINE W REFLEX MICROSCOPIC
Bilirubin Urine: NEGATIVE
Glucose, UA: NEGATIVE mg/dL
Ketones, ur: NEGATIVE mg/dL
NITRITE: NEGATIVE
PH: 5.5 (ref 5.0–8.0)
Protein, ur: 30 mg/dL — AB

## 2015-07-20 LAB — URINE MICROSCOPIC-ADD ON

## 2015-07-20 LAB — POCT PREGNANCY, URINE: PREG TEST UR: NEGATIVE

## 2015-07-20 NOTE — MAU Note (Signed)
Patient presents with vaginal itching and irritation.  "I have white discharge and my period just came on today."

## 2015-12-02 ENCOUNTER — Ambulatory Visit (HOSPITAL_COMMUNITY)
Admission: EM | Admit: 2015-12-02 | Discharge: 2015-12-02 | Disposition: A | Discharge status: Home / Self Care | Payer: Self-pay | Attending: Emergency Medicine | Admitting: Emergency Medicine

## 2015-12-02 ENCOUNTER — Encounter (HOSPITAL_COMMUNITY): Payer: Self-pay | Admitting: Emergency Medicine

## 2015-12-02 DIAGNOSIS — G8929 Other chronic pain: Secondary | ICD-10-CM

## 2015-12-02 DIAGNOSIS — K089 Disorder of teeth and supporting structures, unspecified: Secondary | ICD-10-CM

## 2015-12-02 DIAGNOSIS — R0982 Postnasal drip: Secondary | ICD-10-CM

## 2015-12-02 MED ORDER — AMOXICILLIN 500 MG PO CAPS: 1000.0000 mg | ORAL_CAPSULE | Freq: Two times a day (BID) | ORAL | Status: DC

## 2015-12-02 MED ORDER — HYDROCODONE-ACETAMINOPHEN 5-325 MG PO TABS: 1.0000 | ORAL_TABLET | ORAL | Status: DC | PRN

## 2015-12-02 NOTE — ED Provider Notes (Signed)
CSN: [id number]     Arrival date & time 12/02/15  1611 History   First MD Initiated Contact with Patient 12/02/15 1637     Chief Complaint  Patient presents with  . Dental Pain   (Consider location/radiation/quality/duration/timing/severity/associated sxs/prior Treatment) HPI Comments: 35 year old female complaining of pain to the left lower third molar. She is had pain for 3 months. She states she saw a dentist wants but he was unable to perform any procedures due to the patient's hyperactive gag reflex and associated anxiety.  Just attempting to approach the mouth with the tongue blade causes the patient to gag. 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   Past Medical History  Diagnosis Date  . Anemia   . Fibroids   . Gonorrhea    Past Surgical History  Procedure Laterality Date  . No past surgeries    . Tibia im nail insertion Left 08/07/2013    Procedure: INTRAMEDULLARY (IM) NAIL TIBIAL;  Surgeon: Mauri Pole, MD;  Location: [facility];  Service: Orthopedics;  Laterality: Left;   Family History  Problem Relation Age of Onset  . Hypertension Other    Social History  Substance Use Topics  . Smoking status: Never Smoker   . Smokeless tobacco: Never Used  . Alcohol Use: No   OB History    Gravida Para Term Preterm AB TAB SAB Ectopic Multiple Living   0 0 0 0 0 0 0 0 0 0      Review of Systems  Constitutional: Negative.   HENT: Positive for dental problem and postnasal drip.   Eyes: Negative.   Respiratory: Negative.   Skin: Negative.   Neurological: Negative.   Psychiatric/Behavioral: The patient is nervous/anxious.   All other systems reviewed and are negative.   Allergies  Review of patient's allergies indicates no known allergies.  Home Medications   Prior to Admission medications   Medication Sig Start Date End Date Taking? Authorizing Provider  acetaminophen (TYLENOL) 500 MG tablet Take 1,000 mg by mouth every 6 (six) hours as  needed for mild pain.    Historical Provider, MD  amoxicillin (AMOXIL) 500 MG capsule Take 2 capsules (1,000 mg total) by mouth 2 (two) times daily. 12/02/15   Janne Napoleon, NP  ferrous sulfate 325 (65 FE) MG tablet Take 1 tablet (325 mg total) by mouth 2 (two) times daily with a meal. 01/12/15   Keitha Butte, CNM  HYDROcodone-acetaminophen (NORCO/VICODIN) 5-325 MG tablet Take 1 tablet by mouth every 4 (four) hours as needed. 12/02/15   Janne Napoleon, NP  ibuprofen (ADVIL,MOTRIN) 200 MG tablet Take 400 mg by mouth every 8 (eight) hours as needed for mild pain.    Historical Provider, MD  lidocaine (XYLOCAINE) 2 % solution Use as directed 20 mLs in the mouth or throat every 4 (four) hours as needed for mouth pain. 07/02/15   Carmin Muskrat, MD  norgestimate-ethinyl estradiol ([place] 28) 0.25-35 MG-MCG tablet Take 1 tablet by mouth daily. 01/12/15   Keitha Butte, CNM  ondansetron (ZOFRAN ODT) 4 MG disintegrating tablet Take 1 tablet (4 mg total) by mouth every 8 (eight) hours as needed for nausea or vomiting. 07/02/15   Carmin Muskrat, MD  Pseudoeph-Doxylamine-DM-APAP (NYQUIL PO) Take 1 Dose by mouth at bedtime as needed (cold symptoms).    Historical Provider, MD   Meds Ordered and Administered this Visit  Medications - No data to display  BP 138/74 mmHg  Pulse 101  Temp(Src) 98.7 F (  37.1 C) (Oral)  Resp 18  SpO2 100%  LMP 11/17/2015 No data found.   Physical Exam  Constitutional: She is oriented to person, place, and time. She appears well-developed and well-nourished. No distress.  HENT:  Mouth/Throat: Oropharynx is clear and moist.  The left lower second third molars are visible. The second molar is severely decayed and he rated with central erosion into the pulp. The third molar directly inferior to the second  has broken through the gumline and appears to be crowning out the second molar. This may be a second source for her pain. No swelling, abscess formation or erythema is observed. No  facial swelling observed or palpated.   Unable to visualize the oropharynx due to the patient's large time and hypersensitive gag reflex.   Eyes: EOM are normal.  Neck: Normal range of motion. Neck supple.  Cardiovascular: Normal rate.   Pulmonary/Chest: Effort normal.  Lymphadenopathy:    She has no cervical adenopathy.  Neurological: She is oriented to person, place, and time. She exhibits normal muscle tone.  Skin: Skin is warm and dry.  Nursing note and vitals reviewed.   ED Course  Procedures (including critical care time)  Labs Review Labs Reviewed - No data to display  Imaging Review No results found.   Visual Acuity Review  Right Eye Distance:   Left Eye Distance:   Bilateral Distance:    Right Eye Near:   Left Eye Near:    Bilateral Near:         MDM   1. Chronic dental pain   2. PND (post-nasal drip)    For the drainage in the back of your throat you may take either Zyrtec or Claritin or Allegra. If this is not strong enough he may take Chlor-Trimeton 2 mg or 4 mg every 4-6 hours for drainage. This may cause some drowsiness. See dentist as soon as possible Meds ordered this encounter  Medications  . HYDROcodone-acetaminophen (NORCO/VICODIN) 5-325 MG tablet    Sig: Take 1 tablet by mouth every 4 (four) hours as needed.    Dispense:  15 tablet    Refill:  0    Order Specific Question:  Supervising Provider    Answer:  Melony Overly [id number]  . amoxicillin (AMOXIL) 500 MG capsule    Sig: Take 2 capsules (1,000 mg total) by mouth 2 (two) times daily.    Dispense:  30 capsule    Refill:  0    Order Specific Question:  Supervising Provider    Answer:  Melony Overly [id number]     Janne Napoleon, NP 12/02/15 1718

## 2015-12-02 NOTE — Discharge Instructions (Signed)
For the drainage in the back of your throat it may take either Zyrtec or Claritin or Allegra. If this is not strong enough he may take Chlor-Trimeton 2 mg or 4 mg every 4-6 hours for drainage. This may cause some drowsiness.

## 2015-12-02 NOTE — ED Notes (Signed)
C/o left bottom tooth pain States she feels as if her wisdom teeth are coming in Has no dentist available

## 2016-07-25 ENCOUNTER — Emergency Department (HOSPITAL_COMMUNITY)
Admission: EM | Admit: 2016-07-25 | Discharge: 2016-07-25 | Disposition: A | Discharge status: Home / Self Care | Payer: Self-pay | Attending: Emergency Medicine | Admitting: Emergency Medicine

## 2016-07-25 ENCOUNTER — Encounter (HOSPITAL_COMMUNITY): Payer: Self-pay | Admitting: Emergency Medicine

## 2016-07-25 DIAGNOSIS — J069 Acute upper respiratory infection, unspecified: Secondary | ICD-10-CM | POA: Insufficient documentation

## 2016-07-25 MED ORDER — BENZONATATE 100 MG PO CAPS: 100.0000 mg | ORAL_CAPSULE | Freq: Three times a day (TID) | ORAL | 0 refills | Status: DC

## 2016-07-25 NOTE — ED Provider Notes (Signed)
[facility] DEPT Provider Note   CSN: [id number] Arrival date & time: 07/25/16  1010  By signing my name below, I, Kaitlyn Hays, attest that this documentation has been prepared under the direction and in the presence of non-physician practitioner, Janetta Hora, PA-C. Electronically Signed: Jeanell Hays, Scribe. 07/25/2016. 11:21 AM.  History   Chief Complaint Chief Complaint  Patient presents with  . Cough  . Headache  . Nasal Congestion   The history is provided by the patient. No language interpreter was used.   HPI Comments: Kaitlyn Hays is a 36 y.o. female who presents to the Emergency Department complaining of intermittent moderate cough that started yesterday. She states she has been having gradually worsening URI-like symptoms. She has been taking tylenol with minimal relief. She describes the cough as non-productive. She reports associated symptoms of rhinorrhea, nasal congestion, chills, abdominal pain, nausea, generalized myalgias, and headache. She admits to sick contacts. She denies any ear pain, sore throat, vomiting, or other complaints.   Past Medical History:  Diagnosis Date  . Anemia   . Fibroids   . Gonorrhea     Patient Active Problem List   Diagnosis Date Noted  . DUB (dysfunctional uterine bleeding) 01/12/2015  . Anemia 01/12/2015  . Tibia fracture 08/07/2013  . Encounter for routine gynecological examination 11/29/2012  . Vaginal discharge 02/03/2012  . Iron deficiency anemia 11/01/2011  . Fibroids 11/01/2011  . Lightheadedness 11/01/2011  . ANEMIA 07/08/2009    Past Surgical History:  Procedure Laterality Date  . NO PAST SURGERIES    . TIBIA IM NAIL INSERTION Left 08/07/2013   Procedure: INTRAMEDULLARY (IM) NAIL TIBIAL;  Surgeon: Mauri Pole, MD;  Location: [facility];  Service: Orthopedics;  Laterality: Left;    OB History    Gravida Para Term Preterm AB Living   0 0 0 0 0 0   SAB TAB Ectopic Multiple Live Births   0 0 0 0          Home Medications    Prior to Admission medications   Medication Sig Start Date End Date Taking? Authorizing Provider  acetaminophen (TYLENOL) 500 MG tablet Take 1,000 mg by mouth every 6 (six) hours as needed for mild pain.    Historical Provider, MD  amoxicillin (AMOXIL) 500 MG capsule Take 2 capsules (1,000 mg total) by mouth 2 (two) times daily. 12/02/15   Janne Napoleon, NP  ferrous sulfate 325 (65 FE) MG tablet Take 1 tablet (325 mg total) by mouth 2 (two) times daily with a meal. 01/12/15   Keitha Butte, CNM  HYDROcodone-acetaminophen (NORCO/VICODIN) 5-325 MG tablet Take 1 tablet by mouth every 4 (four) hours as needed. 12/02/15   Janne Napoleon, NP  ibuprofen (ADVIL,MOTRIN) 200 MG tablet Take 400 mg by mouth every 8 (eight) hours as needed for mild pain.    Historical Provider, MD  lidocaine (XYLOCAINE) 2 % solution Use as directed 20 mLs in the mouth or throat every 4 (four) hours as needed for mouth pain. 07/02/15   Carmin Muskrat, MD  norgestimate-ethinyl estradiol ([place] 28) 0.25-35 MG-MCG tablet Take 1 tablet by mouth daily. 01/12/15   Keitha Butte, CNM  ondansetron (ZOFRAN ODT) 4 MG disintegrating tablet Take 1 tablet (4 mg total) by mouth every 8 (eight) hours as needed for nausea or vomiting. 07/02/15   Carmin Muskrat, MD  Pseudoeph-Doxylamine-DM-APAP (NYQUIL PO) Take 1 Dose by mouth at bedtime as needed (cold symptoms).    Historical Provider, MD    [facility]  History Family History  Problem Relation Age of Onset  . Hypertension Other     Social History Social History  Substance Use Topics  . Smoking status: Never Smoker  . Smokeless tobacco: Never Used  . Alcohol use No     Allergies   Patient has no known allergies.   Review of Systems Review of Systems  Constitutional: Positive for chills.  HENT: Positive for congestion (Nasal) and rhinorrhea. Negative for ear pain and sore throat.   Respiratory: Positive for cough (Non-productive).   Gastrointestinal: Positive  for abdominal pain and nausea. Negative for vomiting.  Musculoskeletal: Positive for myalgias (Generalized).  Neurological: Positive for headaches.     Physical Exam Updated Vital Signs BP 114/82 (BP Location: Left Arm)   Pulse 98   Temp 97.8 F (36.6 C) (Oral)   Resp 18   Ht 5\' 11"  (1.803 m)   Wt 203 lb (92.1 kg)   LMP 06/29/2016   SpO2 98%   BMI 28.31 kg/m   Physical Exam  Constitutional: She appears well-developed and well-nourished. No distress.  HENT:  Head: Normocephalic and atraumatic.  Right Ear: Hearing, tympanic membrane, external ear and ear canal normal.  Left Ear: Hearing, tympanic membrane, external ear and ear canal normal.  Nose: Mucosal edema and rhinorrhea present.  Mouth/Throat: Uvula is midline, oropharynx is clear and moist and mucous membranes are normal.  Eyes: Conjunctivae are normal.  Neck: Neck supple.  Cardiovascular: Normal rate and regular rhythm.  Exam reveals no gallop and no friction rub.   No murmur heard. Pulmonary/Chest: Effort normal and breath sounds normal. No respiratory distress. She has no wheezes. She has no rales. She exhibits no tenderness.  Abdominal: Soft.  Musculoskeletal: Normal range of motion.  Neurological: She is alert.  Skin: Skin is warm and dry.  Psychiatric: She has a normal mood and affect.  Nursing note and vitals reviewed.    ED Treatments / Results  DIAGNOSTIC STUDIES: Oxygen Saturation is 98% on RA, normal by my interpretation.    COORDINATION OF CARE: 11:25 AM- Pt advised of plan for treatment and pt agrees.  Labs (all labs ordered are listed, but only abnormal results are displayed) Labs Reviewed - No data to display  EKG  EKG Interpretation None       Radiology No results found.  Procedures Procedures (including critical care time)  Medications Ordered in ED Medications - No data to display   Initial Impression / Assessment and Plan / ED Course  I have reviewed the triage vital signs  and the nursing notes.  Pertinent labs & imaging results that were available during my care of the patient were reviewed by me and considered in my medical decision making (see chart for details).  Patients symptoms are consistent with URI, likely viral etiology. Discussed that antibiotics are not indicated for viral infections. Pt will be discharged with symptomatic treatment - given Tessalon for cough.  Verbalizes understanding and is agreeable with plan. Pt is hemodynamically stable & in NAD prior to dc.  Final Clinical Impressions(s) / ED Diagnoses   Final diagnoses:  Upper respiratory tract infection, unspecified type    New Prescriptions Discharge Medication List as of 07/25/2016 11:31 AM    START taking these medications   Details  benzonatate (TESSALON) 100 MG capsule Take 1 capsule (100 mg total) by mouth every 8 (eight) hours., Starting Mon 07/25/2016, Print       I personally performed the services described in this documentation, which was scribed  in my presence. The recorded information has been reviewed and is accurate.     Recardo Evangelist, PA-C 07/27/16 1640    Lajean Saver, MD 07/27/16 [phone]

## 2016-07-25 NOTE — Discharge Instructions (Signed)
Rest and drink plenty of fluids Continue Tylenol for pain/fever Take Tessalon for cough Take Benadryl for sleep

## 2016-07-25 NOTE — ED Triage Notes (Signed)
Patient c/o cough that is congested but nothing will come up, patient c/o chest pain with cough, patient has nasal congestion and headache.  Patient also reports more gas out of both ends then her normal.  Patient feelsing bad for couple days.

## 2017-07-06 ENCOUNTER — Ambulatory Visit (HOSPITAL_COMMUNITY)
Admission: EM | Admit: 2017-07-06 | Discharge: 2017-07-06 | Disposition: A | Discharge status: Home / Self Care | Payer: Self-pay | Attending: Family Medicine | Admitting: Family Medicine

## 2017-07-06 ENCOUNTER — Encounter (HOSPITAL_COMMUNITY): Payer: Self-pay | Admitting: Family Medicine

## 2017-07-06 ENCOUNTER — Telehealth (HOSPITAL_COMMUNITY): Payer: Self-pay | Admitting: Family Medicine

## 2017-07-06 DIAGNOSIS — N898 Other specified noninflammatory disorders of vagina: Secondary | ICD-10-CM

## 2017-07-06 DIAGNOSIS — Z3202 Encounter for pregnancy test, result negative: Secondary | ICD-10-CM

## 2017-07-06 DIAGNOSIS — R51 Headache: Secondary | ICD-10-CM | POA: Insufficient documentation

## 2017-07-06 DIAGNOSIS — R002 Palpitations: Secondary | ICD-10-CM

## 2017-07-06 DIAGNOSIS — R519 Headache, unspecified: Secondary | ICD-10-CM

## 2017-07-06 LAB — POCT URINALYSIS DIP (DEVICE)
BILIRUBIN URINE: NEGATIVE
GLUCOSE, UA: NEGATIVE mg/dL
Hgb urine dipstick: NEGATIVE
KETONES UR: NEGATIVE mg/dL
NITRITE: NEGATIVE
Protein, ur: 100 mg/dL — AB
Specific Gravity, Urine: 1.02 (ref 1.005–1.030)
Urobilinogen, UA: 1 mg/dL (ref 0.0–1.0)
pH: 7.5 (ref 5.0–8.0)

## 2017-07-06 LAB — POCT PREGNANCY, URINE: PREG TEST UR: NEGATIVE

## 2017-07-06 MED ORDER — METRONIDAZOLE 500 MG PO TABS: 500.0000 mg | ORAL_TABLET | Freq: Two times a day (BID) | ORAL | 0 refills | Status: DC

## 2017-07-06 MED ORDER — FLUCONAZOLE 150 MG PO TABS: 150.0000 mg | ORAL_TABLET | Freq: Once | ORAL | 1 refills | Status: AC

## 2017-07-06 MED ORDER — BUTALBITAL-APAP-CAFFEINE 50-325-40 MG PO TABS: 1.0000 | ORAL_TABLET | Freq: Two times a day (BID) | ORAL | 0 refills | Status: AC | PRN

## 2017-07-06 NOTE — ED Provider Notes (Signed)
[place]   [id number] 07/06/17 Arrival Time: 1406   SUBJECTIVE:  Kaitlyn Hays is a 37 y.o. female who presents to the urgent care with complaint of headache x 1 week. sts that it has been waking her up out of her sleep.   Pt also sts x 2 months that she has been having intermittent palpitations and heart racing. that she believes is brought on by anxiety.   Felt some lightheadedness with it on her drive over.  Asymptomatic in office.  Vaginal discharge x 1 week. sts clear discharge without odor.   Works as Audiological scientist.  Past Medical History:  Diagnosis Date  . Anemia   . Fibroids   . Gonorrhea    Family History  Problem Relation Age of Onset  . Hypertension Other    Social History   Socioeconomic History  . Marital status: Single    Spouse name: Not on file  . Number of children: Not on file  . Years of education: Not on file  . Highest education level: Not on file  Social Needs  . Financial resource strain: Not on file  . Food insecurity - worry: Not on file  . Food insecurity - inability: Not on file  . Transportation needs - medical: Not on file  . Transportation needs - non-medical: Not on file  Occupational History  . Not on file  Tobacco Use  . Smoking status: Never Smoker  . Smokeless tobacco: Never Used  Substance and Sexual Activity  . Alcohol use: No  . Drug use: No  . Sexual activity: Yes    Partners: Female  Other Topics Concern  . Not on file  Social History Narrative  . Not on file   No outpatient medications have been marked as taking for the 07/06/17 encounter [facility] Encounter).   No Known Allergies    ROS: As per HPI, remainder of ROS negative.   OBJECTIVE:   Vitals:   07/06/17 1428  BP: 107/71  Pulse: (!) 110  Resp: 18  Temp: 98.2 F (36.8 C)  SpO2: 100%     General appearance: alert; no distress Eyes: PERRL; EOMI; conjunctiva normal; normal fundi HENT: normocephalic; atraumatic; TMs normal,  canal normal, external ears normal without trauma; nasal mucosa normal; oral mucosa normal Neck: supple Lungs: clear to auscultation bilaterally Heart: regular rate and rhythm Back: no CVA tenderness Extremities: no cyanosis or edema; symmetrical with no gross deformities Skin: warm and dry Neurologic: normal gait; grossly normal Psychological: alert and cooperative; normal mood and affect   Labs:  Results for orders placed or performed during the hospital encounter of 07/06/17  POCT urinalysis dip (device)  Result Value Ref Range   Glucose, UA NEGATIVE NEGATIVE mg/dL   Bilirubin Urine NEGATIVE NEGATIVE   Ketones, ur NEGATIVE NEGATIVE mg/dL   Specific Gravity, Urine 1.020 1.005 - 1.030   Hgb urine dipstick NEGATIVE NEGATIVE   pH 7.5 5.0 - 8.0   Protein, ur 100 (A) NEGATIVE mg/dL   Urobilinogen, UA 1.0 0.0 - 1.0 mg/dL   Nitrite NEGATIVE NEGATIVE   Leukocytes, UA TRACE (A) NEGATIVE  Pregnancy, urine POC  Result Value Ref Range   Preg Test, Ur NEGATIVE NEGATIVE    Labs Reviewed  POCT URINALYSIS DIP (DEVICE) - Abnormal; Notable for the following components:      Result Value   Protein, ur 100 (*)    Leukocytes, UA TRACE (*)    All other components within normal limits  POCT PREGNANCY, URINE  URINE CYTOLOGY ANCILLARY ONLY    No results found.     ASSESSMENT & PLAN:  1. Vaginal discharge   2. Bad headache   3. Palpitations     No orders of the defined types were placed in this encounter.   Reviewed expectations re: course of current medical issues. Questions answered. Outlined signs and symptoms indicating need for more acute intervention. Patient verbalized understanding. After Visit Summary given.   The vaginal discharge is most consistent with bacterial vaginitis (BV)  No serious heart problem seen on cardiogram.  Headache is consistent with tension headache   Robyn Haber, MD 07/06/17 1516

## 2017-07-06 NOTE — Discharge Instructions (Signed)
The vaginal discharge is most consistent with bacterial vaginitis (BV)  No serious heart problem seen on cardiogram.  Headache is consistent with tension headache

## 2017-07-06 NOTE — ED Triage Notes (Signed)
Pt here for headache x 1 week. sts that it has been waking her up out of her sleep.   Pt also sts x 2 months that she has been having intermittent palpitations and hear racing. that she believes is brought on by anxiety.   Vaginal discharge x 1 week. sts clear discharge without odor.

## 2017-07-07 LAB — URINE CYTOLOGY ANCILLARY ONLY
Chlamydia: NEGATIVE
Neisseria Gonorrhea: NEGATIVE
Trichomonas: NEGATIVE

## 2017-07-10 LAB — URINE CYTOLOGY ANCILLARY ONLY: Candida vaginitis: POSITIVE — AB

## 2017-12-11 ENCOUNTER — Ambulatory Visit (HOSPITAL_COMMUNITY)
Admission: EM | Admit: 2017-12-11 | Discharge: 2017-12-11 | Disposition: A | Discharge status: Home / Self Care | Payer: Self-pay | Attending: Family Medicine | Admitting: Family Medicine

## 2017-12-11 ENCOUNTER — Other Ambulatory Visit: Payer: Self-pay

## 2017-12-11 ENCOUNTER — Encounter (HOSPITAL_COMMUNITY): Payer: Self-pay | Admitting: Emergency Medicine

## 2017-12-11 DIAGNOSIS — D259 Leiomyoma of uterus, unspecified: Secondary | ICD-10-CM | POA: Insufficient documentation

## 2017-12-11 DIAGNOSIS — Z9889 Other specified postprocedural states: Secondary | ICD-10-CM | POA: Insufficient documentation

## 2017-12-11 DIAGNOSIS — N898 Other specified noninflammatory disorders of vagina: Secondary | ICD-10-CM | POA: Insufficient documentation

## 2017-12-11 DIAGNOSIS — D509 Iron deficiency anemia, unspecified: Secondary | ICD-10-CM | POA: Insufficient documentation

## 2017-12-11 DIAGNOSIS — Z8249 Family history of ischemic heart disease and other diseases of the circulatory system: Secondary | ICD-10-CM | POA: Insufficient documentation

## 2017-12-11 DIAGNOSIS — Z79899 Other long term (current) drug therapy: Secondary | ICD-10-CM | POA: Insufficient documentation

## 2017-12-11 DIAGNOSIS — N938 Other specified abnormal uterine and vaginal bleeding: Secondary | ICD-10-CM | POA: Insufficient documentation

## 2017-12-11 MED ORDER — AZITHROMYCIN 250 MG PO TABS
ORAL_TABLET | ORAL | Status: AC
  Filled 2017-12-11: qty 4

## 2017-12-11 MED ORDER — METRONIDAZOLE 500 MG PO TABS: 500.0000 mg | ORAL_TABLET | Freq: Two times a day (BID) | ORAL | 0 refills | Status: DC

## 2017-12-11 MED ORDER — LIDOCAINE HCL (PF) 1 % IJ SOLN
INTRAMUSCULAR | Status: AC
  Filled 2017-12-11: qty 2

## 2017-12-11 MED ORDER — CEFTRIAXONE SODIUM 250 MG IJ SOLR
INTRAMUSCULAR | Status: AC
  Filled 2017-12-11: qty 250

## 2017-12-11 MED ORDER — AZITHROMYCIN 250 MG PO TABS
1000.0000 mg | ORAL_TABLET | Freq: Once | ORAL | Status: AC
  Administered 2017-12-11: 1000 mg via ORAL

## 2017-12-11 MED ORDER — FLUCONAZOLE 150 MG PO TABS: 150.0000 mg | ORAL_TABLET | Freq: Every day | ORAL | 0 refills | Status: DC

## 2017-12-11 MED ORDER — CEFTRIAXONE SODIUM 250 MG IJ SOLR
250.0000 mg | Freq: Once | INTRAMUSCULAR | Status: AC
  Administered 2017-12-11: 250 mg via INTRAMUSCULAR

## 2017-12-11 NOTE — ED Provider Notes (Signed)
[facility]    CSN: [id number] Arrival date & time: 12/11/17  [place]     History   Chief Complaint Chief Complaint  Patient presents with  . Vaginal Discharge  . vaginal irritation    HPI Kaitlyn Hays is a 37 y.o. female.   37 year old female comes in for 2-day history of vaginal discharge and irritation.  She denies fever, chills, night sweats.  States she had low abdominal cramping that has since resolved.  Denies nausea, vomiting.  Denies urinary symptoms such as frequency, dysuria, hematuria.  Sexually active with one female partner, no condom use.  Denies birth control use.  LMP 11/29/2017.     Past Medical History:  Diagnosis Date  . Anemia   . Fibroids   . Gonorrhea     Patient Active Problem List   Diagnosis Date Noted  . DUB (dysfunctional uterine bleeding) 01/12/2015  . Anemia 01/12/2015  . Tibia fracture 08/07/2013  . Encounter for routine gynecological examination 11/29/2012  . Vaginal discharge 02/03/2012  . Iron deficiency anemia 11/01/2011  . Fibroids 11/01/2011  . Lightheadedness 11/01/2011  . ANEMIA 07/08/2009    Past Surgical History:  Procedure Laterality Date  . NO PAST SURGERIES    . TIBIA IM NAIL INSERTION Left 08/07/2013   Procedure: INTRAMEDULLARY (IM) NAIL TIBIAL;  Surgeon: Mauri Pole, MD;  Location: [facility];  Service: Orthopedics;  Laterality: Left;    OB History    Gravida  0   Para  0   Term  0   Preterm  0   AB  0   Living  0     SAB  0   TAB  0   Ectopic  0   Multiple  0   Live Births               Home Medications    Prior to Admission medications   Medication Sig Start Date End Date Taking? Authorizing Provider  acetaminophen (TYLENOL) 500 MG tablet Take 1,000 mg by mouth every 6 (six) hours as needed for mild pain.    [provider]  butalbital-acetaminophen-caffeine (FIORICET, ESGIC) [phone] MG tablet Take 1-2 tablets by mouth 2 (two) times daily as needed for headache.  07/06/17 07/06/18  Robyn Haber, MD  ferrous sulfate 325 (65 FE) MG tablet Take 1 tablet (325 mg total) by mouth 2 (two) times daily with a meal. 01/12/15   Keitha Butte, CNM  fluconazole (DIFLUCAN) 150 MG tablet Take 1 tablet (150 mg total) by mouth daily. Take second dose 72 hours later if symptoms still persists. 12/11/17   Tasia Catchings, Deonte Otting V, PA-C  metroNIDAZOLE (FLAGYL) 500 MG tablet Take 1 tablet (500 mg total) by mouth 2 (two) times daily. 12/11/17   Ok Edwards, PA-C    Family History Family History  Problem Relation Age of Onset  . Hypertension Other     Social History Social History   Tobacco Use  . Smoking status: Never Smoker  . Smokeless tobacco: Never Used  Substance Use Topics  . Alcohol use: No  . Drug use: No     Allergies   Patient has no known allergies.   Review of Systems Review of Systems  Reason unable to perform ROS: See HPI as above.     Physical Exam Triage Vital Signs ED Triage Vitals [12/11/17 1811]  Enc Vitals Group     BP      Pulse      Resp  Temp      Temp src      SpO2      Weight      Height      Head Circumference      Peak Flow      Pain Score 0     Pain Loc      Pain Edu?      Excl. in [place]?    No data found.  Updated Vital Signs BP 131/71 (BP Location: Right Arm)   Pulse (!) 101   Temp 98.3 F (36.8 C) (Oral)   LMP 11/29/2017 (Approximate)   SpO2 100%   Physical Exam  Constitutional: She is oriented to person, place, and time. She appears well-developed and well-nourished. No distress.  HENT:  Head: Normocephalic and atraumatic.  Eyes: Pupils are equal, round, and reactive to light. Conjunctivae are normal.  Cardiovascular: Normal rate, regular rhythm and normal heart sounds. Exam reveals no gallop and no friction rub.  No murmur heard. Pulmonary/Chest: Effort normal and breath sounds normal. She has no wheezes. She has no rales.  Abdominal: Soft. Bowel sounds are normal. She exhibits no mass. There is no tenderness.  There is no rebound, no guarding and no CVA tenderness.  Neurological: She is alert and oriented to person, place, and time.  Skin: Skin is warm and dry.  Psychiatric: She has a normal mood and affect. Her behavior is normal. Judgment normal.     UC Treatments / Results  Labs (all labs ordered are listed, but only abnormal results are displayed) Labs Reviewed  URINE CYTOLOGY ANCILLARY ONLY    EKG None  Radiology No results found.  Procedures Procedures (including critical care time)  Medications Ordered in UC Medications  azithromycin (ZITHROMAX) tablet 1,000 mg (1,000 mg Oral Given 12/11/17 1841)  cefTRIAXone (ROCEPHIN) injection 250 mg (250 mg Intramuscular Given 12/11/17 1842)    Initial Impression / Assessment and Plan / UC Course  I have reviewed the triage vital signs and the nursing notes.  Pertinent labs & imaging results that were available during my care of the patient were reviewed by me and considered in my medical decision making (see chart for details).    Patient would like to be covered for "everything". Azithromycin and Rocephin given in office today. Flagyl and diflucan as directed. Cytology sent, patient will be contacted with any positive results that require additional treatment. Patient to refrain from sexual activity for the next 7 days. Return precautions given.   Final Clinical Impressions(s) / UC Diagnoses   Final diagnoses:  Vaginal discharge  Vaginal irritation    ED Prescriptions    Medication Sig Dispense Auth. Provider   metroNIDAZOLE (FLAGYL) 500 MG tablet Take 1 tablet (500 mg total) by mouth 2 (two) times daily. 14 tablet Maki Hege V, PA-C   fluconazole (DIFLUCAN) 150 MG tablet Take 1 tablet (150 mg total) by mouth daily. Take second dose 72 hours later if symptoms still persists. 2 tablet Tobin Chad, PA-C 12/11/17 1859

## 2017-12-11 NOTE — Discharge Instructions (Signed)
You were treated empirically for gonorrhea, chlamydia, trichomonas, yeast, bacterial vaginitis.  Azithromycin 1g by mouth and Rocephin 250mg  injection given in office today.  Start Flagyl and Diflucan as directed.  Cytology sent, you will be contacted with any positive results that requires further treatment. Refrain from sexual activity and alcohol use for the next 7 days. Monitor for any worsening of symptoms, fever, abdominal pain, nausea, vomiting, to follow up for reevaluation.

## 2017-12-11 NOTE — ED Triage Notes (Signed)
Pt reports vaginal discharge and irritation for 2 days.

## 2017-12-12 LAB — URINE CYTOLOGY ANCILLARY ONLY
CHLAMYDIA, DNA PROBE: NEGATIVE
NEISSERIA GONORRHEA: NEGATIVE
Trichomonas: NEGATIVE

## 2017-12-13 LAB — URINE CYTOLOGY ANCILLARY ONLY: CANDIDA VAGINITIS: NEGATIVE

## 2017-12-15 ENCOUNTER — Telehealth (HOSPITAL_COMMUNITY): Payer: Self-pay

## 2017-12-15 NOTE — Telephone Encounter (Signed)
Bacterial Vaginosis test is positive.  Prescription for metronidazole was given at the urgent care visit. Pt contacted regarding results. Answered all questions. Verbalized understanding.   

## 2018-08-06 ENCOUNTER — Encounter (HOSPITAL_COMMUNITY): Payer: Self-pay | Admitting: Emergency Medicine

## 2018-08-06 ENCOUNTER — Ambulatory Visit (HOSPITAL_COMMUNITY)
Admission: EM | Admit: 2018-08-06 | Discharge: 2018-08-06 | Disposition: A | Discharge status: Home / Self Care | Payer: Self-pay | Attending: Family Medicine | Admitting: Family Medicine

## 2018-08-06 DIAGNOSIS — N390 Urinary tract infection, site not specified: Secondary | ICD-10-CM | POA: Insufficient documentation

## 2018-08-06 DIAGNOSIS — R000 Tachycardia, unspecified: Secondary | ICD-10-CM | POA: Insufficient documentation

## 2018-08-06 DIAGNOSIS — J40 Bronchitis, not specified as acute or chronic: Secondary | ICD-10-CM | POA: Insufficient documentation

## 2018-08-06 LAB — POCT URINALYSIS DIP (DEVICE)
Bilirubin Urine: NEGATIVE
Glucose, UA: NEGATIVE mg/dL
KETONES UR: NEGATIVE mg/dL
Nitrite: NEGATIVE
PH: 6.5 (ref 5.0–8.0)
PROTEIN: 30 mg/dL — AB
SPECIFIC GRAVITY, URINE: 1.025 (ref 1.005–1.030)
Urobilinogen, UA: 0.2 mg/dL (ref 0.0–1.0)

## 2018-08-06 MED ORDER — BENZONATATE 100 MG PO CAPS: 100.0000 mg | ORAL_CAPSULE | Freq: Three times a day (TID) | ORAL | 0 refills | Status: DC | PRN

## 2018-08-06 MED ORDER — CEPHALEXIN 500 MG PO CAPS: 500.0000 mg | ORAL_CAPSULE | Freq: Four times a day (QID) | ORAL | 0 refills | Status: DC

## 2018-08-06 NOTE — ED Triage Notes (Signed)
Pt states "last night around 8 my heart started racing and it did it for two hours, and at work it did it again today. Pt states she keeps peeing a lot.

## 2018-08-06 NOTE — Discharge Instructions (Addendum)
You have a urinary tract infection which explains the rapid heart beat and the urinary frequency.  Expect symptoms to gradually clear over the next 24 hours.  If symptoms continue, consult your primary care doctor for further evaluation

## 2018-08-06 NOTE — ED Provider Notes (Signed)
[place]    CSN: [id number] Arrival date & time: 08/06/18  1717     History   Chief Complaint Chief Complaint  Patient presents with  . Tachycardia  . Urinary Frequency    HPI ANJELICA GORNIAK is a 38 y.o. female.   Pt states "last night around 8 my heart started racing and it did it for two hours, and at work it did it again today. Pt states she keeps peeing a lot.   Patient is also had a cough since the 10th of the month.  She said initially was getting better but is gotten worse in the last couple days.  She has had no fever or shortness of breath.  She is had no chest pain.     Past Medical History:  Diagnosis Date  . Anemia   . Fibroids   . Gonorrhea     Patient Active Problem List   Diagnosis Date Noted  . DUB (dysfunctional uterine bleeding) 01/12/2015  . Anemia 01/12/2015  . Tibia fracture 08/07/2013  . Encounter for routine gynecological examination 11/29/2012  . Vaginal discharge 02/03/2012  . Iron deficiency anemia 11/01/2011  . Fibroids 11/01/2011  . Lightheadedness 11/01/2011  . ANEMIA 07/08/2009    Past Surgical History:  Procedure Laterality Date  . NO PAST SURGERIES    . TIBIA IM NAIL INSERTION Left 08/07/2013   Procedure: INTRAMEDULLARY (IM) NAIL TIBIAL;  Surgeon: Mauri Pole, MD;  Location: [facility];  Service: Orthopedics;  Laterality: Left;    OB History    Gravida  0   Para  0   Term  0   Preterm  0   AB  0   Living  0     SAB  0   TAB  0   Ectopic  0   Multiple  0   Live Births               Home Medications    Prior to Admission medications   Medication Sig Start Date End Date Taking? Authorizing Provider  benzonatate (TESSALON) 100 MG capsule Take 1-2 capsules (100-200 mg total) by mouth 3 (three) times daily as needed for cough. 08/06/18   Robyn Haber, MD  cephALEXin (KEFLEX) 500 MG capsule Take 1 capsule (500 mg total) by mouth 4 (four) times daily. 08/06/18   Robyn Haber, MD     Family History Family History  Problem Relation Age of Onset  . Hypertension Other     Social History Social History   Tobacco Use  . Smoking status: Never Smoker  . Smokeless tobacco: Never Used  Substance Use Topics  . Alcohol use: No  . Drug use: No     Allergies   Patient has no known allergies.   Review of Systems Review of Systems  Constitutional: Negative.   Respiratory: Positive for cough.   Cardiovascular: Positive for palpitations.  Genitourinary: Positive for frequency. Negative for dysuria.     Physical Exam Triage Vital Signs ED Triage Vitals  Enc Vitals Group     BP 08/06/18 1752 120/79     Pulse Rate 08/06/18 1752 (!) 110     Resp 08/06/18 1752 20     Temp 08/06/18 1752 98.3 F (36.8 C)     Temp src --      SpO2 08/06/18 1752 100 %     Weight --      Height --      Head Circumference --  Peak Flow --      Pain Score 08/06/18 1753 0     Pain Loc --      Pain Edu? --      Excl. in [place]? --    No data found.  Updated Vital Signs BP 120/79   Pulse (!) 110   Temp 98.3 F (36.8 C)   Resp 20   SpO2 100%    Physical Exam Vitals signs and nursing note reviewed.  Constitutional:      Appearance: Normal appearance.  HENT:     Head: Normocephalic and atraumatic.     Nose: Nose normal.     Mouth/Throat:     Mouth: Mucous membranes are moist.     Pharynx: Oropharynx is clear.  Eyes:     Conjunctiva/sclera: Conjunctivae normal.     Pupils: Pupils are equal, round, and reactive to light.  Neck:     Musculoskeletal: Normal range of motion and neck supple.  Cardiovascular:     Rate and Rhythm: Regular rhythm. Tachycardia present.     Heart sounds: Murmur present.     Comments: 2/6 systolic type ejection murmur Pulmonary:     Effort: Pulmonary effort is normal.     Breath sounds: Normal breath sounds.     Comments: Intermittent congested cough Musculoskeletal: Normal range of motion.  Skin:    General: Skin is warm and dry.   Neurological:     General: No focal deficit present.     Mental Status: She is alert.  Psychiatric:        Mood and Affect: Mood normal.        Behavior: Behavior normal.      UC Treatments / Results  Labs (all labs ordered are listed, but only abnormal results are displayed) Labs Reviewed  POCT URINALYSIS DIP (DEVICE) - Abnormal; Notable for the following components:      Result Value   Hgb urine dipstick MODERATE (*)    Protein, ur 30 (*)    Leukocytes,Ua SMALL (*)    All other components within normal limits  URINE CULTURE    EKG None  Radiology No results found.  Procedures Procedures (including critical care time)  Medications Ordered in UC Medications - No data to display  Initial Impression / Assessment and Plan / UC Course  I have reviewed the triage vital signs and the nursing notes.  Pertinent labs & imaging results that were available during my care of the patient were reviewed by me and considered in my medical decision making (see chart for details).    Final Clinical Impressions(s) / UC Diagnoses   Final diagnoses:  Lower urinary tract infectious disease  Sinus tachycardia  Bronchitis     Discharge Instructions     You have a urinary tract infection which explains the rapid heart beat and the urinary frequency.  Expect symptoms to gradually clear over the next 24 hours.  If symptoms continue, consult your primary care doctor for further evaluation    ED Prescriptions    Medication Sig Dispense Auth. Provider   cephALEXin (KEFLEX) 500 MG capsule Take 1 capsule (500 mg total) by mouth 4 (four) times daily. 20 capsule Robyn Haber, MD   benzonatate (TESSALON) 100 MG capsule Take 1-2 capsules (100-200 mg total) by mouth 3 (three) times daily as needed for cough. 40 capsule Robyn Haber, MD     Controlled Substance Prescriptions [place] Controlled Substance Registry consulted? No   Robyn Haber, MD 08/06/18 Tresa Moore

## 2018-08-07 LAB — POCT PREGNANCY, URINE: PREG TEST UR: NEGATIVE

## 2018-08-08 LAB — URINE CULTURE: Culture: >=100000 — AB

## 2018-08-09 ENCOUNTER — Telehealth (HOSPITAL_COMMUNITY): Payer: Self-pay | Admitting: Emergency Medicine

## 2018-08-09 NOTE — Telephone Encounter (Signed)
Urine culture was positive for PROTEUS MIRABILIS and was given  keflex at urgent care visit. Pt contacted and made aware, educated on completing antibiotic and to follow up if symptoms are persistent. Verbalized understanding.    

## 2018-08-13 ENCOUNTER — Telehealth (HOSPITAL_COMMUNITY): Payer: Self-pay | Admitting: Emergency Medicine

## 2018-08-13 MED ORDER — FLUCONAZOLE 150 MG PO TABS: 150.0000 mg | ORAL_TABLET | Freq: Once | ORAL | 0 refills | Status: AC

## 2018-08-13 NOTE — Telephone Encounter (Signed)
Pt called stating the antibiotics gave her a yeast infection. Will send diflucan.

## 2019-01-30 ENCOUNTER — Other Ambulatory Visit: Payer: Self-pay

## 2019-01-30 DIAGNOSIS — Z20822 Contact with and (suspected) exposure to covid-19: Secondary | ICD-10-CM

## 2019-01-31 LAB — NOVEL CORONAVIRUS, NAA: SARS-CoV-2, NAA: NOT DETECTED

## 2019-04-22 ENCOUNTER — Other Ambulatory Visit: Payer: Self-pay

## 2019-04-22 DIAGNOSIS — Z20822 Contact with and (suspected) exposure to covid-19: Secondary | ICD-10-CM

## 2019-04-24 LAB — NOVEL CORONAVIRUS, NAA: SARS-CoV-2, NAA: NOT DETECTED

## 2019-06-25 ENCOUNTER — Encounter: Payer: Self-pay | Admitting: Obstetrics and Gynecology

## 2019-07-18 ENCOUNTER — Encounter: Payer: Self-pay | Admitting: Obstetrics and Gynecology

## 2019-08-19 ENCOUNTER — Encounter: Payer: Self-pay | Admitting: Obstetrics and Gynecology

## 2019-08-19 ENCOUNTER — Ambulatory Visit (INDEPENDENT_AMBULATORY_CARE_PROVIDER_SITE_OTHER): Payer: Self-pay | Admitting: Obstetrics and Gynecology

## 2019-08-19 ENCOUNTER — Other Ambulatory Visit: Payer: Self-pay

## 2019-08-19 VITALS — BP 127/72 | HR 103 | Temp 98.7°F | Ht 71.0 in | Wt 234.0 lb

## 2019-08-19 DIAGNOSIS — Z124 Encounter for screening for malignant neoplasm of cervix: Secondary | ICD-10-CM

## 2019-08-19 DIAGNOSIS — D219 Benign neoplasm of connective and other soft tissue, unspecified: Secondary | ICD-10-CM

## 2019-08-19 DIAGNOSIS — N938 Other specified abnormal uterine and vaginal bleeding: Secondary | ICD-10-CM

## 2019-08-19 DIAGNOSIS — Z1151 Encounter for screening for human papillomavirus (HPV): Secondary | ICD-10-CM

## 2019-08-19 NOTE — Patient Instructions (Signed)
Uterine Fibroids  Uterine fibroids are lumps of tissue (tumors) in your womb (uterus). They are not cancer (are benign). Most women with this condition do not need treatment. Sometimes fibroids can affect your ability to have children (your fertility). If that happens, you may need surgery to take out the fibroids. Follow these instructions at home:  Take over-the-counter and prescription medicines only as told by your doctor. Your doctor may suggest NSAIDs (such as aspirin or ibuprofen) to help with pain.  Ask your doctor if you should: ? Take iron pills. ? Eat more foods that have iron in them, such as dark green, leafy vegetables.  If directed, apply heat to your back or belly to reduce pain. Use the heat source that your doctor recommends, such as a moist heat pack or a heating pad. ? Put a towel between your skin and the heat source. ? Leave the heat on for 20-30 minutes. ? Remove the heat if your skin turns bright red. This is especially important if you are unable to feel pain, heat, or cold. You may have a greater risk of getting burned.  Pay close attention to your period (menstrual) cycles. Tell your doctor about any changes, such as: ? A heavier blood flow than usual. ? Needing to use more pads or tampons than normal. ? A change in how many days your period lasts. ? A change in symptoms that come with your period, such as cramps or back pain.  Keep all follow-up visits as told by your doctor. This is important. Your doctor may need to watch your fibroids over time for any changes. Contact a doctor if you:  Have pain that does not get better with medicine or heat, such as pain or cramps in: ? Your back. ? The area between your hip bones (pelvic area). ? Your belly.  Have new bleeding between your periods.  Have more bleeding during or between your periods.  Feel very tired or weak.  Feel light-headed. Get help right away if you:  Pass out (faint).  Have pain in the  area between your hip bones that suddenly gets worse.  Have bleeding that soaks a tampon or pad in 30 minutes or less. Summary  Uterine fibroids are lumps of tissue (tumors) in your womb (uterus). They are not cancer.  The only treatment that most women need is taking aspirin or ibuprofen for pain.  Contact a doctor if you have pain or cramps that do not get better with medicine.  Make sure you know what symptoms you should get help for right away. This information is not intended to replace advice given to you by your health care provider. Make sure you discuss any questions you have with your health care provider. Document Revised: 05/12/2017 Document Reviewed: 04/25/2017 Elsevier Patient Education  2020 [employer]  

## 2019-08-19 NOTE — Progress Notes (Signed)
Kaitlyn Hays presents with c/o heavy irregular cycles for the last few yrs. Known H/O uterine fibroids. Lst U/S 2009. Will skip 1-2 months with cycle. Cycles have been monthly last few months now Last 7-10 days, 4 days heavy, pads, changes q2 hrs. Accidents and has missed work OCP's in the past helped some Sexual active, no contraception Nulligravid Last pap 2009  PE AF VSS Lungs clear Heart RRR Abd soft + BS enlarged uterus 16-18 weeks GU nl EGBUS, cervix difficult to visualize, pap smear obtained, uterus enlarged 16-18 weeks, unable to eval adnexa d/t to uterine size  A/P DUB        Uterine fibroids/enlarge uterus    Pap smear today. Will check labs and GYN U/S. F/U in 4 weeks to discuss results and management options

## 2019-08-20 LAB — CBC
Hematocrit: 31.5 % — ABNORMAL LOW (ref 34.0–46.6)
Hemoglobin: 8.6 g/dL — ABNORMAL LOW (ref 11.1–15.9)
MCH: 16.8 pg — ABNORMAL LOW (ref 26.6–33.0)
MCHC: 27.3 g/dL — ABNORMAL LOW (ref 31.5–35.7)
MCV: 61 fL — ABNORMAL LOW (ref 79–97)
Platelets: 452 10*3/uL — ABNORMAL HIGH (ref 150–450)
RBC: 5.13 x10E6/uL (ref 3.77–5.28)
RDW: 19 % — ABNORMAL HIGH (ref 11.7–15.4)
WBC: 10.5 10*3/uL (ref 3.4–10.8)

## 2019-08-20 LAB — TSH: TSH: 2.27 u[IU]/mL (ref 0.450–4.500)

## 2019-08-21 ENCOUNTER — Other Ambulatory Visit: Payer: Self-pay | Admitting: Obstetrics and Gynecology

## 2019-08-21 ENCOUNTER — Telehealth: Payer: Self-pay | Admitting: *Deleted

## 2019-08-21 DIAGNOSIS — D500 Iron deficiency anemia secondary to blood loss (chronic): Secondary | ICD-10-CM

## 2019-08-21 MED ORDER — IRON (FERROUS SULFATE) 325 (65 FE) MG PO TABS
1.0000 | ORAL_TABLET | Freq: Three times a day (TID) | ORAL | 0 refills | Status: DC
Start: 2019-08-21 — End: 2020-10-26

## 2019-08-21 NOTE — Telephone Encounter (Signed)
Per message from Dr.Ervin I called Chantale and notified her she is still anemic with hemoglobin 8.6 which has improved and that Dr. Rip Harbour recommends she receive IV Fereheme. She voices understanding and is in agreement with plan, would like it done before 3/18 or on 3/22 or later. Informed her I will schedule and call her back. Also advised per protocol  should be taking iron TID with orange juice if possible and food to increase absorption and decrease gi upset. Also advised may take colace bid if needed for constipation. Also advised repeat cbc in one month per protocol.  Achaia Garlock,RN  10:00 I called and scheduled fereheme for 09/04/19 at 0830 at patient care center at [place]. [facility].  Reilynn Lauro,RN 10:10 I Called and notified Leeza of her appointment. Beverely Suen,RN

## 2019-08-22 ENCOUNTER — Telehealth: Payer: Self-pay | Admitting: Family Medicine

## 2019-08-22 NOTE — Telephone Encounter (Signed)
Patient called in wanting to know why she needed lab work done. Patient was advised that it is to recheck her levels for anemia. Patient verbalized understanding. Patient would also like for a nurse to give her a call back in regards to her options of birth control she has that would stop he periods. Patient advised that I would put a message into the nurses and they will return her call. Patient verbalized understanding. Message sent to clinical pool.

## 2019-08-23 LAB — CYTOLOGY - PAP
Comment: NEGATIVE
Comment: NEGATIVE
Diagnosis: UNDETERMINED — AB
HPV 16: NEGATIVE
HPV 18 / 45: NEGATIVE
High risk HPV: POSITIVE — AB

## 2019-08-26 ENCOUNTER — Encounter: Payer: Self-pay | Admitting: Obstetrics and Gynecology

## 2019-08-26 DIAGNOSIS — R87619 Unspecified abnormal cytological findings in specimens from cervix uteri: Secondary | ICD-10-CM | POA: Insufficient documentation

## 2019-08-26 NOTE — Telephone Encounter (Signed)
3/15  1715  Call returned to pt and discussed her concern. She stated that she is going on vacation next week and did not want to get her period during that time so therefore was wanting to know about some options. Before I could respond to pt's questions, she informed me that her cousin gave her a Nuvaring yesterday and she has already placed it vaginally. She wanted to know if this is ok. I advised that I will send a message to Dr. Rip Harbour and let her know his response. Pt stated that she will remove it and I advised not to do that until I receive his response. Pt was also informed of abnormal Pap and need for Colposcopy - procedure briefly explained. The appointment has been scheduled on 4/5 @ 1455. She will have her CBC drawn on the same Marijayne Rauth. Pt voiced understanding of all information and instructions given.

## 2019-08-28 ENCOUNTER — Telehealth: Payer: Self-pay | Admitting: *Deleted

## 2019-08-28 DIAGNOSIS — B373 Candidiasis of vulva and vagina: Secondary | ICD-10-CM

## 2019-08-28 DIAGNOSIS — B3731 Acute candidiasis of vulva and vagina: Secondary | ICD-10-CM

## 2019-08-28 MED ORDER — FLUCONAZOLE 150 MG PO TABS
150.0000 mg | ORAL_TABLET | Freq: Once | ORAL | 0 refills | Status: AC
Start: 2019-08-28 — End: 2019-08-28

## 2019-08-28 NOTE — Telephone Encounter (Addendum)
Called pt and informed her that Dr. Rip Harbour responded to me that it is ok for her to use the Nuvaring. She should keep the ring in until the Christianjames Soule of her Colpo appt on 4/5 and it will be removed that Maie Kesinger. This is because he does not want her to be on her cycle at the time of the Colpo.  She asked if her Pap showed a yeast infection because she is having vaginal irritation. Per result review, candida was present on the Pap. Pt was advised that I will send in Rx to treat the yeast (fluconazole 150 mg per standing order). She may also use hydrocortisone cream/ointment to external genitalia if desired. Pt voiced understanding of all information and instructions given.

## 2019-09-02 ENCOUNTER — Ambulatory Visit (HOSPITAL_COMMUNITY): Payer: Self-pay

## 2019-09-04 ENCOUNTER — Ambulatory Visit (HOSPITAL_COMMUNITY)
Admission: RE | Admit: 2019-09-04 | Discharge: 2019-09-04 | Disposition: A | Discharge status: Home / Self Care | Payer: Self-pay | Source: Ambulatory Visit | Attending: Internal Medicine | Admitting: Internal Medicine

## 2019-09-04 ENCOUNTER — Other Ambulatory Visit: Payer: Self-pay

## 2019-09-04 ENCOUNTER — Ambulatory Visit (HOSPITAL_COMMUNITY)
Admission: RE | Admit: 2019-09-04 | Discharge: 2019-09-04 | Disposition: A | Discharge status: Home / Self Care | Payer: Self-pay | Source: Ambulatory Visit | Attending: Obstetrics and Gynecology | Admitting: Obstetrics and Gynecology

## 2019-09-04 DIAGNOSIS — D219 Benign neoplasm of connective and other soft tissue, unspecified: Secondary | ICD-10-CM | POA: Insufficient documentation

## 2019-09-04 DIAGNOSIS — N938 Other specified abnormal uterine and vaginal bleeding: Secondary | ICD-10-CM | POA: Insufficient documentation

## 2019-09-04 DIAGNOSIS — D500 Iron deficiency anemia secondary to blood loss (chronic): Secondary | ICD-10-CM | POA: Insufficient documentation

## 2019-09-04 MED ORDER — SODIUM CHLORIDE 0.9 % IV SOLN
510.0000 mg | INTRAVENOUS | Status: DC
  Administered 2019-09-04: 510 mg via INTRAVENOUS
  Filled 2019-09-04: qty 510

## 2019-09-04 MED ORDER — SODIUM CHLORIDE 0.9 % IV SOLN
INTRAVENOUS | Status: DC | PRN
  Administered 2019-09-04: 250 mL via INTRAVENOUS

## 2019-09-04 NOTE — Discharge Instructions (Signed)
Anemia  Anemia is a condition in which you do not have enough red blood cells or hemoglobin. Hemoglobin is a substance in red blood cells that carries oxygen. When you do not have enough red blood cells or hemoglobin (are anemic), your body cannot get enough oxygen and your organs may not work properly. As a result, you may feel very tired or have other problems. What are the causes? Common causes of anemia include:  Excessive bleeding. Anemia can be caused by excessive bleeding inside or outside the body, including bleeding from the intestine or from periods in women.  Poor nutrition.  Long-lasting (chronic) kidney, thyroid, and liver disease.  Bone marrow disorders.  Cancer and treatments for cancer.  HIV (human immunodeficiency virus) and AIDS (acquired immunodeficiency syndrome).  Treatments for HIV and AIDS.  Spleen problems.  Blood disorders.  Infections, medicines, and autoimmune disorders that destroy red blood cells. What are the signs or symptoms? Symptoms of this condition include:  Minor weakness.  Dizziness.  Headache.  Feeling heartbeats that are irregular or faster than normal (palpitations).  Shortness of breath, especially with exercise.  Paleness.  Cold sensitivity.  Indigestion.  Nausea.  Difficulty sleeping.  Difficulty concentrating. Symptoms may occur suddenly or develop slowly. If your anemia is mild, you may not have symptoms. How is this diagnosed? This condition is diagnosed based on:  Blood tests.  Your medical history.  A physical exam.  Bone marrow biopsy. Your health care provider may also check your stool (feces) for blood and may do additional testing to look for the cause of your bleeding. You may also have other tests, including:  Imaging tests, such as a CT scan or MRI.  Endoscopy.  Colonoscopy. How is this treated? Treatment for this condition depends on the cause. If you continue to lose a lot of blood, you may  need to be treated at a hospital. Treatment may include:  Taking supplements of iron, vitamin S31, or folic acid.  Taking a hormone medicine (erythropoietin) that can help to stimulate red blood cell growth.  Having a blood transfusion. This may be needed if you lose a lot of blood.  Making changes to your diet.  Having surgery to remove your spleen. Follow these instructions at home:  Take over-the-counter and prescription medicines only as told by your health care provider.  Take supplements only as told by your health care provider.  Follow any diet instructions that you were given.  Keep all follow-up visits as told by your health care provider. This is important. Contact a health care provider if:  You develop new bleeding anywhere in the body. Get help right away if:  You are very weak.  You are short of breath.  You have pain in your abdomen or chest.  You are dizzy or feel faint.  You have trouble concentrating.  You have bloody or black, tarry stools.  You vomit repeatedly or you vomit up blood. Summary  Anemia is a condition in which you do not have enough red blood cells or enough of a substance in your red blood cells that carries oxygen (hemoglobin).  Symptoms may occur suddenly or develop slowly.  If your anemia is mild, you may not have symptoms.  This condition is diagnosed with blood tests as well as a medical history and physical exam. Other tests may be needed.  Treatment for this condition depends on the cause of the anemia. This information is not intended to replace advice given to you by  your health care provider. Make sure you discuss any questions you have with your health care provider. Document Revised: 05/12/2017 Document Reviewed: 07/01/2016 Elsevier Patient Education  [employer].

## 2019-09-04 NOTE — Progress Notes (Addendum)
[id number] Pt arrived at the [facility] for a feraheme infusion. Pt A&Ox4 on arrival and ambulatory. Pt's VSS, IV started without difficulty. Awaiting meds from pharmacy. WCTM  0935 Fereheme started through IV, WCTM.   0950 Pt sleeping in recliner, tolerating well, WCTM.   1025 Pt discharged home, ambulatory, A&Ox4. Pt tolerated infusion well, no adverse reactions noted, IV removed without difficulty. RN reviewed discharge paperwork with pt, pt understands without assistance. Pt ambulated out of the [facility].

## 2019-09-12 ENCOUNTER — Telehealth: Payer: Self-pay | Admitting: *Deleted

## 2019-09-12 NOTE — Telephone Encounter (Signed)
Jocelyn Lamer left a voice mail she would like her nurse to call her; that it is important.   I called Kaitlyn Hays and she reports she has the nuvaring in and is spotting. States she knows doctor wants her to leave it in until Monday colposcopy and he will take it out.  She wants to know if she should take it out. We discussed since she is just starting the nuvaring it is normal for her to have spotting as her body adjusts to nuvaring and she should leave the nuvaring in.  I explained if she takes it out she may start her period and we would not be able to do the colposcopy.  She also states she is taking the iron TID and is constipated. She asked what can she do. I informed her she can take otc colace BID and metamucil as needed. She voices understanding. Lemuel Boodram,RN

## 2019-09-16 ENCOUNTER — Other Ambulatory Visit (HOSPITAL_COMMUNITY)
Admission: RE | Admit: 2019-09-16 | Discharge: 2019-09-16 | Disposition: A | Discharge status: Home / Self Care | Payer: Self-pay | Source: Ambulatory Visit | Attending: Obstetrics and Gynecology | Admitting: Obstetrics and Gynecology

## 2019-09-16 ENCOUNTER — Ambulatory Visit (INDEPENDENT_AMBULATORY_CARE_PROVIDER_SITE_OTHER): Payer: Self-pay | Admitting: Obstetrics and Gynecology

## 2019-09-16 ENCOUNTER — Encounter: Payer: Self-pay | Admitting: Obstetrics and Gynecology

## 2019-09-16 ENCOUNTER — Other Ambulatory Visit: Payer: Self-pay

## 2019-09-16 VITALS — BP 131/77 | HR 101 | Ht 71.0 in | Wt 236.1 lb

## 2019-09-16 DIAGNOSIS — Z3202 Encounter for pregnancy test, result negative: Secondary | ICD-10-CM

## 2019-09-16 DIAGNOSIS — R87610 Atypical squamous cells of undetermined significance on cytologic smear of cervix (ASC-US): Secondary | ICD-10-CM

## 2019-09-16 DIAGNOSIS — D219 Benign neoplasm of connective and other soft tissue, unspecified: Secondary | ICD-10-CM

## 2019-09-16 DIAGNOSIS — N938 Other specified abnormal uterine and vaginal bleeding: Secondary | ICD-10-CM

## 2019-09-16 MED ORDER — ETONOGESTREL-ETHINYL ESTRADIOL 0.12-0.015 MG/24HR VA RING: VAGINAL_RING | VAGINAL | 12 refills | Status: DC

## 2019-09-16 NOTE — Patient Instructions (Addendum)
Docusate sodium or colace may be used for constipation Colposcopy, Care After This sheet gives you information about how to care for yourself after your procedure. Your doctor may also give you more specific instructions. If you have problems or questions, contact your doctor. What can I expect after the procedure? If you did not have a tissue sample removed (did not have a biopsy), you may only have some spotting for a few days. You can go back to your normal activities. If you had a tissue sample removed, it is common to have:  Soreness and pain. This may last for a few days.  Light-headedness.  Mild bleeding from your vagina or dark-colored, grainy discharge from your vagina. This may last for a few days. You may need to wear a sanitary pad.  Spotting for at least 48 hours after the procedure. Follow these instructions at home:   Take over-the-counter and prescription medicines only as told by your doctor. Ask your doctor what medicines you can start taking again. This is very important if you take blood-thinning medicine.  Do not drive or use heavy machinery while taking prescription pain medicine.  For 3 days, or as long as your doctor tells you, avoid: ? Douching. ? Using tampons. ? Having sex.  If you use birth control (contraception), keep using it.  Limit activity for the first day after the procedure. Ask your doctor what activities are safe for you.  It is up to you to get the results of your procedure. Ask your doctor when your results will be ready.  Keep all follow-up visits as told by your doctor. This is important. Contact a doctor if:  You get a skin rash. Get help right away if:  You are bleeding a lot from your vagina. It is a lot of bleeding if you are using more than one pad an hour for 2 hours in a row.  You have clumps of blood (blood clots) coming from your vagina.  You have a fever.  You have chills  You have pain in your lower belly (pelvic  area).  You have signs of infection, such as vaginal discharge that is: ? Different than usual. ? Yellow. ? Bad-smelling.  You have very pain or cramps in your lower belly that do not get better with medicine.  You feel light-headed.  You feel dizzy.  You pass out (faint). Summary  If you did not have a tissue sample removed (did not have a biopsy), you may only have some spotting for a few days. You can go back to your normal activities.  If you had a tissue sample removed, it is common to have mild pain and spotting for 48 hours.  For 3 days, or as long as your doctor tells you, avoid douching, using tampons and having sex.  Get help right away if you have bleeding, very bad pain, or signs of infection. This information is not intended to replace advice given to you by your health care provider. Make sure you discuss any questions you have with your health care provider. Document Revised: 05/12/2017 Document Reviewed: 02/17/2016 Elsevier Patient Education  [place].  Abdominal Hysterectomy, Care After This sheet gives you information about how to care for yourself after your procedure. Your doctor may also give you more specific instructions. If you have problems or questions, contact your doctor. Follow these instructions at home: Bathing  Do not take baths, swim, or use a hot tub until your doctor says it  is okay. Ask your doctor if you can take showers. You may only be allowed to take sponge baths for bathing.  Keep the bandage (dressing) dry until your doctor says it can be taken off. Surgical cut (incision) care      Follow instructions from your doctor about how to take care of your cut from surgery. Make sure you: ? Wash your hands with soap and water before you change your bandage (dressing). If you cannot use soap and water, use hand sanitizer. ? Change your bandage as told by your doctor. ? Leave stitches (sutures), skin glue, or skin tape (adhesive)  strips in place. They may need to stay in place for 2 weeks or longer. If tape strips get loose and curl up, you may trim the loose edges. Do not remove tape strips completely unless your doctor says it is okay.  Check your surgical cut area every day for signs of infection. Check for: ? Redness, swelling, or pain. ? Fluid or blood. ? Warmth. ? Pus or a bad smell. Activity  Do gentle, daily exercise as told by your doctor. You may be told to take short walks every day and go farther each time.  Do not lift anything that is heavier than 10 lb (4.5 kg), or the limit that your doctor tells you, until he or she says that it is safe.  Do not drive or use heavy machinery while taking prescription pain medicine.  Do not drive for 24 hours if you were given a medicine to help you relax (sedative).  Follow your doctor's advice about exercise, driving, and general activities. Ask your doctor what activities are safe for you. Lifestyle   Do not douche, use tampons, or have sex for at least 6 weeks or as told by your doctor.  Do not drink alcohol until your doctor says it is okay.  Drink enough fluid to keep your pee (urine) clear or pale yellow.  Try to have someone at home with you for the first 1-2 weeks to help.  Do not use any products that contain nicotine or tobacco, such as cigarettes and e-cigarettes. These can slow down healing. If you need help quitting, ask your doctor. General instructions  Take over-the-counter and prescription medicines only as told by your doctor.  Do not take aspirin or ibuprofen. These medicines can cause bleeding.  To prevent or treat constipation while you are taking prescription pain medicine, your doctor may suggest that you: ? Drink enough fluid to keep your urine clear or pale yellow. ? Take over-the-counter or prescription medicines. ? Eat foods that are high in fiber, such as:  Fresh fruits and vegetables.  Whole grains.  Beans. ? Limit  foods that are high in fat and processed sugars, such as fried and sweet foods.  Keep all follow-up visits as told by your doctor. This is important. Contact a doctor if:  You have chills or fever.  You have redness, swelling, or pain around your cut.  You have fluid or blood coming from your cut.  Your cut feels warm to the touch.  You have pus or a bad smell coming from your cut.  Your cut breaks open.  You feel dizzy or light-headed.  You have pain or bleeding when you pee.  You keep having watery poop (diarrhea).  You keep feeling sick to your stomach (nauseous) or keep throwing up (vomiting).  You have unusual fluid (discharge) coming from your vagina.  You have a rash.  You have a reaction to your medicine.  Your pain medicine does not help. Get help right away if:  You have a fever and your symptoms get worse all of a sudden.  You have very bad belly (abdominal) pain.  You are short of breath.  You pass out (faint).  You have pain, swelling, or redness of your leg.  You bleed a lot from your vagina and notice clumps of blood (clots). Summary  Do not take baths, swim, or use a hot tub until your doctor says it is okay. Ask your doctor if you can take showers. You may only be allowed to take sponge baths for bathing.  Follow your doctor's advice about exercise, driving, and general activities. Ask your doctor what activities are safe for you.  Do not lift anything that is heavier than 10 lb (4.5 kg), or the limit that your doctor tells you, until he or she says that it is safe.  Try to have someone at home with you for the first 1-2 weeks to help. This information is not intended to replace advice given to you by your health care provider. Make sure you discuss any questions you have with your health care provider. Document Revised: 08/02/2018 Document Reviewed: 05/18/2016 Elsevier Patient Education  [place].  Hysterectomy Information  A  hysterectomy is a surgery to remove your uterus. After surgery, you will no longer have periods. Also, you will no longer be able to get pregnant. Reasons for this surgery You may have this surgery if:  You have bleeding in your vagina: ? That is not normal. ? That does not stop, or that keeps coming back.  You have long-term (chronic) pain in your lower belly (pelvic area).  The lining of your uterus grows outside of the uterus (endometriosis).  The lining of your uterus grows in the muscle of the uterus (adenomyosis).  Your uterus falls down into your vagina (prolapse).  You have a growth in your uterus that causes problems (uterine fibroids).  You have cells that could turn into cancer (precancerous cells).  You have cancer of the uterus or cervix. Types of hysterectomies There are 3 types of hysterectomies. Depending on the type, the surgery will:  Remove the top part of the uterus (supracervical).  Remove the uterus and the cervix (total).  Remove the uterus, cervix, and tissue that holds the uterus in place (radical). Ways a hysterectomy can be done This surgery may be done in one of these ways:  A cut (incision) is made in the belly (abdomen). The uterus is taken out through the cut.  A cut is made in the vagina. The uterus is taken out through the cut.  Three or four cuts are made in the belly. A device with a camera is put through one of the cuts. The uterus is cut into pieces and taken out through the cuts or the vagina.  Three or four cuts are made in the belly. A device with a camera is put through one of the cuts. The uterus is taken out through the vagina.  Three or four cuts are made in the belly. A computer helps control the surgical tools. The uterus is cut into small pieces. The pieces are taken out through the cuts or through the vagina. Talk with your doctor about which way is best for you. Risks of hysterectomy Generally, this surgery is safe. However,  problems can happen, including:  Bleeding.  Needing donated blood (transfusion).  Blood  clots.  Infection.  Damage to other structures or organs.  Allergic reactions.  Needing to switch to a different type of surgery. What to expect after surgery  You will be given pain medicine.  You will need to stay in the hospital for 1-2 days.  Follow your doctor's instructions about: ? Exercising. ? Driving. ? What activities are safe for you.  You will need to have someone with you at home for 3-5 days.  You will need to see your doctor after 2-4 weeks.  You may get hot flashes, have night sweats, and have trouble sleeping.  You may need to have Pap tests if your surgery was related to cancer. Talk with your doctor about how often you need Pap tests. Questions to ask your doctor  Do I need this surgery? Do I have other treatment options?  What are my options for this surgery?  What needs to be removed?  What are the risks?  What are the benefits?  How long will I need to stay in the hospital?  How long will I need to recover?  What symptoms can I expect after the procedure? Summary  A hysterectomy is a surgery to remove your uterus. After surgery, you will no longer have periods. Also, you will no longer be able to get pregnant.  Talk with your doctor about which type of hysterectomy is best for you. This information is not intended to replace advice given to you by your health care provider. Make sure you discuss any questions you have with your health care provider. Document Revised: 08/02/2018 Document Reviewed: 08/30/2016 Elsevier Patient Education  [place].

## 2019-09-16 NOTE — Progress Notes (Signed)
[name], female   DOB: Mar 18, 1981, 39 y.o.   MRN: [id number]    GYNECOLOGY CLINIC COLPOSCOPY PROCEDURE NOTE  39 y.o. [id number] here for colposcopy for ASCUS with NEGATIVE high risk HPV pap smear on 3/21. Discussed role for HPV in cervical dysplasia, need for surveillance.  Patient given informed consent, signed copy in the chart, time out was performed.  Placed in lithotomy position. Cervix viewed with speculum and colposcope after application of acetic acid.   Colposcopy adequate? Yes  But cervix was difficult to see d/t deviated to the left and posterior fibroid  no visible lesions; random BX obtained.  ECC specimen obtained. All specimens were labelled and sent to pathology.   Patient was given post procedure instructions.  Will follow up pathology and manage accordingly.  Routine preventative health maintenance measures emphasized.  Abnormal pap smear Uterine fibroids DUB  Pt has placed a Nuvaring since last visit. Cycle has improved. U/S demonstrated several uterine fibroids largest 8 x 6 x 7. U/S findings reviewed with pt. Pt desires definite therapy. Discussed that surgery would eliminate potential for pregnancy. Pt verbalized understanding of this and still desires definite therapy. TAH/BS reviewed with pt. R/B/Post care reviewed. Information provided as well. Will schedule once colpo Bx results return. Will continue with Nuvaring until surgery. F/U with post op appt. Financial assistance information provided to pt today as well. Hysterectomy papers signed today   Arlina Robes, MD, [facility] [place]

## 2019-09-16 NOTE — Progress Notes (Signed)
C/o pelvic pain =6,not sure if it is from constipation or Nuvaring.

## 2019-09-17 LAB — POCT PREGNANCY, URINE: Preg Test, Ur: NEGATIVE

## 2019-09-18 LAB — SURGICAL PATHOLOGY

## 2019-09-19 ENCOUNTER — Encounter: Payer: Self-pay | Admitting: *Deleted

## 2019-09-20 ENCOUNTER — Telehealth: Payer: Self-pay | Admitting: General Practice

## 2019-09-20 LAB — POCT PREGNANCY, URINE: Preg Test, Ur: NEGATIVE

## 2019-09-20 NOTE — Telephone Encounter (Signed)
Please let pt know that I will need to see her face to face to discuss surgery again, since she has questions about future ability for pregnancy. Will need to do this before I schedule surgery.  Thanks Legrand Como

## 2019-09-20 NOTE — Telephone Encounter (Signed)
Called patient and informed her of results/recommendation. Patient verbalized understanding and asked if there were alternatives to a hysterectomy because she may want to become pregnant. Told patient there are alternatives and if she is uncertain about the surgery then we should bring her back to discuss before scheduling. Patient verbalized understanding and states she would like to go ahead and schedule and would also like another appt with Dr Rip Harbour as well. Told patient I will let him know. Discussed she will receive a letter in the mail with a surgery date and someone from our front office will call with a follow up appt to see him. Patient verbalized understanding.

## 2019-09-20 NOTE — Telephone Encounter (Signed)
-----   Message from [name], MD sent at 09/19/2019 11:18 AM EDT ----- Please let Ms Kaitlyn Hays know that her colposcopy biopsies revealed mild dysplasia only. We can proceed with scheduling her surgery if she desires. Just let me know.  Thanks Legrand Como

## 2019-10-01 ENCOUNTER — Telehealth: Payer: Self-pay | Admitting: *Deleted

## 2019-10-01 NOTE — Telephone Encounter (Signed)
Pt left VM message and requested a call back ASAP. She did not state her question or problem.

## 2019-10-02 ENCOUNTER — Encounter: Payer: Self-pay | Admitting: *Deleted

## 2019-10-03 NOTE — Telephone Encounter (Signed)
Kaitlyn Hays left a message she has called several times and needs a nurse to call her asap about problems with her birth control. Aletha Allebach,RN

## 2019-10-07 NOTE — Telephone Encounter (Signed)
Pt left a VM message stating that she has called the office 3 times and would like a call back. I returned the call to pt and discussed her concern. She stated that after having the colposcopy on 4/5, she was having some vaginal spotting. She changed her Nuvaring on 4/8 and ever since that Deundra Furber has been having heavy vaginal bleeding. She has been changing her pad every 2 hours and having small clots (size of pencil eraser or almond). Pt also states that she feels weak and tired. She denies SOB or dizziness. Pt feels that the Nuvaring is causing the problem. Per consult with Dr. Nehemiah Settle, pt was recommended to have appt in office. she was offered appointment for tomorrow @ [phone] to see Dr. Rip Harbour and accepted.

## 2019-10-08 ENCOUNTER — Encounter: Payer: Self-pay | Admitting: Obstetrics and Gynecology

## 2019-10-08 ENCOUNTER — Other Ambulatory Visit: Payer: Self-pay

## 2019-10-08 ENCOUNTER — Ambulatory Visit (INDEPENDENT_AMBULATORY_CARE_PROVIDER_SITE_OTHER): Payer: Self-pay | Admitting: Obstetrics and Gynecology

## 2019-10-08 VITALS — BP 125/78 | HR 98 | Wt 228.6 lb

## 2019-10-08 DIAGNOSIS — N938 Other specified abnormal uterine and vaginal bleeding: Secondary | ICD-10-CM

## 2019-10-08 DIAGNOSIS — D500 Iron deficiency anemia secondary to blood loss (chronic): Secondary | ICD-10-CM

## 2019-10-08 DIAGNOSIS — D219 Benign neoplasm of connective and other soft tissue, unspecified: Secondary | ICD-10-CM

## 2019-10-08 LAB — CBC
Hematocrit: 36.3 % (ref 34.0–46.6)
Hemoglobin: 10.6 g/dL — ABNORMAL LOW (ref 11.1–15.9)
MCH: 20.9 pg — ABNORMAL LOW (ref 26.6–33.0)
MCHC: 29.2 g/dL — ABNORMAL LOW (ref 31.5–35.7)
MCV: 72 fL — ABNORMAL LOW (ref 79–97)
Platelets: 428 10*3/uL (ref 150–450)
RBC: 5.08 x10E6/uL (ref 3.77–5.28)
WBC: 7.9 10*3/uL (ref 3.4–10.8)

## 2019-10-08 MED ORDER — MEGESTROL ACETATE 40 MG PO TABS: ORAL_TABLET | ORAL | 3 refills | Status: DC

## 2019-10-08 NOTE — Progress Notes (Signed)
Ms Hettinger presents today to discuss surgery. See prior office notes. She is also having bleeding problems again. She is on her second month of NuvaRing. Since placement of her second NuvaRing she has had some degree of bleeding daily.   PE  AF VSS Lungs clear Heart RRR Abd soft + BS enlarged uterus @ 18 weeks GU deferred  A/P DUB        Uterine fibroids        IDA  Discussed TAH/BS with pt. R/B/Post op care reviewed. Pt made aware again inability for future pregnancy. Pt voiced understanding and desires to proceed. Management options for DUB reviewed with pt. Will discontinue the NuvaRing and begin Megace high dose taper. Will check CBC as well.

## 2019-10-08 NOTE — Patient Instructions (Signed)
Vaginal Hysterectomy, Care After Refer to this sheet in the next few weeks. These instructions provide you with information about caring for yourself after your procedure. Your health care provider may also give you more specific instructions. Your treatment has been planned according to current medical practices, but problems sometimes occur. Call your health care provider if you have any problems or questions after your procedure. What can I expect after the procedure? After the procedure, it is common to have:  Pain.  Soreness and numbness in your incision areas.  Vaginal bleeding and discharge.  Constipation.  Temporary problems emptying the bladder.  Feelings of sadness or other emotions. Follow these instructions at home: Medicines  Take over-the-counter and prescription medicines only as told by your health care provider.  If you were prescribed an antibiotic medicine, take it as told by your health care provider. Do not stop taking the antibiotic even if you start to feel better.  Do not drive or operate heavy machinery while taking prescription pain medicine. Activity  Return to your normal activities as told by your health care provider. Ask your health care provider what activities are safe for you.  Get regular exercise as told by your health care provider. You may be told to take short walks every day and go farther each time.  Do not lift anything that is heavier than 10 lb (4.5 kg). General instructions   Do not put anything in your vagina for 6 weeks after your surgery or as told by your health care provider. This includes tampons and douches.  Do not have sex until your health care provider says you can.  Do not take baths, swim, or use a hot tub until your health care provider approves.  Drink enough fluid to keep your urine clear or pale yellow.  Do not drive for 24 hours if you were given a sedative.  Keep all follow-up visits as told by your health  care provider. This is important. Contact a health care provider if:  Your pain medicine is not helping.  You have a fever.  You have redness, swelling, or pain at your incision site.  You have blood, pus, or a bad-smelling discharge from your vagina.  You continue to have difficulty urinating. Get help right away if:  You have severe abdominal or back pain.  You have heavy bleeding from your vagina.  You have chest pain or shortness of breath. This information is not intended to replace advice given to you by your health care provider. Make sure you discuss any questions you have with your health care provider. Document Revised: 01/21/2016 Document Reviewed: 06/14/2015 Elsevier Patient Education  2020 [place]. Vaginal Hysterectomy  A vaginal hysterectomy is a procedure to remove all or part of the uterus through a small incision in the vagina. In this procedure, your health care provider may remove your entire uterus, including the lower end (cervix). You may need a vaginal hysterectomy to treat:  Uterine fibroids.  A condition that causes the lining of the uterus to grow in other areas (endometriosis).  Problems with pelvic support.  Cancer of the cervix, ovaries, uterus, or tissue that lines the uterus (endometrium).  Excessive (dysfunctional) uterine bleeding. When removing your uterus, your health care provider may also remove the organs that produce eggs (ovaries) and the tubes that carry eggs to your uterus (fallopian tubes). After a vaginal hysterectomy, you will no longer be able to have a baby. You will also no longer get your  menstrual period. Tell a health care provider about:  Any allergies you have.  All medicines you are taking, including vitamins, herbs, eye drops, creams, and over-the-counter medicines.  Any problems you or family members have had with anesthetic medicines.  Any blood disorders you have.  Any surgeries you have had.  Any medical  conditions you have.  Whether you are pregnant or may be pregnant. What are the risks? Generally, this is a safe procedure. However, problems may occur, including:  Bleeding.  Infection.  A blood clot that forms in your leg and travels to your lungs (pulmonary embolism).  Damage to surrounding organs.  Pain during sex. What happens before the procedure?  Ask your health care provider what organs will be removed during surgery.  Ask your health care provider about: ? Changing or stopping your regular medicines. This is especially important if you are taking diabetes medicines or blood thinners. ? Taking medicines such as aspirin and ibuprofen. These medicines can thin your blood. Do not take these medicines before your procedure if your health care provider instructs you not to.  Follow instructions from your health care provider about eating or drinking restrictions.  Do not use any tobacco products, such as cigarettes, chewing tobacco, and e-cigarettes. If you need help quitting, ask your health care provider.  Plan to have someone take you home after discharge from the hospital. What happens during the procedure?  To reduce your risk of infection: ? Your health care team will wash or sanitize their hands. ? Your skin will be washed with soap.  An IV tube will be inserted into one of your veins.  You may be given antibiotic medicine to help prevent infection.  You will be given one or more of the following: ? A medicine to help you relax (sedative). ? A medicine to numb the area (local anesthetic). ? A medicine to make you fall asleep (general anesthetic). ? A medicine that is injected into an area of your body to numb everything beyond the injection site (regional anesthetic).  Your surgeon will make an incision in your vagina.  Your surgeon will locate and remove all or part of your uterus.  Your ovaries and fallopian tubes may be removed at the same time.  The  incision will be closed with stitches (sutures) that dissolve over time. The procedure may vary among health care providers and hospitals. What happens after the procedure?  Your blood pressure, heart rate, breathing rate, and blood oxygen level will be monitored often until the medicines you were given have worn off.  You will be encouraged to get up and walk around after a few hours to help prevent complications.  You may have IV tubes in place for a few days.  You will be given pain medicine as needed.  Do not drive for 24 hours if you were given a sedative. This information is not intended to replace advice given to you by your health care provider. Make sure you discuss any questions you have with your health care provider. Document Revised: 01/21/2016 Document Reviewed: 06/14/2015 Elsevier Patient Education  2020 [employer].

## 2019-10-09 ENCOUNTER — Telehealth: Payer: Self-pay

## 2019-10-09 NOTE — Telephone Encounter (Signed)
Called to find out when she will be able to come in to sign her hysterectomy statement.  Pt stated that she will be able to come on Monday to new location to sign statement.  Pt stated that she did not have a pen or paper to write location info.  I requested to call her back and leave information on her voicemail.  Pt agreed.  New location information left on pt's voicemail.    Mel Almond, RN

## 2019-10-14 ENCOUNTER — Telehealth: Payer: Self-pay

## 2019-10-14 NOTE — Telephone Encounter (Addendum)
-----   Message from [name], MD sent at 10/11/2019 10:04 AM EDT ----- Please let pt know that her Hgb was only slightly low at 10.6. I recommend OTC iron supplement daily until her surgery.  Thanks Legrand Como   L/M on voicemail stating that I am calling to inform her that the provider has sent in an iron supplement to her [facility] on [address]. If she has any questions to please give the office a call back.    Mel Almond, RN

## 2019-10-22 ENCOUNTER — Telehealth: Payer: Self-pay | Admitting: *Deleted

## 2019-10-22 NOTE — Telephone Encounter (Signed)
Notified by George Hugh next surgery date for Dr.Ervin is 12/31/19. I called [place] and notifed her ; she states that date is fine. I informed her I will notify scheduler and we still want her to come by tomorrow and sign hysterectomy statement. Alina Gilkey,RN

## 2019-10-22 NOTE — Telephone Encounter (Signed)
I called Kaitlyn Hays to notify her she needs to sign another Hysterectomy consent because there can not be any abbreviations on the consent . She states she can come by tomorrow to sign. She also asked if her surgery can be reschedule to after 6/3 wedding. She states she thought she told the doctor about the wedding. I informed her I will pass along the information. Jacques Navy

## 2019-10-28 ENCOUNTER — Ambulatory Visit: Payer: Self-pay | Admitting: Obstetrics and Gynecology

## 2019-11-04 ENCOUNTER — Ambulatory Visit (HOSPITAL_COMMUNITY)
Admission: EM | Admit: 2019-11-04 | Discharge: 2019-11-04 | Disposition: A | Discharge status: Home / Self Care | Payer: HRSA Program | Attending: Family Medicine | Admitting: Family Medicine

## 2019-11-04 ENCOUNTER — Telehealth: Payer: Self-pay | Admitting: *Deleted

## 2019-11-04 ENCOUNTER — Other Ambulatory Visit: Payer: Self-pay

## 2019-11-04 ENCOUNTER — Encounter (HOSPITAL_COMMUNITY): Payer: Self-pay | Admitting: Emergency Medicine

## 2019-11-04 DIAGNOSIS — Z79899 Other long term (current) drug therapy: Secondary | ICD-10-CM | POA: Diagnosis not present

## 2019-11-04 DIAGNOSIS — Z7952 Long term (current) use of systemic steroids: Secondary | ICD-10-CM | POA: Diagnosis not present

## 2019-11-04 DIAGNOSIS — J40 Bronchitis, not specified as acute or chronic: Secondary | ICD-10-CM | POA: Insufficient documentation

## 2019-11-04 DIAGNOSIS — Z20822 Contact with and (suspected) exposure to covid-19: Secondary | ICD-10-CM | POA: Insufficient documentation

## 2019-11-04 MED ORDER — HYDROCODONE-HOMATROPINE 5-1.5 MG/5ML PO SYRP: 5.0000 mL | ORAL_SOLUTION | Freq: Every evening | ORAL | 0 refills | Status: DC | PRN

## 2019-11-04 MED ORDER — ALBUTEROL SULFATE HFA 108 (90 BASE) MCG/ACT IN AERS: 1.0000 | INHALATION_SPRAY | Freq: Four times a day (QID) | RESPIRATORY_TRACT | 0 refills | Status: DC | PRN

## 2019-11-04 MED ORDER — BENZONATATE 200 MG PO CAPS: 200.0000 mg | ORAL_CAPSULE | Freq: Three times a day (TID) | ORAL | 0 refills | Status: AC | PRN

## 2019-11-04 MED ORDER — DOXYCYCLINE HYCLATE 100 MG PO CAPS: 100.0000 mg | ORAL_CAPSULE | Freq: Two times a day (BID) | ORAL | 0 refills | Status: AC

## 2019-11-04 MED ORDER — PREDNISONE 50 MG PO TABS: 50.0000 mg | ORAL_TABLET | Freq: Every day | ORAL | 0 refills | Status: AC

## 2019-11-04 NOTE — ED Provider Notes (Signed)
[place]    CSN: [id number] Arrival date & time: 11/04/19  1222      History   Chief Complaint Chief Complaint  Patient presents with  . Cough    HPI Kaitlyn Hays is a 39 y.o. female history of fibroids presenting today for evaluation of a cough.  Patient reports that she has had a cough for the past month.  Cough has been productive and has had a lot of mucus production.  At times she has felt slightly short of breath.  She has had congestion sneezing and rhinorrhea as well.  Denies fevers chills or body aches.  Using Benadryl and other over-the-counter medicines without relief.  Denies any known sick exposures.  Is not vaccinated for Covid.  HPI  Past Medical History:  Diagnosis Date  . Anemia   . Fibroids   . Gonorrhea   . Tibia fracture 08/07/2013    Patient Active Problem List   Diagnosis Date Noted  . Abnormal Pap smear of cervix 08/26/2019  . DUB (dysfunctional uterine bleeding) 01/12/2015  . Encounter for routine gynecological examination 11/29/2012  . Iron deficiency anemia 11/01/2011  . Fibroids 11/01/2011  . ANEMIA 07/08/2009    Past Surgical History:  Procedure Laterality Date  . NO PAST SURGERIES    . TIBIA IM NAIL INSERTION Left 08/07/2013   Procedure: INTRAMEDULLARY (IM) NAIL TIBIAL;  Surgeon: Mauri Pole, MD;  Location: [facility];  Service: Orthopedics;  Laterality: Left;    OB History    Gravida  0   Para  0   Term  0   Preterm  0   AB  0   Living  0     SAB  0   TAB  0   Ectopic  0   Multiple  0   Live Births               Home Medications    Prior to Admission medications   Medication Sig Start Date End Date Taking? Authorizing Provider  megestrol (MEGACE) 40 MG tablet Take two tablets three times daily for 3 days, then two tablets twice daily for 3 days, then one tablet twice daily 10/08/19  Yes [name], MD  albuterol (VENTOLIN HFA) 108 (90 Base) MCG/ACT inhaler Inhale 1-2 puffs into the lungs  every 6 (six) hours as needed for wheezing or shortness of breath. 11/04/19   Saysha Menta C, PA-C  benzonatate (TESSALON) 200 MG capsule Take 1 capsule (200 mg total) by mouth 3 (three) times daily as needed for up to 7 days for cough (daytime). 11/04/19 11/11/19  Alzina Golda C, PA-C  doxycycline (VIBRAMYCIN) 100 MG capsule Take 1 capsule (100 mg total) by mouth 2 (two) times daily for 10 days. 11/04/19 11/14/19  Thadius Smisek C, PA-C  HYDROcodone-homatropine (HYCODAN) 5-1.5 MG/5ML syrup Take 5 mLs by mouth at bedtime as needed for cough. 11/04/19   Nahdia Doucet C, PA-C  Iron, Ferrous Sulfate, 325 (65 Fe) MG TABS Take 1 tablet by mouth 3 (three) times daily with meals. 08/21/19   [name], MD  predniSONE (DELTASONE) 50 MG tablet Take 1 tablet (50 mg total) by mouth daily for 5 days. 11/04/19 11/09/19  Mirenda Baltazar, Elesa Hacker, PA-C    Family History Family History  Problem Relation Age of Onset  . Hypertension Other     Social History Social History   Tobacco Use  . Smoking status: Never Smoker  . Smokeless tobacco: Never Used  Substance Use Topics  . Alcohol use: No  . Drug use: No     Allergies   Patient has no known allergies.   Review of Systems Review of Systems  Constitutional: Positive for fatigue. Negative for activity change, appetite change, chills and fever.  HENT: Positive for congestion, rhinorrhea and sinus pressure. Negative for ear pain, sore throat and trouble swallowing.   Eyes: Negative for discharge and redness.  Respiratory: Positive for cough and shortness of breath. Negative for chest tightness.   Cardiovascular: Negative for chest pain.  Gastrointestinal: Negative for abdominal pain, diarrhea, nausea and vomiting.  Musculoskeletal: Negative for myalgias.  Skin: Negative for rash.  Neurological: Negative for dizziness, light-headedness and headaches.     Physical Exam Triage Vital Signs ED Triage Vitals  Enc Vitals Group     BP 11/04/19 1418  128/87     Pulse Rate 11/04/19 1418 100     Resp 11/04/19 1418 14     Temp 11/04/19 1418 98.4 F (36.9 C)     Temp Source 11/04/19 1418 Oral     SpO2 11/04/19 1418 100 %     Weight --      Height --      Head Circumference --      Peak Flow --      Pain Score 11/04/19 1445 0     Pain Loc --      Pain Edu? --      Excl. in [place]? --    No data found.  Updated Vital Signs BP 128/87 (BP Location: Left Arm)   Pulse 100   Temp 98.4 F (36.9 C) (Oral)   Resp 14   SpO2 100%   Visual Acuity Right Eye Distance:   Left Eye Distance:   Bilateral Distance:    Right Eye Near:   Left Eye Near:    Bilateral Near:     Physical Exam Vitals and nursing note reviewed.  Constitutional:      General: She is not in acute distress.    Appearance: She is well-developed.  HENT:     Head: Normocephalic and atraumatic.     Ears:     Comments: Bilateral ears without tenderness to palpation of external auricle, tragus and mastoid, EAC's without erythema or swelling, TM's with good bony landmarks and cone of light. Non erythematous.     Mouth/Throat:     Comments: Oral mucosa pink and moist, no tonsillar enlargement or exudate. Posterior pharynx patent and nonerythematous, no uvula deviation or swelling. Normal phonation.  Eyes:     Conjunctiva/sclera: Conjunctivae normal.  Cardiovascular:     Rate and Rhythm: Normal rate and regular rhythm.     Heart sounds: No murmur.  Pulmonary:     Effort: Pulmonary effort is normal. No respiratory distress.     Breath sounds: Wheezing present.     Comments: Coarse breath sounds and end expiratory wheezing noted in bilateral lung fields Abdominal:     Palpations: Abdomen is soft.     Tenderness: There is no abdominal tenderness.  Musculoskeletal:     Cervical back: Neck supple.  Skin:    General: Skin is warm and dry.  Neurological:     Mental Status: She is alert.      UC Treatments / Results  Labs (all labs ordered are listed, but only  abnormal results are displayed) Labs Reviewed  SARS CORONAVIRUS 2 (TAT 6-24 HRS)    EKG   Radiology No results found.  Procedures  Procedures (including critical care time)  Medications Ordered in UC Medications - No data to display  Initial Impression / Assessment and Plan / UC Course  I have reviewed the triage vital signs and the nursing notes.  Pertinent labs & imaging results that were available during my care of the patient were reviewed by me and considered in my medical decision making (see chart for details).     Covid test pending, exam consistent with bronchitis.  Given length of symptoms we will go ahead and cover with antibiotics and initiate on doxycycline.  Prednisone and albuterol for chest inflammation/wheezing.  Tessalon for daytime cough, Hycodan for nighttime cough.  Rest and fluids.  Discussed strict return precautions. Patient verbalized understanding and is agreeable with plan.  Final Clinical Impressions(s) / UC Diagnoses   Final diagnoses:  Bronchitis     Discharge Instructions     Covid test pending, monitor my chart for results I am treating you for bronchitis Begin doxycycline twice daily for 1 week Prednisone daily in the morning to help with inflammation in lungs-take with food May use Tessalon/benzonatate as needed for daytime cough Hycodan for cough at bedtime-will cause drowsiness-do not drive or work after taking Albuterol inhaler as needed for shortness of breath, chest tightness and wheezing  Please follow-up if any symptoms not improving or worsening    ED Prescriptions    Medication Sig Dispense Auth. Provider   albuterol (VENTOLIN HFA) 108 (90 Base) MCG/ACT inhaler Inhale 1-2 puffs into the lungs every 6 (six) hours as needed for wheezing or shortness of breath. 8 g Mertha Clyatt C, PA-C   predniSONE (DELTASONE) 50 MG tablet Take 1 tablet (50 mg total) by mouth daily for 5 days. 5 tablet Kaitrin Seybold C, PA-C   doxycycline  (VIBRAMYCIN) 100 MG capsule Take 1 capsule (100 mg total) by mouth 2 (two) times daily for 10 days. 20 capsule Amor Packard C, PA-C   benzonatate (TESSALON) 200 MG capsule Take 1 capsule (200 mg total) by mouth 3 (three) times daily as needed for up to 7 days for cough (daytime). 28 capsule Micahel Omlor C, PA-C   HYDROcodone-homatropine (HYCODAN) 5-1.5 MG/5ML syrup Take 5 mLs by mouth at bedtime as needed for cough. 75 mL Shaquille Murdy, [place] C, PA-C     I have reviewed the PDMP during this encounter.   Janith Lima, [place] 11/04/19 1534

## 2019-11-04 NOTE — Discharge Instructions (Signed)
Covid test pending, monitor my chart for results I am treating you for bronchitis Begin doxycycline twice daily for 1 week Prednisone daily in the morning to help with inflammation in lungs-take with food May use Tessalon/benzonatate as needed for daytime cough Hycodan for cough at bedtime-will cause drowsiness-do not drive or work after taking Albuterol inhaler as needed for shortness of breath, chest tightness and wheezing  Please follow-up if any symptoms not improving or worsening

## 2019-11-04 NOTE — ED Triage Notes (Signed)
Pt c/o productive cough with white phlegm. Pt states she has a lot og chest congestion. Denies fever, chills, body aches.  Pt is taking Benadryl and a variety of OTC antihistamines with no relief. Pt has not been vaccinated.

## 2019-11-04 NOTE — Telephone Encounter (Signed)
I called  Saundra and left a message I am calling because we still need her to come by and sign the form we discussed asap during office hours. Jacques Navy

## 2019-11-05 LAB — SARS CORONAVIRUS 2 (TAT 6-24 HRS): SARS Coronavirus 2: NEGATIVE

## 2019-11-06 ENCOUNTER — Telehealth (HOSPITAL_COMMUNITY): Payer: Self-pay | Admitting: Urgent Care

## 2019-11-06 NOTE — Telephone Encounter (Signed)
Called for her COVID results, we advised her they were negative. Patient verbalized understanding.

## 2019-11-28 ENCOUNTER — Telehealth: Payer: Self-pay | Admitting: *Deleted

## 2019-11-28 NOTE — Telephone Encounter (Signed)
Kaitlyn Hays still has not signed her Hysterectomy statement yet. I called her again and reminded her we need her to sign asap. She apologized and states she has been sick with bronchitis; but she will come by soon. Charels Stambaugh,RN

## 2019-12-12 ENCOUNTER — Ambulatory Visit (HOSPITAL_COMMUNITY)
Admission: EM | Admit: 2019-12-12 | Discharge: 2019-12-12 | Disposition: A | Discharge status: Home / Self Care | Payer: Self-pay | Attending: Family Medicine | Admitting: Family Medicine

## 2019-12-12 ENCOUNTER — Encounter (HOSPITAL_COMMUNITY): Payer: Self-pay

## 2019-12-12 ENCOUNTER — Other Ambulatory Visit: Payer: Self-pay

## 2019-12-12 DIAGNOSIS — R059 Cough, unspecified: Secondary | ICD-10-CM

## 2019-12-12 DIAGNOSIS — J0111 Acute recurrent frontal sinusitis: Secondary | ICD-10-CM

## 2019-12-12 DIAGNOSIS — R05 Cough: Secondary | ICD-10-CM

## 2019-12-12 DIAGNOSIS — J209 Acute bronchitis, unspecified: Secondary | ICD-10-CM

## 2019-12-12 MED ORDER — FLUCONAZOLE 150 MG PO TABS: ORAL_TABLET | ORAL | 0 refills | Status: DC

## 2019-12-12 MED ORDER — PREDNISONE 10 MG (21) PO TBPK: ORAL_TABLET | Freq: Every day | ORAL | 0 refills | Status: AC

## 2019-12-12 MED ORDER — DEXAMETHASONE SODIUM PHOSPHATE 10 MG/ML IJ SOLN
INTRAMUSCULAR | Status: AC
  Filled 2019-12-12: qty 1

## 2019-12-12 MED ORDER — HYDROCODONE-HOMATROPINE 5-1.5 MG/5ML PO SYRP: 5.0000 mL | ORAL_SOLUTION | Freq: Four times a day (QID) | ORAL | 0 refills | Status: DC | PRN

## 2019-12-12 MED ORDER — DEXAMETHASONE SODIUM PHOSPHATE 10 MG/ML IJ SOLN
10.0000 mg | Freq: Once | INTRAMUSCULAR | Status: AC
  Administered 2019-12-12: 10 mg via INTRAMUSCULAR

## 2019-12-12 MED ORDER — CIPROFLOXACIN HCL 500 MG PO TABS: 500.0000 mg | ORAL_TABLET | Freq: Two times a day (BID) | ORAL | 0 refills | Status: DC

## 2019-12-12 NOTE — ED Triage Notes (Addendum)
Pt presents to UC for dry non productive cough, sneeze, and runny nose. Pt states she was seen 5/26 for same, no symptom improvement. Pt has been treating with Claritin, and Nyquil with no relief. Pt denies sore throat, runny nose, fever, chills body aches, loss of taste or smell, n/v/d. Pt declining covid testing at this time.

## 2019-12-12 NOTE — Discharge Instructions (Signed)
You received a steroid injection in the office today   I have sent in a prednisone taper for you to take for 6 days. 6 tablets on day one, 5 tablets on day two, 4 tablets on day three, 3 tablets on day four, 2 tablets on day five, and 1 tablet on day six.  I have sent in Cipro for you to take twice daily for 7 days  It may help to take Allegra as well

## 2019-12-13 NOTE — ED Provider Notes (Signed)
[place]   [id number] 12/12/19 Arrival Time: 1930  MP:NTIR THROAT  SUBJECTIVE: History from: patient.  Kaitlyn Hays is a 39 y.o. female who presents with abrupt onset of nasal congestion, headache, sinus pain, cough, wheezing that has been present for the last 2 months but has gotten worse over the last couple of days.  She was treated here in May for bronchitis and sinusitis.  Reports that it never completely resolved.  She was prescribed a steroid taper, doxycycline, albuterol inhaler, Tessalon Perles, and Hycodan cough syrup. There are no aggravating symptoms. Reports previous symptoms in the past.  Reports that she does not have insurance and does not have a primary care provider.  Denies fever, chills, ear pain, sinus pain, rhinorrhea,  SOB,  chest pain, nausea, rash, changes in bowel or bladder habits.     ROS: As per HPI.  All other pertinent ROS negative.     Past Medical History:  Diagnosis Date  . Anemia   . Fibroids   . Gonorrhea   . Tibia fracture 08/07/2013   Past Surgical History:  Procedure Laterality Date  . NO PAST SURGERIES    . TIBIA IM NAIL INSERTION Left 08/07/2013   Procedure: INTRAMEDULLARY (IM) NAIL TIBIAL;  Surgeon: Mauri Pole, MD;  Location: [facility];  Service: Orthopedics;  Laterality: Left;   No Known Allergies No current facility-administered medications on file prior to encounter.   Current Outpatient Medications on File Prior to Encounter  Medication Sig Dispense Refill  . albuterol (VENTOLIN HFA) 108 (90 Base) MCG/ACT inhaler Inhale 1-2 puffs into the lungs every 6 (six) hours as needed for wheezing or shortness of breath. 8 g 0  . Iron, Ferrous Sulfate, 325 (65 Fe) MG TABS Take 1 tablet by mouth 3 (three) times daily with meals. 90 tablet 0  . megestrol (MEGACE) 40 MG tablet Take two tablets three times daily for 3 days, then two tablets twice daily for 3 days, then one tablet twice daily 90 tablet 3   Social History    Socioeconomic History  . Marital status: Significant Other    Spouse name: Not on file  . Number of children: Not on file  . Years of education: Not on file  . Highest education level: Not on file  Occupational History  . Not on file  Tobacco Use  . Smoking status: Never Smoker  . Smokeless tobacco: Never Used  Vaping Use  . Vaping Use: Never used  Substance and Sexual Activity  . Alcohol use: No  . Drug use: No  . Sexual activity: Yes    Partners: Female    Birth control/protection: Inserts  Other Topics Concern  . Not on file  Social History Narrative  . Not on file   Social Determinants of Health   Financial Resource Strain:   . Difficulty of Paying Living Expenses:   Food Insecurity: No Food Insecurity  . Worried About Charity fundraiser in the Last Year: Never true  . Ran Out of Food in the Last Year: Never true  Transportation Needs: No Transportation Needs  . Lack of Transportation (Medical): No  . Lack of Transportation (Non-Medical): No  Physical Activity:   . Days of Exercise per Week:   . Minutes of Exercise per Session:   Stress:   . Feeling of Stress :   Social Connections:   . Frequency of Communication with Friends and Family:   . Frequency of Social Gatherings with Friends and Family:   .  Attends Religious Services:   . Active Member of Clubs or Organizations:   . Attends Archivist Meetings:   Marland Kitchen Marital Status:   Intimate Partner Violence:   . Fear of Current or Ex-Partner:   . Emotionally Abused:   Marland Kitchen Physically Abused:   . Sexually Abused:    Family History  Problem Relation Age of Onset  . Hypertension Other     OBJECTIVE:  Vitals:   12/12/19 1947  BP: 132/76  Pulse: (!) 104  Resp: 16  Temp: 97.9 F (36.6 C)  TempSrc: Oral  SpO2: 100%     General appearance: alert; appears fatigued, but nontoxic, speaking in full sentences and managing own secretions HEENT: NCAT; Ears: EACs clear, TMs pearly gray with visible  cone of light, without erythema; Eyes: PERRL, EOMI grossly; Nose: no obvious rhinorrhea; Throat: oropharynx clear, tonsils 1+ and mildly erythematous without white tonsillar exudates, uvula midline; sinus tenderness present Neck: supple without LAD Lungs: Wheezing noted in bilateral lung fields, cough absent Heart: regular rate and rhythm.  Radial pulses 2+ symmetrical bilaterally Skin: warm and dry Psychological: alert and cooperative; normal mood and affect  LABS: No results found for this or any previous visit (from the past 24 hour(s)).   ASSESSMENT & PLAN:  1. Cough   2. Acute recurrent frontal sinusitis   3. Acute bronchitis, unspecified organism     Meds ordered this encounter  Medications  . ciprofloxacin (CIPRO) 500 MG tablet    Sig: Take 1 tablet (500 mg total) by mouth 2 (two) times daily.    Dispense:  14 tablet    Refill:  0    Order Specific Question:   Supervising Provider    Answer:   Chase Picket [id number]  . fluconazole (DIFLUCAN) 150 MG tablet    Sig: Take one tablet at the onset of symptoms, if still having symptoms in 3 days, take the second tablet.    Dispense:  2 tablet    Refill:  0    Order Specific Question:   Supervising Provider    Answer:   Chase Picket [id number]  . predniSONE (STERAPRED UNI-PAK 21 TAB) 10 MG (21) TBPK tablet    Sig: Take by mouth daily for 6 days. Take 6 tablets on day 1, 5 tablets on day 2, 4 tablets on day 3, 3 tablets on day 4, 2 tablets on day 5, 1 tablet on day 6    Dispense:  21 tablet    Refill:  0    Order Specific Question:   Supervising Provider    Answer:   Chase Picket [id number]  . dexamethasone (DECADRON) injection 10 mg  . HYDROcodone-homatropine (HYCODAN) 5-1.5 MG/5ML syrup    Sig: Take 5 mLs by mouth every 6 (six) hours as needed for cough.    Dispense:  120 mL    Refill:  0    Order Specific Question:   Supervising Provider    Answer:   Chase Picket [id number]    Acute Sinusitis Push  fluids and get rest Prescribed amoxicillin 875mg  twice daily for 10 days.  Take as directed and to completion.  Drink warm or cool liquids, use throat lozenges, or popsicles to help alleviate symptoms Take OTC ibuprofen or tylenol as needed for pain Discussed with patient that she may need an allergist or an asthma specialist, to help keep her allergies under control so she will not be experiencing recurrent sinus infections and bronchitis Follow up with PCP  if symptoms persist Return or go to ER if you have any new or worsening symptoms such as fever, chills, nausea, vomiting, worsening sore throat, cough, abdominal pain, chest pain, changes in bowel or bladder habits.   Reviewed expectations re: course of current medical issues. Questions answered. Outlined signs and symptoms indicating need for more acute intervention. Patient verbalized understanding. After Visit Summary given.           Faustino Congress, NP 12/13/19 1050

## 2019-12-17 ENCOUNTER — Telehealth: Payer: Self-pay | Admitting: Lactation Services

## 2019-12-17 NOTE — Telephone Encounter (Signed)
Called patient to inform her she needs to come by to sign her Hysterectomy Statement. Was not able to reach patient, LM for patient to call the office as soon as possible.

## 2019-12-18 ENCOUNTER — Telehealth: Payer: Self-pay

## 2019-12-18 NOTE — Telephone Encounter (Signed)
L/M for pt that I am calling requesting that she comes in to signs a form before her surgery can be done. If she could please come to the office during office hours to fill out form.  If she has questions to please call the office.    Mel Almond, RN

## 2019-12-18 NOTE — Telephone Encounter (Addendum)
Attempted to call patient again to ask her to come to the office to sign her Hysterectomy Statement. Per George Hugh if not signed by 7/9 her surgery will be cancelled. Patient has been called several times.   LM that she needs to call the office urgently and is not her surgery may be cancelled.   Patient not active with my Chart so unable to send message.   Called father's number on chart, no answer. LM for him to have patient call the office.   Letter created and sent to patient.

## 2019-12-30 ENCOUNTER — Other Ambulatory Visit: Payer: Self-pay

## 2019-12-30 ENCOUNTER — Ambulatory Visit (INDEPENDENT_AMBULATORY_CARE_PROVIDER_SITE_OTHER)
Admission: RE | Admit: 2019-12-30 | Discharge: 2019-12-30 | Disposition: A | Discharge status: Home / Self Care | Payer: Self-pay | Source: Ambulatory Visit | Attending: Internal Medicine | Admitting: Internal Medicine

## 2019-12-30 ENCOUNTER — Encounter: Payer: Self-pay | Admitting: Internal Medicine

## 2019-12-30 ENCOUNTER — Ambulatory Visit (INDEPENDENT_AMBULATORY_CARE_PROVIDER_SITE_OTHER): Payer: Self-pay | Admitting: Internal Medicine

## 2019-12-30 VITALS — BP 114/70 | HR 108 | Temp 98.2°F | Ht 71.0 in | Wt 228.0 lb

## 2019-12-30 DIAGNOSIS — Z7185 Encounter for immunization safety counseling: Secondary | ICD-10-CM

## 2019-12-30 DIAGNOSIS — R062 Wheezing: Secondary | ICD-10-CM

## 2019-12-30 DIAGNOSIS — R053 Chronic cough: Secondary | ICD-10-CM

## 2019-12-30 DIAGNOSIS — R05 Cough: Secondary | ICD-10-CM

## 2019-12-30 DIAGNOSIS — Z7189 Other specified counseling: Secondary | ICD-10-CM

## 2019-12-30 DIAGNOSIS — N393 Stress incontinence (female) (male): Secondary | ICD-10-CM

## 2019-12-30 MED ORDER — BREO ELLIPTA 100-25 MCG/INH IN AEPB: 1.0000 | INHALATION_SPRAY | Freq: Every day | RESPIRATORY_TRACT | 5 refills | Status: DC

## 2019-12-30 MED ORDER — BREO ELLIPTA 100-25 MCG/INH IN AEPB: 1.0000 | INHALATION_SPRAY | Freq: Every day | RESPIRATORY_TRACT | 0 refills | Status: DC

## 2019-12-30 NOTE — Patient Instructions (Addendum)
ICD-10-CM   1. Chronic cough  R05   2. Wheezing  R06.2   3. Vaccine counseling  Z71.89     Possibilities include cough variant asthma and post viral reactive cough  Plan -Check blood Covid IgG antibody 12/30/2019  -Check blood CBC with differential, blood IgE 12/30/2019 -Check chest x-ray today 12/30/2019  -Check full pulmonary function test first available -Check exhaled nitric oxide test first available  -Start empiric steroid inhaler - breo lower dose - 1 puff daily  - start albuterol as needed  -Talk to primary care physician or get a referral to a gynecologist or urologist for urinary incontinence associated with coughing  -If these tests are stable and you do not have active asthma that will be a good time to have Covid vaccine -Please continue masking and social distancing given the rise of the delta variant.  Followup < 2 weeks but after completing above - to see Nurse practtioner

## 2019-12-30 NOTE — Progress Notes (Signed)
OV 12/30/2019  Subjective:  [name], female , DOB: January 27, 1981 , age 39 y.o. , MRN: [id number] , ADDRESS: [address] [place] [address]  PCP [name], MD \ 12/30/2019 -   Chief Complaint  Patient presents with  . Consult     HPI Kaitlyn Hays 39 y.o. -is a new consult.  She is here for chronic cough for the last 8 weeks.  She tells me is of insidious onset.  It is stable but severe.  It is waking her up at night and associated with wheezing.  There is also bilateral muscle pull in the infrascapular region because of the cough.  The cough is moderate to severe.  There is no fever.  There is also sinus congestion.  Prior to this she has never had cough there is no ACE inhibitor there is no acid reflux.  She does not have any known allergies other than in the springtime she does have some some sinus congestion that is mild.  But has never had cough or wheezing in the past.  She denies any viral infection in the past.  However she is not had any Covid vaccine.  This because she is scared of the Covid vaccine.  There is no vomiting with the cough.  No shortness of breath.  No orthopnea but she is waking up because of coughing.  No fever or chills or weight loss.  No diplopia.  No stroke symptoms.  She has urinary incontinence with coughing.     Results for Kaitlyn Hays (MRN [id number]) as of 12/30/2019 14:56  Ref. Range 08/19/2019 14:55 09/16/2019 14:59 09/16/2019 16:29 10/08/2019 09:13  Hemoglobin Latest Ref Range: 11.1 - 15.9 g/dL 8.6 (L)   10.6 (L)    ROS - per HPI Results for Kaitlyn Hays (MRN [id number]) as of 12/30/2019 14:56  Ref. Range 12/29/2008 17:01 07/08/2009 21:05 01/12/2015 15:52  Eosinophils Absolute Latest Ref Range: 0 - 0 K/uL 0.1 0.1 0.2   CXR 2015  Narrative  CLINICAL DATA: Cough, shortness of breath.   EXAM:  CHEST 2 VIEW   COMPARISON: None.   FINDINGS:  The heart size and mediastinal contours are within normal limits.   Both lungs are clear. No pleural effusion or pneumothorax is noted.  The visualized skeletal structures are unremarkable.   IMPRESSION:  No active cardiopulmonary disease.    Electronically Signed  By: Sabino Dick M.D.  On: 06/27/2013 13:50       has a past medical history of Anemia, Fibroids, Gonorrhea, and Tibia fracture (08/07/2013).   reports that she has never smoked. She has never used smokeless tobacco.  Past Surgical History:  Procedure Laterality Date  . NO PAST SURGERIES    . TIBIA IM NAIL INSERTION Left 08/07/2013   Procedure: INTRAMEDULLARY (IM) NAIL TIBIAL;  Surgeon: Mauri Pole, MD;  Location: [facility];  Service: Orthopedics;  Laterality: Left;    No Known Allergies   There is no immunization history on file for this patient.  Family History  Problem Relation Age of Onset  . Hypertension Other      Current Outpatient Medications:  .  albuterol (VENTOLIN HFA) 108 (90 Base) MCG/ACT inhaler, Inhale 1-2 puffs into the lungs every 6 (six) hours as needed for wheezing or shortness of breath., Disp: 8 g, Rfl: 0 .  Iron, Ferrous Sulfate, 325 (65 Fe) MG TABS, Take 1 tablet by mouth 3 (three) times daily with meals., Disp:  90 tablet, Rfl: 0 .  megestrol (MEGACE) 40 MG tablet, Take two tablets three times daily for 3 days, then two tablets twice daily for 3 days, then one tablet twice daily (Patient taking differently: Take 80 mg by mouth daily. ), Disp: 90 tablet, Rfl: 3 .  HYDROcodone-homatropine (HYCODAN) 5-1.5 MG/5ML syrup, Take 5 mLs by mouth every 6 (six) hours as needed for cough. (Patient not taking: Reported on 12/30/2019), Disp: 120 mL, Rfl: 0      Objective:   Vitals:   12/30/19 1445  BP: 114/70  Pulse: (!) 108  Temp: 98.2 F (36.8 C)  TempSrc: Oral  SpO2: 100%  Weight: 228 lb (103.4 kg)  Height: 5\' 11"  (1.803 m)    Estimated body mass index is 31.8 kg/m as calculated from the following:   Height as of this encounter: 5\' 11"  (1.803 m).    Weight as of this encounter: 228 lb (103.4 kg).  @WEIGHTCHANGE @  [employer]   12/30/19 1445  Weight: 228 lb (103.4 kg)     Physical Exam  General Appearance:    Alert, cooperative, no distress, appears stated age - no , Deconditioned looking - no , OBESE  - no, Sitting on Wheelchair -  no  Head:    Normocephalic, without obvious abnormality, atraumatic  Eyes:    PERRL, conjunctiva/corneas clear,  Ears:    Normal TM's and external ear canals, both ears  Nose:   Nares normal, septum midline, mucosa normal, no drainage    or sinus tenderness. OXYGEN ON  - no . Patient is @ ra   Throat:   Lips, mucosa, and tongue normal; teeth and gums normal. Cyanosis on lips - no  Neck:   Supple, symmetrical, trachea midline, no adenopathy;    thyroid:  no enlargement/tenderness/nodules; no carotid   bruit or JVD  Back:     Symmetric, no curvature, ROM normal, no CVA tenderness  Lungs:     Distress - no , Wheeze no, Barrell Chest - no, Purse lip breathing - no, Crackles - no   Chest Wall:    No tenderness or deformity.    Heart:    Regular rate and rhythm, S1 and S2 normal, no rub   or gallop, Murmur - no  Breast Exam:    NOT DONE  Abdomen:     Soft, non-tender, bowel sounds active all four quadrants,    no masses, no organomegaly. Visceral obesity - no  Genitalia:   NOT DONE  Rectal:   NOT DONE  Extremities:   Extremities - normal, Has Cane - no, Clubbing - no, Edema - no  Pulses:   2+ and symmetric all extremities  Skin:   Stigmata of Connective Tissue Disease - no  Lymph nodes:   Cervical, supraclavicular, and axillary nodes normal  Psychiatric:  Neurologic:   Pleasant - yes, Anxious - no, Flat affect - no  CAm-ICU - neg, Alert and Oriented x 3 - yes, Moves all 4s - yes, Speech - normal, Cognition - intact           Assessment:       ICD-10-CM   1. Chronic cough  R05   2. Wheezing  R06.2   3. Vaccine counseling  Z71.89   4. Stress incontinence of urine  N39.3        Plan:      Patient Instructions     ICD-10-CM   1. Chronic cough  R05   2. Wheezing  R06.2  3. Vaccine counseling  Z71.89     Possibilities include cough variant asthma and post viral reactive cough  Plan -Check blood Covid IgG antibody 12/30/2019  -Check blood CBC with differential, blood IgE 12/30/2019 -Check chest x-ray today 12/30/2019  -Check full pulmonary function test first available -Check exhaled nitric oxide test first available  -Start empiric steroid inhaler - breo lower dose - 1 puff daily  - start albuterol as needed  -Talk to primary care physician or get a referral to a gynecologist or urologist for urinary incontinence associated with coughing  -If these tests are stable and you do not have active asthma that will be a good time to have Covid vaccine -Please continue masking and social distancing given the rise of the delta variant.  Followup < 2 weeks but after completing above - to see Nurse practtioner      SIGNATURE    Dr. Brand Males, M.D., F.C.C.P,  Pulmonary and Critical Care Medicine Staff Physician, [place] Director - Interstitial Lung Disease  Program  Pulmonary [place] [facility] [place], [place], [address]  Pager: [phone], If no answer or between  15:00h - 7:00h: call [phone] Telephone: [phone]  3:15 PM 12/30/2019

## 2019-12-31 ENCOUNTER — Inpatient Hospital Stay: Admit: 2019-12-31 | Payer: Self-pay | Admitting: Obstetrics and Gynecology

## 2019-12-31 LAB — CBC WITH DIFFERENTIAL/PLATELET
Basophils Absolute: 0 10*3/uL (ref 0.0–0.1)
Basophils Relative: 0.6 % (ref 0.0–3.0)
Eosinophils Absolute: 0.6 10*3/uL (ref 0.0–0.7)
Eosinophils Relative: 9.1 % — ABNORMAL HIGH (ref 0.0–5.0)
HCT: 35.7 % — ABNORMAL LOW (ref 36.0–46.0)
Hemoglobin: 11.3 g/dL — ABNORMAL LOW (ref 12.0–15.0)
Lymphocytes Relative: 18.3 % (ref 12.0–46.0)
Lymphs Abs: 1.2 10*3/uL (ref 0.7–4.0)
MCHC: 31.7 g/dL (ref 30.0–36.0)
MCV: 69.1 fl — ABNORMAL LOW (ref 78.0–100.0)
Monocytes Absolute: 0.9 10*3/uL (ref 0.1–1.0)
Monocytes Relative: 13.9 % — ABNORMAL HIGH (ref 3.0–12.0)
Neutro Abs: 3.7 10*3/uL (ref 1.4–7.7)
Neutrophils Relative %: 58.1 % (ref 43.0–77.0)
Platelets: 288 10*3/uL (ref 150.0–400.0)
RBC: 5.17 Mil/uL — ABNORMAL HIGH (ref 3.87–5.11)
RDW: 20 % — ABNORMAL HIGH (ref 11.5–15.5)
WBC: 6.4 10*3/uL (ref 4.0–10.5)

## 2019-12-31 SURGERY — HYSTERECTOMY, TOTAL, ABDOMINAL, WITH SALPINGECTOMY
Anesthesia: Choice | Laterality: Bilateral

## 2020-01-01 DIAGNOSIS — R05 Cough: Secondary | ICD-10-CM | POA: Insufficient documentation

## 2020-01-01 DIAGNOSIS — Z7951 Long term (current) use of inhaled steroids: Secondary | ICD-10-CM | POA: Insufficient documentation

## 2020-01-01 DIAGNOSIS — J45909 Unspecified asthma, uncomplicated: Secondary | ICD-10-CM | POA: Insufficient documentation

## 2020-01-01 LAB — SARS COV-2 SEROLOGY(COVID-19)AB(IGG,IGM),IMMUNOASSAY
SARS CoV-2 AB IgG: NEGATIVE
SARS CoV-2 IgM: NEGATIVE

## 2020-01-01 LAB — IGE: IgE (Immunoglobulin E), Serum: 91 kU/L (ref <=114)

## 2020-01-02 ENCOUNTER — Encounter (HOSPITAL_COMMUNITY): Payer: Self-pay

## 2020-01-02 ENCOUNTER — Emergency Department (HOSPITAL_COMMUNITY): Payer: Self-pay

## 2020-01-02 ENCOUNTER — Emergency Department (HOSPITAL_COMMUNITY)
Admission: EM | Admit: 2020-01-02 | Discharge: 2020-01-02 | Disposition: A | Discharge status: Home / Self Care | Payer: Self-pay | Attending: Emergency Medicine | Admitting: Emergency Medicine

## 2020-01-02 DIAGNOSIS — R059 Cough, unspecified: Secondary | ICD-10-CM

## 2020-01-02 HISTORY — DX: Unspecified asthma, uncomplicated: J45.909

## 2020-01-02 MED ORDER — FLUTICASONE PROPIONATE 50 MCG/ACT NA SUSP: 1.0000 | Freq: Every day | NASAL | 2 refills | Status: DC

## 2020-01-02 MED ORDER — KETOROLAC TROMETHAMINE 60 MG/2ML IM SOLN
60.0000 mg | Freq: Once | INTRAMUSCULAR | Status: AC
  Administered 2020-01-02: 60 mg via INTRAMUSCULAR
  Filled 2020-01-02: qty 2

## 2020-01-02 MED ORDER — CETIRIZINE HCL 10 MG PO TABS
10.0000 mg | ORAL_TABLET | Freq: Every day | ORAL | 2 refills | Status: DC
Start: 2020-01-02 — End: 2022-08-24

## 2020-01-02 MED ORDER — PREDNISONE 20 MG PO TABS
40.0000 mg | ORAL_TABLET | Freq: Every day | ORAL | 0 refills | Status: DC
Start: 2020-01-02 — End: 2020-02-24

## 2020-01-02 MED ORDER — HYDROCODONE-HOMATROPINE 5-1.5 MG/5ML PO SYRP: 5.0000 mL | ORAL_SOLUTION | Freq: Four times a day (QID) | ORAL | 0 refills | Status: DC | PRN

## 2020-01-02 MED ORDER — ALBUTEROL SULFATE (2.5 MG/3ML) 0.083% IN NEBU
5.0000 mg | INHALATION_SOLUTION | Freq: Once | RESPIRATORY_TRACT | Status: AC
  Administered 2020-01-02: 5 mg via RESPIRATORY_TRACT
  Filled 2020-01-02: qty 6

## 2020-01-02 NOTE — ED Provider Notes (Signed)
[facility] DEPT Provider Note   CSN: [id number] Arrival date & time: 01/01/20  2335     History No chief complaint on file.   Kaitlyn Hays is a 39 y.o. female.  Patient is a 39 year old female with a history of anemia, fibroids and now chronic cough for the last 8 weeks who is presenting today because she had a coughing fit and is now having severe pain in her left side.  Patient saw pulmonology with [name] 4 days ago and at that time was diagnosed with potential asthma.  She has not had the definitive testing yet but was started on a Breo inhaler and given albuterol to use as needed.  She reports so far this is not helped with her cough.  She continues to only have a clear mucus intermittently and denies any fever.  She denies any shortness of breath but does have wheezing.  Her biggest complaint is her 10 out of 10 severe pain in her left side that is worse with any type of movement and taking deep breaths.  She reports nothing she has tried thus far as helped with her cough.  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  She does not smoke cigarettes and denies any drug use.  She has not been vaccinated against Covid but it also has not had Covid as far she knows.  She plans to follow-up with pulmonology but at this time needed something additional for the pain.  Patient did receive albuterol while in the waiting room but does not feel like it helped much with the cough.  She does not have any posttussive emesis or abdominal pain at this time.  She reports she is supposed to get a hysterectomy but given everything going on with the cough she had to cancel until the cough was under control before getting the surgery.  The history is provided by the patient.       Past Medical History:  Diagnosis Date  . Anemia   . Asthma   . Fibroids   . Gonorrhea    . Tibia fracture 08/07/2013    Patient Active Problem List   Diagnosis Date Noted  . Abnormal Pap smear of cervix 08/26/2019  . DUB (dysfunctional uterine bleeding) 01/12/2015  . Encounter for routine gynecological examination 11/29/2012  . Iron deficiency anemia 11/01/2011  . Fibroids 11/01/2011  . ANEMIA 07/08/2009    Past Surgical History:  Procedure Laterality Date  . NO PAST SURGERIES    . TIBIA IM NAIL INSERTION Left 08/07/2013   Procedure: INTRAMEDULLARY (IM) NAIL TIBIAL;  Surgeon: Mauri Pole, MD;  Location: [facility];  Service: Orthopedics;  Laterality: Left;     OB History    Gravida  0   Para  0   Term  0   Preterm  0   AB  0   Living  0     SAB  0   TAB  0   Ectopic  0   Multiple  0   Live Births              Family History  Problem Relation Age of Onset  . Hypertension Other     Social History   Tobacco Use  . Smoking status: Never Smoker  . Smokeless tobacco: Never Used  Vaping Use  . Vaping Use:  Never used  Substance Use Topics  . Alcohol use: No  . Drug use: No    Home Medications Prior to Admission medications   Medication Sig Start Date End Date Taking? Authorizing Provider  albuterol (VENTOLIN HFA) 108 (90 Base) MCG/ACT inhaler Inhale 1-2 puffs into the lungs every 6 (six) hours as needed for wheezing or shortness of breath. 11/04/19   Wieters, Hallie C, PA-C  cetirizine (ZYRTEC ALLERGY) 10 MG tablet Take 1 tablet (10 mg total) by mouth daily. 01/02/20   Blanchie Dessert, MD  fluticasone (FLONASE) 50 MCG/ACT nasal spray Place 1 spray into both nostrils daily. 01/02/20   Blanchie Dessert, MD  fluticasone furoate-vilanterol (BREO ELLIPTA) 100-25 MCG/INH AEPB Inhale 1 puff into the lungs daily. 12/30/19   Brand Males, MD  fluticasone furoate-vilanterol (BREO ELLIPTA) 100-25 MCG/INH AEPB Inhale 1 puff into the lungs daily. 12/30/19   Brand Males, MD  HYDROcodone-homatropine (HYCODAN) 5-1.5 MG/5ML syrup Take 5 mLs by  mouth every 6 (six) hours as needed for cough. 01/02/20   Blanchie Dessert, MD  Iron, Ferrous Sulfate, 325 (65 Fe) MG TABS Take 1 tablet by mouth 3 (three) times daily with meals. 08/21/19   [name], MD  megestrol (MEGACE) 40 MG tablet Take two tablets three times daily for 3 days, then two tablets twice daily for 3 days, then one tablet twice daily Patient taking differently: Take 80 mg by mouth daily.  10/08/19   [name], MD  predniSONE (DELTASONE) 20 MG tablet Take 2 tablets (40 mg total) by mouth daily. 01/02/20   Blanchie Dessert, MD    Allergies    Patient has no known allergies.  Review of Systems   Review of Systems  All other systems reviewed and are negative.   Physical Exam Updated Vital Signs BP (!) 154/98 (BP Location: Left Arm)   Pulse 94   Temp 98.3 F (36.8 C) (Oral)   Resp 16   Ht 5\' 11"  (1.803 m)   Wt 103.4 kg   SpO2 98%   BMI 31.80 kg/m   Physical Exam Vitals and nursing note reviewed.  Constitutional:      General: She is not in acute distress.    Appearance: Normal appearance. She is well-developed and normal weight.  HENT:     Head: Normocephalic and atraumatic.     Right Ear: Tympanic membrane normal.     Left Ear: Tympanic membrane normal.     Nose:     Comments: Swollen nasal turbinates bilaterally    Mouth/Throat:     Mouth: Mucous membranes are moist.  Eyes:     Pupils: Pupils are equal, round, and reactive to light.  Cardiovascular:     Rate and Rhythm: Normal rate and regular rhythm.     Heart sounds: Normal heart sounds. No murmur heard.  No friction rub.  Pulmonary:     Effort: Pulmonary effort is normal. No tachypnea.     Breath sounds: Decreased breath sounds present. No wheezing or rales.     Comments: Decreased breath sounds due to effort related to pain.  Tenderness with palpation to the chest over the left lateral chest wall Chest:     Chest wall: Tenderness present.  Musculoskeletal:        General: No  tenderness. Normal range of motion.     Comments: No edema  Skin:    General: Skin is warm and dry.     Findings: No rash.  Neurological:     General:  No focal deficit present.     Mental Status: She is alert and oriented to person, place, and time. Mental status is at baseline.     Cranial Nerves: No cranial nerve deficit.  Psychiatric:        Mood and Affect: Mood normal.        Behavior: Behavior normal.        Thought Content: Thought content normal.      ED Results / Procedures / Treatments   Labs (all labs ordered are listed, but only abnormal results are displayed) Labs Reviewed - No data to display  EKG None  Radiology DG Chest 2 View  Result Date: 01/02/2020 CLINICAL DATA:  Dry cough for 2 days, asthma EXAM: CHEST - 2 VIEW COMPARISON:  12/30/2019 FINDINGS: The heart size and mediastinal contours are within normal limits. Both lungs are clear. The visualized skeletal structures are unremarkable. IMPRESSION: No active cardiopulmonary disease. Electronically Signed   By: Randa Ngo M.D.   On: 01/02/2020 00:47    Procedures Procedures (including critical care time)  Medications Ordered in ED Medications  ketorolac (TORADOL) injection 60 mg (has no administration in time range)  albuterol (PROVENTIL) (2.5 MG/3ML) 0.083% nebulizer solution 5 mg (5 mg Nebulization Given 01/02/20 0044)    ED Course  I have reviewed the triage vital signs and the nursing notes.  Pertinent labs & imaging results that were available during my care of the patient were reviewed by me and considered in my medical decision making (see chart for details).    MDM Rules/Calculators/A&P                          Patient presenting with persistent cough and is recently followed up with pulmonology and is being tested for adult onset asthma.  Patient recently started on Breo inhaler and also has albuterol.  Initially upon arrival 10 hours ago patient was wheezing but is no longer wheezing.  She  has significant pain in her left side and concern for possible rib fracture versus pulled muscle due to excessive coughing.  Patient is not ill-appearing and chest x-ray today shows no evidence of pneumothorax or rib fracture.  Encourage patient to continue the inhalers but will also try a steroid burst with prednisone, given cough medicine with pain control and also will try Zyrtec and Flonase as patient does complain of nasal congestion.  Patient plans on following up with pulmonology and encouraged to try Voltaren gel as well for pain.  MDM Number of Diagnoses or Management Options   Amount and/or Complexity of Data Reviewed Tests in the radiology section of CPT: ordered and reviewed Decide to obtain previous medical records or to obtain history from someone other than the patient: yes Review and summarize past medical records: yes Independent visualization of images, tracings, or specimens: yes  Risk of Complications, Morbidity, and/or Mortality Presenting problems: moderate Diagnostic procedures: low Management options: low  Patient Progress Patient progress: stable  Final Clinical Impression(s) / ED Diagnoses Final diagnoses:  Cough in adult    Rx / DC Orders ED Discharge Orders         Ordered    HYDROcodone-homatropine (HYCODAN) 5-1.5 MG/5ML syrup  Every 6 hours PRN     Discontinue  Reprint     01/02/20 0836    predniSONE (DELTASONE) 20 MG tablet  Daily     Discontinue  Reprint     01/02/20 0836    fluticasone (FLONASE) 50  MCG/ACT nasal spray  Daily     Discontinue  Reprint     01/02/20 0836    cetirizine (ZYRTEC ALLERGY) 10 MG tablet  Daily     Discontinue  Reprint     01/02/20 7903           Blanchie Dessert, MD 01/02/20 [phone]

## 2020-01-02 NOTE — ED Triage Notes (Signed)
Pt complains of a dry cough since May, she has been seen for the same and was told by her primary that she has asthma and was given an inhaler  Tonight she states that she's coughed so hard and much that it feels like she's pulled something in her sides

## 2020-01-02 NOTE — Discharge Instructions (Signed)
Try starting the allergy medication to see if you are having some postnasal drip that is causing the cough.  Also will try steroids as well as the inhaler that you just received and the pain medicine cough syrup.  You can also use Voltaren gel to rub in the area where it is painful.

## 2020-01-20 ENCOUNTER — Other Ambulatory Visit (HOSPITAL_COMMUNITY): Payer: Self-pay

## 2020-01-23 ENCOUNTER — Ambulatory Visit: Payer: Self-pay | Admitting: Adult Health

## 2020-01-26 ENCOUNTER — Telehealth: Payer: Self-pay | Admitting: Internal Medicine

## 2020-01-26 NOTE — Telephone Encounter (Signed)
rsults from mid July   - covid ab negative  - cxr  Clear - cbc shows high eos (600) suggesting symptoms are asthma related  Plan  - keep feno/pft appt or make one per note 12/30/19 -> then see APP or myself

## 2020-01-29 NOTE — Telephone Encounter (Signed)
Attempted to call pt but unable to reach. Left message for pt to return call.  Pt needs to be given results of recent tests and also needs to be scheduled for PFT and also needs to be scheduled OV with either MR or APP after PFT.  We do not have documentation that pt has had Covid vaccine. If pt has not had the vaccine, she will need to be covid tested prior to having the PFT performed.

## 2020-01-30 NOTE — Telephone Encounter (Signed)
Called and spoke with Patient.  Results and recommendations from Dr Chase Caller given.  Understanding stated. Patient scheduled for Covid test.  Covid testing [place] address given. Patient scheduled for PFT 02/24/20, and follow up with Beth,NP. Nothing further at this time.

## 2020-01-30 NOTE — Telephone Encounter (Signed)
Pt returning a phone call for her test results. Pt has ben rescheduled for PFT and OV also Covid Test. Pt can be reached at [phone].

## 2020-02-21 ENCOUNTER — Other Ambulatory Visit (HOSPITAL_COMMUNITY)
Admission: RE | Admit: 2020-02-21 | Discharge: 2020-02-21 | Disposition: A | Discharge status: Home / Self Care | Payer: HRSA Program | Source: Ambulatory Visit | Attending: Internal Medicine | Admitting: Internal Medicine

## 2020-02-21 DIAGNOSIS — Z20822 Contact with and (suspected) exposure to covid-19: Secondary | ICD-10-CM | POA: Insufficient documentation

## 2020-02-21 DIAGNOSIS — Z01812 Encounter for preprocedural laboratory examination: Secondary | ICD-10-CM | POA: Diagnosis present

## 2020-02-21 LAB — SARS CORONAVIRUS 2 (TAT 6-24 HRS): SARS Coronavirus 2: NEGATIVE

## 2020-02-24 ENCOUNTER — Encounter: Payer: Self-pay | Admitting: Primary Care

## 2020-02-24 ENCOUNTER — Ambulatory Visit (INDEPENDENT_AMBULATORY_CARE_PROVIDER_SITE_OTHER): Payer: Self-pay | Admitting: Internal Medicine

## 2020-02-24 ENCOUNTER — Other Ambulatory Visit: Payer: Self-pay

## 2020-02-24 ENCOUNTER — Ambulatory Visit (INDEPENDENT_AMBULATORY_CARE_PROVIDER_SITE_OTHER): Payer: Self-pay | Admitting: Primary Care

## 2020-02-24 ENCOUNTER — Telehealth: Payer: Self-pay | Admitting: Primary Care

## 2020-02-24 DIAGNOSIS — K219 Gastro-esophageal reflux disease without esophagitis: Secondary | ICD-10-CM | POA: Insufficient documentation

## 2020-02-24 DIAGNOSIS — R053 Chronic cough: Secondary | ICD-10-CM

## 2020-02-24 DIAGNOSIS — K5900 Constipation, unspecified: Secondary | ICD-10-CM

## 2020-02-24 DIAGNOSIS — R05 Cough: Secondary | ICD-10-CM

## 2020-02-24 DIAGNOSIS — J4520 Mild intermittent asthma, uncomplicated: Secondary | ICD-10-CM

## 2020-02-24 LAB — PULMONARY FUNCTION TEST
DL/VA % pred: 149 %
DL/VA: 6.33 ml/min/mmHg/L
DLCO cor % pred: 100 %
DLCO cor: 27.5 ml/min/mmHg
DLCO unc % pred: 100 %
DLCO unc: 27.5 ml/min/mmHg
FEF 25-75 Post: 1.73 L/sec
FEF 25-75 Pre: 1.69 L/sec
FEF2575-%Change-Post: 2 %
FEF2575-%Pred-Post: 51 %
FEF2575-%Pred-Pre: 50 %
FEV1-%Change-Post: 0 %
FEV1-%Pred-Post: 63 %
FEV1-%Pred-Pre: 63 %
FEV1-Post: 2.03 L
FEV1-Pre: 2.03 L
FEV1FVC-%Change-Post: 7 %
FEV1FVC-%Pred-Pre: 89 %
FEV6-%Change-Post: -8 %
FEV6-%Pred-Post: 64 %
FEV6-%Pred-Pre: 70 %
FEV6-Post: 2.47 L
FEV6-Pre: 2.7 L
FEV6FVC-%Change-Post: 0 %
FEV6FVC-%Pred-Post: 100 %
FEV6FVC-%Pred-Pre: 101 %
FVC-%Change-Post: -6 %
FVC-%Pred-Post: 64 %
FVC-%Pred-Pre: 69 %
FVC-Post: 2.52 L
FVC-Pre: 2.7 L
Post FEV1/FVC ratio: 81 %
Post FEV6/FVC ratio: 99 %
Pre FEV1/FVC ratio: 75 %
Pre FEV6/FVC Ratio: 100 %
RV % pred: 111 %
RV: 2.16 L
TLC % pred: 75 %
TLC: 4.62 L

## 2020-02-24 MED ORDER — MONTELUKAST SODIUM 10 MG PO TABS
10.0000 mg | ORAL_TABLET | Freq: Every day | ORAL | 11 refills | Status: DC
Start: 2020-02-24 — End: 2021-03-01

## 2020-02-24 MED ORDER — BUDESONIDE-FORMOTEROL FUMARATE 80-4.5 MCG/ACT IN AERO
2.0000 | INHALATION_SPRAY | Freq: Two times a day (BID) | RESPIRATORY_TRACT | 12 refills | Status: DC
Start: 2020-02-24 — End: 2020-05-15

## 2020-02-24 NOTE — Patient Instructions (Addendum)
Pulmonary function testing today showed moderate restriction in lung capacity. This is likely from body habitus and/or underlying asthma   Recommendation: - We will check with our pharmacist to see if theres options for inhaler cost  - Add Singulair 10mg  at bedtime (again wait to see cost before sending prescription) - Goal 25-30 lbs weight loss   Constipation: - Recommend daily stool softener   GERD: - Consider trying over the counter PEPCID 20mg  at bedtime  Follow-up: - 4 months with [name]   Food Choices for Gastroesophageal Reflux Disease, Adult When you have gastroesophageal reflux disease (GERD), the foods you eat and your eating habits are very important. Choosing the right foods can help ease the discomfort of GERD. Consider working with a diet and nutrition specialist (dietitian) to help you make healthy food choices. What general guidelines should I follow?  Eating plan  Choose healthy foods low in fat, such as fruits, vegetables, whole grains, low-fat dairy products, and lean meat, fish, and poultry.  Eat frequent, small meals instead of three large meals each day. Eat your meals slowly, in a relaxed setting. Avoid bending over or lying down until 2-3 hours after eating.  Limit high-fat foods such as fatty meats or fried foods.  Limit your intake of oils, butter, and shortening to less than 8 teaspoons each day.  Avoid the following: ? Foods that cause symptoms. These may be different for different people. Keep a food diary to keep track of foods that cause symptoms. ? Alcohol. ? Drinking large amounts of liquid with meals. ? Eating meals during the 2-3 hours before bed.  Cook foods using methods other than frying. This may include baking, grilling, or broiling. Lifestyle  Maintain a healthy weight. Ask your health care provider what weight is healthy for you. If you need to lose weight, work with your health care provider to do so safely.  Exercise for at  least 30 minutes on 5 or more days each week, or as told by your health care provider.  Avoid wearing clothes that fit tightly around your waist and chest.  Do not use any products that contain nicotine or tobacco, such as cigarettes and e-cigarettes. If you need help quitting, ask your health care provider.  Sleep with the head of your bed raised. Use a wedge under the mattress or blocks under the bed frame to raise the head of the bed. What foods are not recommended? The items listed may not be a complete list. Talk with your dietitian about what dietary choices are best for you. Grains Pastries or quick breads with added fat. [place] toast. Vegetables Deep fried vegetables. [place] fries. Any vegetables prepared with added fat. Any vegetables that cause symptoms. For some people this may include tomatoes and tomato products, chili peppers, onions and garlic, and horseradish. Fruits Any fruits prepared with added fat. Any fruits that cause symptoms. For some people this may include citrus fruits, such as oranges, grapefruit, pineapple, and lemons. Meats and other protein foods High-fat meats, such as fatty beef or pork, hot dogs, ribs, ham, sausage, salami and bacon. Fried meat or protein, including fried fish and fried chicken. Nuts and nut butters. Dairy Whole milk and chocolate milk. Sour cream. Cream. Ice cream. Cream cheese. Milk shakes. Beverages Coffee and tea, with or without caffeine. Carbonated beverages. Sodas. Energy drinks. Fruit juice made with acidic fruits (such as orange or grapefruit). Tomato juice. Alcoholic drinks. Fats and oils Butter. Margarine. Shortening. Ghee. Sweets and desserts Chocolate  and cocoa. Donuts. Seasoning and other foods Pepper. Peppermint and spearmint. Any condiments, herbs, or seasonings that cause symptoms. For some people, this may include curry, hot sauce, or vinegar-based salad dressings. Summary  When you have gastroesophageal reflux disease  (GERD), food and lifestyle choices are very important to help ease the discomfort of GERD.  Eat frequent, small meals instead of three large meals each day. Eat your meals slowly, in a relaxed setting. Avoid bending over or lying down until 2-3 hours after eating.  Limit high-fat foods such as fatty meat or fried foods. This information is not intended to replace advice given to you by your health care provider. Make sure you discuss any questions you have with your health care provider. Document Revised: 09/20/2018 Document Reviewed: 05/31/2016 Elsevier Patient Education  2020 [place] [employer] Calories are units of energy. Your body needs a certain amount of calories from food to keep you going throughout the day. When you eat more calories than your body needs, your body stores the extra calories as fat. When you eat fewer calories than your body needs, your body burns fat to get the energy it needs. Calorie counting means keeping track of how many calories you eat and drink each day. Calorie counting can be helpful if you need to lose weight. If you make sure to eat fewer calories than your body needs, you should lose weight. Ask your health care provider what a healthy weight is for you. For calorie counting to work, you will need to eat the right number of calories in a day in order to lose a healthy amount of weight per week. A dietitian can help you determine how many calories you need in a day and will give you suggestions on how to reach your calorie goal.  A healthy amount of weight to lose per week is usually 1-2 lb (0.5-0.9 kg). This usually means that your daily calorie intake should be reduced by 500-750 calories.  Eating 1,200 - 1,500 calories per day can help most women lose weight.  Eating 1,500 - 1,800 calories per day can help most men lose weight. What is my plan? My goal is to have __________ calories per day. If I have this many calories  per day, I should lose around __________ pounds per week. What do I need to know about calorie counting? In order to meet your daily calorie goal, you will need to:  Find out how many calories are in each food you would like to eat. Try to do this before you eat.  Decide how much of the food you plan to eat.  Write down what you ate and how many calories it had. Doing this is called keeping a food log. To successfully lose weight, it is important to balance calorie counting with a healthy lifestyle that includes regular activity. Aim for 150 minutes of moderate exercise (such as walking) or 75 minutes of vigorous exercise (such as running) each week. Where do I find calorie information?  The number of calories in a food can be found on a Nutrition Facts label. If a food does not have a Nutrition Facts label, try to look up the calories online or ask your dietitian for help. Remember that calories are listed per serving. If you choose to have more than one serving of a food, you will have to multiply the calories per serving by the amount of servings you plan to eat. For example,  the label on a package of bread might say that a serving size is 1 slice and that there are 90 calories in a serving. If you eat 1 slice, you will have eaten 90 calories. If you eat 2 slices, you will have eaten 180 calories. How do I keep a food log? Immediately after each meal, record the following information in your food log:  What you ate. Don't forget to include toppings, sauces, and other extras on the food.  How much you ate. This can be measured in cups, ounces, or number of items.  How many calories each food and drink had.  The total number of calories in the meal. Keep your food log near you, such as in a small notebook in your pocket, or use a mobile app or website. Some programs will calculate calories for you and show you how many calories you have left for the day to meet your goal. What are some  calorie counting tips?   Use your calories on foods and drinks that will fill you up and not leave you hungry: ? Some examples of foods that fill you up are nuts and nut butters, vegetables, lean proteins, and high-fiber foods like whole grains. High-fiber foods are foods with more than 5 g fiber per serving. ? Drinks such as sodas, specialty coffee drinks, alcohol, and juices have a lot of calories, yet do not fill you up.  Eat nutritious foods and avoid empty calories. Empty calories are calories you get from foods or beverages that do not have many vitamins or protein, such as candy, sweets, and soda. It is better to have a nutritious high-calorie food (such as an avocado) than a food with few nutrients (such as a bag of chips).  Know how many calories are in the foods you eat most often. This will help you calculate calorie counts faster.  Pay attention to calories in drinks. Low-calorie drinks include water and unsweetened drinks.  Pay attention to nutrition labels for "low fat" or "fat free" foods. These foods sometimes have the same amount of calories or more calories than the full fat versions. They also often have added sugar, starch, or salt, to make up for flavor that was removed with the fat.  Find a way of tracking calories that works for you. Get creative. Try different apps or programs if writing down calories does not work for you. What are some portion control tips?  Know how many calories are in a serving. This will help you know how many servings of a certain food you can have.  Use a measuring cup to measure serving sizes. You could also try weighing out portions on a kitchen scale. With time, you will be able to estimate serving sizes for some foods.  Take some time to put servings of different foods on your favorite plates, bowls, and cups so you know what a serving looks like.  Try not to eat straight from a bag or box. Doing this can lead to overeating. Put the amount  you would like to eat in a cup or on a plate to make sure you are eating the right portion.  Use smaller plates, glasses, and bowls to prevent overeating.  Try not to multitask (for example, watch TV or use your computer) while eating. If it is time to eat, sit down at a table and enjoy your food. This will help you to know when you are full. It will also help you to be  aware of what you are eating and how much you are eating. What are tips for following this plan? Reading food labels  Check the calorie count compared to the serving size. The serving size may be smaller than what you are used to eating.  Check the source of the calories. Make sure the food you are eating is high in vitamins and protein and low in saturated and trans fats. Shopping  Read nutrition labels while you shop. This will help you make healthy decisions before you decide to purchase your food.  Make a grocery list and stick to it. Cooking  Try to cook your favorite foods in a healthier way. For example, try baking instead of frying.  Use low-fat dairy products. Meal planning  Use more fruits and vegetables. Half of your plate should be fruits and vegetables.  Include lean proteins like poultry and fish. How do I count calories when eating out?  Ask for smaller portion sizes.  Consider sharing an entree and sides instead of getting your own entree.  If you get your own entree, eat only half. Ask for a box at the beginning of your meal and put the rest of your entree in it so you are not tempted to eat it.  If calories are listed on the menu, choose the lower calorie options.  Choose dishes that include vegetables, fruits, whole grains, low-fat dairy products, and lean protein.  Choose items that are boiled, broiled, grilled, or steamed. Stay away from items that are buttered, battered, fried, or served with cream sauce. Items labeled "crispy" are usually fried, unless stated otherwise.  Choose water,  low-fat milk, unsweetened iced tea, or other drinks without added sugar. If you want an alcoholic beverage, choose a lower calorie option such as a glass of wine or light beer.  Ask for dressings, sauces, and syrups on the side. These are usually high in calories, so you should limit the amount you eat.  If you want a salad, choose a garden salad and ask for grilled meats. Avoid extra toppings like bacon, cheese, or fried items. Ask for the dressing on the side, or ask for olive oil and vinegar or lemon to use as dressing.  Estimate how many servings of a food you are given. For example, a serving of cooked rice is  cup or about the size of half a baseball. Knowing serving sizes will help you be aware of how much food you are eating at restaurants. The list below tells you how big or small some common portion sizes are based on everyday objects: ? 1 oz--4 stacked dice. ? 3 oz--1 deck of cards. ? 1 tsp--1 die. ? 1 Tbsp-- a ping-pong ball. ? 2 Tbsp--1 ping-pong ball. ?  cup-- baseball. ? 1 cup--1 baseball. Summary  Calorie counting means keeping track of how many calories you eat and drink each day. If you eat fewer calories than your body needs, you should lose weight.  A healthy amount of weight to lose per week is usually 1-2 lb (0.5-0.9 kg). This usually means reducing your daily calorie intake by 500-750 calories.  The number of calories in a food can be found on a Nutrition Facts label. If a food does not have a Nutrition Facts label, try to look up the calories online or ask your dietitian for help.  Use your calories on foods and drinks that will fill you up, and not on foods and drinks that will leave you hungry.  Use  smaller plates, glasses, and bowls to prevent overeating. This information is not intended to replace advice given to you by your health care provider. Make sure you discuss any questions you have with your health care provider. Document Revised: 02/16/2018 Document  Reviewed: 04/29/2016 Elsevier Patient Education  2020 [employer].   Exercising to Lose Weight Exercise is structured, repetitive physical activity to improve fitness and health. Getting regular exercise is important for everyone. It is especially important if you are overweight. Being overweight increases your risk of heart disease, stroke, diabetes, high blood pressure, and several types of cancer. Reducing your calorie intake and exercising can help you lose weight. Exercise is usually categorized as moderate or vigorous intensity. To lose weight, most people need to do a certain amount of moderate-intensity or vigorous-intensity exercise each week. Moderate-intensity exercise  Moderate-intensity exercise is any activity that gets you moving enough to burn at least three times more energy (calories) than if you were sitting. Examples of moderate exercise include:  Walking a mile in 15 minutes.  Doing light yard work.  Biking at an easy pace. Most people should get at least 150 minutes (2 hours and 30 minutes) a week of moderate-intensity exercise to maintain their body weight. Vigorous-intensity exercise Vigorous-intensity exercise is any activity that gets you moving enough to burn at least six times more calories than if you were sitting. When you exercise at this intensity, you should be working hard enough that you are not able to carry on a conversation. Examples of vigorous exercise include:  Running.  Playing a team sport, such as football, basketball, and soccer.  Jumping rope. Most people should get at least 75 minutes (1 hour and 15 minutes) a week of vigorous-intensity exercise to maintain their body weight. How can exercise affect me? When you exercise enough to burn more calories than you eat, you lose weight. Exercise also reduces body fat and builds muscle. The more muscle you have, the more calories you burn. Exercise also:  Improves mood.  Reduces stress and  tension.  Improves your overall fitness, flexibility, and endurance.  Increases bone strength. The amount of exercise you need to lose weight depends on:  Your age.  The type of exercise.  Any health conditions you have.  Your overall physical ability. Talk to your health care provider about how much exercise you need and what types of activities are safe for you. What actions can I take to lose weight? Nutrition   Make changes to your diet as told by your health care provider or diet and nutrition specialist (dietitian). This may include: ? Eating fewer calories. ? Eating more protein. ? Eating less unhealthy fats. ? Eating a diet that includes fresh fruits and vegetables, whole grains, low-fat dairy products, and lean protein. ? Avoiding foods with added fat, salt, and sugar.  Drink plenty of water while you exercise to prevent dehydration or heat stroke. Activity  Choose an activity that you enjoy and set realistic goals. Your health care provider can help you make an exercise plan that works for you.  Exercise at a moderate or vigorous intensity most days of the week. ? The intensity of exercise may vary from person to person. You can tell how intense a workout is for you by paying attention to your breathing and heartbeat. Most people will notice their breathing and heartbeat get faster with more intense exercise.  Do resistance training twice each week, such as: ? Push-ups. ? Sit-ups. ? Lifting weights. ?  Using resistance bands.  Getting short amounts of exercise can be just as helpful as long structured periods of exercise. If you have trouble finding time to exercise, try to include exercise in your daily routine. ? Get up, stretch, and walk around every 30 minutes throughout the day. ? Go for a walk during your lunch break. ? Park your car farther away from your destination. ? If you take public transportation, get off one stop early and walk the rest of the  way. ? Make phone calls while standing up and walking around. ? Take the stairs instead of elevators or escalators.  Wear comfortable clothes and shoes with good support.  Do not exercise so much that you hurt yourself, feel dizzy, or get very short of breath. Where to find more information  [place] Department of Health and Human Services: [employer]  Centers for Disease Control and Prevention (CDC): http://www.wolf.info/ Contact a health care provider:  Before starting a new exercise program.  If you have questions or concerns about your weight.  If you have a medical problem that keeps you from exercising. Get help right away if you have any of the following while exercising:  Injury.  Dizziness.  Difficulty breathing or shortness of breath that does not go away when you stop exercising.  Chest pain.  Rapid heartbeat. Summary  Being overweight increases your risk of heart disease, stroke, diabetes, high blood pressure, and several types of cancer.  Losing weight happens when you burn more calories than you eat.  Reducing the amount of calories you eat in addition to getting regular moderate or vigorous exercise each week helps you lose weight. This information is not intended to replace advice given to you by your health care provider. Make sure you discuss any questions you have with your health care provider. Document Revised: 06/12/2017 Document Reviewed: 06/12/2017 Elsevier Patient Education  2020 [employer].

## 2020-02-24 NOTE — Telephone Encounter (Signed)
Spoke with patient regarding prior message.Advised patient she can use good RX and patient can by the office and pick that and the paperwork for patient assistance paper's. Patient stated she will stop by the office after her other appt. Patient's voice was understanding . Nothing else further needed.

## 2020-02-24 NOTE — Telephone Encounter (Signed)
Can you please send in [facility] for Symbicort 60mcg two puffs twice daily and Singulair 10mg  at bedtime to [facility] to help assist for all medications. She can apply for patient assistance with Symbicort.   Not urgent

## 2020-02-24 NOTE — Assessment & Plan Note (Addendum)
-   Pulmonary function testing today showed moderate restriction in lung capacity. There was no BD response and her diffusion capacity was normal This is likely from body habitus and/or underlying asthma. Her IGE and Eos absolute levels are elevated supporting possible underlying allergic asthma. Her cough did improve while taking BREO Ellipta 100. Unable to do FENO as we do not have working equipment in office today to check this.  - We will check with our pharmacist to see if theres a cheaper alternatives of ICS/LABA (patient is self pay) - Recommend adding Singulair 10mg  at bedtime  - Encourage patient work on weight loss aad increasing physically activity. Her goal weight is 200 lbs.

## 2020-02-24 NOTE — Progress Notes (Signed)
@Patient  ID: Kaitlyn Hays, female    DOB: 04/11/1981, 39 y.o.   MRN: [id number]  Chief Complaint  Patient presents with  . Follow-up    PFT performed today.  Pt states she has been having problems not being able to breathe at night and also has had complaints of chest tightness, productive cough with clear phlegm, and also has problems sneezing a lot. States the nasal drainage has been yellow.    Referring provider: [name], MD  HPI: 39 year old female, never smoked. PMH significant for iron deficiency anemia, chronic cough. Patient of Dr. Chase Hays, seen for initial consult on 12/30/19. Started on empiric BREO Ellipta 100. Ordered for Labs (Covid IgG, CBC with diff, blood IgE), CXR, PFTs,   Previous [facility] pulmonary encounter: 12/30/19- Dr. Chase Hays, consult  Kaitlyn Hays 39 y.o. -is a new consult.  She is here for chronic cough for the last 8 weeks.  She tells me is of insidious onset.  It is stable but severe.  It is waking her up at night and associated with wheezing.  There is also bilateral muscle pull in the infrascapular region because of the cough.  The cough is moderate to severe.  There is no fever.  There is also sinus congestion.  Prior to this she has never had cough there is no ACE inhibitor there is no acid reflux.  She does not have any known allergies other than in the springtime she does have some some sinus congestion that is mild.  But has never had cough or wheezing in the past.  She denies any viral infection in the past.  However she is not had any Covid vaccine.  This because she is scared of the Covid vaccine.  There is no vomiting with the cough.  No shortness of breath.  No orthopnea but she is waking up because of coughing.  No fever or chills or weight loss.  No diplopia.  No stroke symptoms.  She has urinary incontinence with coughing.  02/24/2020- Interim hx Patient presents today for 2 month follow-up for cough variant asthma with PFTs. She had second  hand smoke exposure as a child and adult. She reports having problem being able to breath at night. Associated chest tightness, productive cough with clear mucus and sneezing. Nasal drainage has been yellow. Started on Breo 100 at last visit. Her Eosinophils were 900 and IgE 91. Her original complaints was cough, this improved somewhat with low ICS/LABA sample. She has gained 30 lbs in the last year. Denies trouble swallowing, no muscle weakness.   Pulmonary function testing: FVC 2.52 (64%), FEV1 2.03 (63%), ratio 81, DLCO  Interpretation: Moderate restriction. No bronchodilatory response. Normal diffusion capacity.   Imaging:  01/02/20 CXR-  Clear lungs, no active cardiopulmonary process     Results for Kaitlyn Hays, Kaitlyn Hays (MRN [id number]) as of 12/30/2019 14:56  Ref. Range 08/19/2019 14:55 09/16/2019 14:59 09/16/2019 16:29 10/08/2019 09:13 12/30/19 15:27  Hemoglobin Latest Ref Range: 11.1 - 15.9 g/dL 8.6 (L)   10.6 (L) 11.3 (L)    Ref. Range 12/29/2008 17:01 07/08/2009 21:05 01/12/2015 15:52 12/30/19 15:27  Eosinophils Absolute Latest Ref Range: 0 - 0 K/uL 0.1 0.1 0.2 0.9   IgE    <OR=114 kU/L    91    No Known Allergies   There is no immunization history on file for this patient.  Past Medical History:  Diagnosis Date  . Anemia   . Asthma   . Fibroids   .  Gonorrhea   . Tibia fracture 08/07/2013    Tobacco History: Social History   Tobacco Use  Smoking Status Never Smoker  Smokeless Tobacco Never Used   Counseling given: Not Answered   Outpatient Medications Prior to Visit  Medication Sig Dispense Refill  . albuterol (VENTOLIN HFA) 108 (90 Base) MCG/ACT inhaler Inhale 1-2 puffs into the lungs every 6 (six) hours as needed for wheezing or shortness of breath. 8 g 0  . cetirizine (ZYRTEC ALLERGY) 10 MG tablet Take 1 tablet (10 mg total) by mouth daily. 30 tablet 2  . fluticasone (FLONASE) 50 MCG/ACT nasal spray Place 1 spray into both nostrils daily. 11.1 mL 2  . fluticasone  furoate-vilanterol (BREO ELLIPTA) 100-25 MCG/INH AEPB Inhale 1 puff into the lungs daily. 60 each 5  . Iron, Ferrous Sulfate, 325 (65 Fe) MG TABS Take 1 tablet by mouth 3 (three) times daily with meals. 90 tablet 0  . megestrol (MEGACE) 40 MG tablet Take two tablets three times daily for 3 days, then two tablets twice daily for 3 days, then one tablet twice daily (Patient taking differently: Take 80 mg by mouth daily. ) 90 tablet 3  . HYDROcodone-homatropine (HYCODAN) 5-1.5 MG/5ML syrup Take 5 mLs by mouth every 6 (six) hours as needed for cough. (Patient not taking: Reported on 02/24/2020) 120 mL 0  . fluticasone furoate-vilanterol (BREO ELLIPTA) 100-25 MCG/INH AEPB Inhale 1 puff into the lungs daily. 60 each 0  . predniSONE (DELTASONE) 20 MG tablet Take 2 tablets (40 mg total) by mouth daily. 10 tablet 0   No facility-administered medications prior to visit.   Review of Systems  Review of Systems  Constitutional: Negative.   Respiratory: Positive for cough and chest tightness. Negative for wheezing.        Nocturnal dyspnea   Physical Exam  BP 116/70 (BP Location: Left Arm, Cuff Size: Normal)   Pulse (!) 101   Temp 98.6 F (37 C) (Other (Comment)) Comment (Src): wrist  Ht 5\' 11"  (1.803 m)   Wt 229 lb 3.2 oz (104 kg)   SpO2 100%   BMI 31.97 kg/m  Physical Exam Constitutional:      General: She is not in acute distress.    Appearance: Normal appearance. She is obese. She is not ill-appearing.  HENT:     Mouth/Throat:     Mouth: Mucous membranes are moist.     Pharynx: Oropharynx is clear.  Cardiovascular:     Rate and Rhythm: Normal rate and regular rhythm.  Pulmonary:     Effort: Pulmonary effort is normal.     Breath sounds: Normal breath sounds. No stridor. No wheezing or rhonchi.  Abdominal:     General: There is distension.     Palpations: Abdomen is soft. There is no mass.     Tenderness: There is no abdominal tenderness.  Musculoskeletal:        General: Normal  range of motion.  Skin:    General: Skin is warm and dry.  Neurological:     General: No focal deficit present.     Mental Status: She is alert and oriented to person, place, and time. Mental status is at baseline.  Psychiatric:        Mood and Affect: Mood normal.        Behavior: Behavior normal.        Thought Content: Thought content normal.        Judgment: Judgment normal.      Lab Results:  CBC    Component Value Date/Time   WBC 6.4 12/30/2019 1527   RBC 5.17 (H) 12/30/2019 1527   HGB 11.3 (L) 12/30/2019 1527   HGB 10.6 (L) 10/08/2019 0913   HCT 35.7 (L) 12/30/2019 1527   HCT 36.3 10/08/2019 0913   PLT 288.0 12/30/2019 1527   PLT 428 10/08/2019 0913   MCV 69.1 (L) 12/30/2019 1527   MCV 72 (L) 10/08/2019 0913   MCH 20.9 (L) 10/08/2019 0913   MCH 16.2 (L) 01/12/2015 1552   MCHC 31.7 12/30/2019 1527   RDW 20.0 (H) 12/30/2019 1527   RDW 19.0 (H) 08/19/2019 1455   LYMPHSABS 1.2 12/30/2019 1527   MONOABS 0.9 12/30/2019 1527   EOSABS 0.6 12/30/2019 1527   BASOSABS 0.0 12/30/2019 1527    BMET    Component Value Date/Time   NA 141 11/19/2013 0338   K 3.5 (L) 11/19/2013 0338   CL 102 11/19/2013 0338   CO2 24 08/08/2013 0355   GLUCOSE 92 11/19/2013 0338   BUN 12 11/19/2013 0338   CREATININE 0.80 11/19/2013 0338   CALCIUM 9.3 08/08/2013 0355   GFRNONAA >90 08/08/2013 0355   GFRAA >90 08/08/2013 0355    BNP No results found for: BNP  ProBNP No results found for: PROBNP  Imaging: No results found.   Assessment & Plan:   Mild intermittent asthma - Pulmonary function testing today showed moderate restriction in lung capacity. There was no BD response and her diffusion capacity was normal This is likely from body habitus and/or underlying asthma. Her IGE and Eos absolute levels are elevated supporting possible underlying allergic asthma. Her cough did improve while taking BREO Ellipta 100. Unable to do FENO as we do not have working equipment in office today  to check this.  - We will check with our pharmacist to see if theres a cheaper alternatives of ICS/LABA (patient is self pay) - Recommend adding Singulair 10mg  at bedtime  - Encourage patient work on weight loss aad increasing physically activity. Her goal weight is 200 lbs.  Constipation - Recommend patient take colace stool softener once daily and increase fluid intake  GERD (gastroesophageal reflux disease) - Follow GERD diet, trial pepcid 20mg  over the counter if patient can afford      Martyn Ehrich, NP 02/24/2020

## 2020-02-24 NOTE — Telephone Encounter (Signed)
Patient can apply for patient assistance for the chosen inhaler. For Montelukast, patient could use Goodrx discount card, would be under $20/month.   Patient would benefit by filling through [facility] to assist for all meds, with her being uninsured.

## 2020-02-24 NOTE — Assessment & Plan Note (Signed)
-   Follow GERD diet, trial pepcid 20mg  over the counter if patient can afford

## 2020-02-24 NOTE — Assessment & Plan Note (Signed)
-   Recommend patient take colace stool softener once daily and increase fluid intake

## 2020-02-24 NOTE — Telephone Encounter (Signed)
Patient does not have insurance. Looking for her to be on low dose ICS/LABA and Montelukast  -[employer]

## 2020-02-24 NOTE — Progress Notes (Signed)
Full PFT completed today ? ?

## 2020-03-10 ENCOUNTER — Other Ambulatory Visit: Payer: Self-pay | Admitting: Obstetrics and Gynecology

## 2020-03-10 DIAGNOSIS — N938 Other specified abnormal uterine and vaginal bleeding: Secondary | ICD-10-CM

## 2020-03-11 ENCOUNTER — Other Ambulatory Visit: Payer: Self-pay | Admitting: Obstetrics and Gynecology

## 2020-03-11 ENCOUNTER — Telehealth (INDEPENDENT_AMBULATORY_CARE_PROVIDER_SITE_OTHER): Payer: HRSA Program | Admitting: *Deleted

## 2020-03-11 DIAGNOSIS — N938 Other specified abnormal uterine and vaginal bleeding: Secondary | ICD-10-CM

## 2020-03-11 MED ORDER — MEGESTROL ACETATE 40 MG PO TABS: ORAL_TABLET | ORAL | 3 refills | Status: DC

## 2020-03-11 NOTE — Telephone Encounter (Signed)
Pt left message requesting a call back from a nurse regarding meds and an appointment.

## 2020-03-11 NOTE — Telephone Encounter (Signed)
Returned patients call.   Patient reports she would like to reschedule her Hysterectomy. Message to [facility] and Dr. Rip Harbour. Per Drema Balzarine, patient will need to come in to see Dr. Rip Harbour and sign Hysterectomy Statement prior to scheduling.   Patient reports she attempted to refill her Megace and was informed that the office would need to approve.   Called pharmacy and they report she has no refills currently. Refills ordered per Dr. Jordan Hawks approval.   Called patient back to let her know that She will need to schedule appt with Dr. Rip Harbour to discuss setting up surgery and to sign Hysterectomy Statement. Message to front office to call patient to schedule appt with Dr. Rip Harbour to discuss Hysterectomy and have Hysterectomy statement signed.   Patient voiced understanding to all the above. Patient to call as needed.

## 2020-04-13 ENCOUNTER — Ambulatory Visit: Payer: Self-pay | Admitting: Obstetrics and Gynecology

## 2020-05-15 ENCOUNTER — Telehealth: Payer: Self-pay | Admitting: Primary Care

## 2020-05-15 MED ORDER — BUDESONIDE-FORMOTEROL FUMARATE 80-4.5 MCG/ACT IN AERO: 2.0000 | INHALATION_SPRAY | Freq: Two times a day (BID) | RESPIRATORY_TRACT | 12 refills | Status: DC

## 2020-05-15 NOTE — Telephone Encounter (Signed)
Received a fax from pt's pharmacy stating that budesonide/form 80/4.57mcg is not covered by pt's insurance plan.  The preferred alternatives are brand name symbicort, advair diskus, wixela.  I have sent Rx to pharmacy specifying for it to be brand name symbicort.

## 2020-07-14 ENCOUNTER — Other Ambulatory Visit: Payer: Self-pay | Admitting: Family Medicine

## 2020-07-14 DIAGNOSIS — Z1231 Encounter for screening mammogram for malignant neoplasm of breast: Secondary | ICD-10-CM

## 2020-08-24 ENCOUNTER — Other Ambulatory Visit: Payer: Self-pay | Admitting: Obstetrics & Gynecology

## 2020-08-24 DIAGNOSIS — N938 Other specified abnormal uterine and vaginal bleeding: Secondary | ICD-10-CM

## 2020-09-09 ENCOUNTER — Ambulatory Visit
Admission: RE | Admit: 2020-09-09 | Discharge: 2020-09-09 | Disposition: A | Discharge status: Home / Self Care | Payer: BC Managed Care – PPO | Source: Ambulatory Visit | Attending: Family Medicine | Admitting: Family Medicine

## 2020-09-09 ENCOUNTER — Other Ambulatory Visit: Payer: Self-pay

## 2020-09-09 DIAGNOSIS — Z1231 Encounter for screening mammogram for malignant neoplasm of breast: Secondary | ICD-10-CM

## 2020-09-21 ENCOUNTER — Other Ambulatory Visit: Payer: Self-pay | Admitting: Obstetrics & Gynecology

## 2020-09-21 DIAGNOSIS — N938 Other specified abnormal uterine and vaginal bleeding: Secondary | ICD-10-CM

## 2020-09-22 ENCOUNTER — Ambulatory Visit: Payer: Self-pay | Admitting: Internal Medicine

## 2020-09-23 ENCOUNTER — Telehealth: Payer: Self-pay

## 2020-09-23 NOTE — Telephone Encounter (Signed)
Pt called requesting a refill on her Megace.  I advised pt that she should see a provider as she has not been seen in about a year to f/u if she is going to continue Megace or get surgery.  Pt agreed.  [place] office notified to contact pt to schedule an appt with Dr. Rip Harbour.    Mel Almond, RN  09/23/20

## 2020-09-30 ENCOUNTER — Telehealth: Payer: Self-pay | Admitting: Lactation Services

## 2020-09-30 NOTE — Telephone Encounter (Signed)
Patient called and LM that she would like a nurse to call her back.   Returned patients call. Patient did not answer. LM for patient to call the office at her convenience or send in My Chart message with her concerns.

## 2020-10-15 ENCOUNTER — Ambulatory Visit: Payer: BC Managed Care – PPO | Admitting: Obstetrics and Gynecology

## 2020-10-15 ENCOUNTER — Other Ambulatory Visit: Payer: Self-pay

## 2020-10-15 ENCOUNTER — Emergency Department (HOSPITAL_COMMUNITY)
Admission: EM | Admit: 2020-10-15 | Discharge: 2020-10-15 | Disposition: A | Discharge status: Home / Self Care | Payer: BC Managed Care – PPO | Attending: Emergency Medicine | Admitting: Emergency Medicine

## 2020-10-15 ENCOUNTER — Encounter (HOSPITAL_COMMUNITY): Payer: Self-pay

## 2020-10-15 ENCOUNTER — Emergency Department (HOSPITAL_COMMUNITY): Payer: BC Managed Care – PPO

## 2020-10-15 DIAGNOSIS — Z7951 Long term (current) use of inhaled steroids: Secondary | ICD-10-CM | POA: Diagnosis not present

## 2020-10-15 DIAGNOSIS — Z20822 Contact with and (suspected) exposure to covid-19: Secondary | ICD-10-CM | POA: Diagnosis not present

## 2020-10-15 DIAGNOSIS — R0602 Shortness of breath: Secondary | ICD-10-CM | POA: Insufficient documentation

## 2020-10-15 DIAGNOSIS — R059 Cough, unspecified: Secondary | ICD-10-CM | POA: Insufficient documentation

## 2020-10-15 DIAGNOSIS — J3489 Other specified disorders of nose and nasal sinuses: Secondary | ICD-10-CM | POA: Insufficient documentation

## 2020-10-15 DIAGNOSIS — R0981 Nasal congestion: Secondary | ICD-10-CM | POA: Insufficient documentation

## 2020-10-15 DIAGNOSIS — J4520 Mild intermittent asthma, uncomplicated: Secondary | ICD-10-CM | POA: Insufficient documentation

## 2020-10-15 HISTORY — DX: Bronchitis, not specified as acute or chronic: J40

## 2020-10-15 LAB — RESP PANEL BY RT-PCR (FLU A&B, COVID) ARPGX2
Influenza A by PCR: NEGATIVE
Influenza B by PCR: NEGATIVE
SARS Coronavirus 2 by RT PCR: NEGATIVE

## 2020-10-15 MED ORDER — BENZONATATE 100 MG PO CAPS: 200.0000 mg | ORAL_CAPSULE | Freq: Three times a day (TID) | ORAL | 0 refills | Status: DC | PRN

## 2020-10-15 NOTE — ED Provider Notes (Signed)
[facility] DEPT Provider Note   CSN: [id number] Arrival date & time: 10/15/20  1158     History Chief Complaint  Patient presents with  . Cough    Kaitlyn Hays is a 40 y.o. female with a history asthma. Patient has been having a a cough for the past 3 weeks.  Cough has been getting progressively worse.  Cough is worse at night.  Cough is nonproductive with occasional production of mucus.  Last week lost smell and taste for one day and then resolved.  Patient endorses nasal congestion, rhinorrhea, shortness of breath.    Patient seen by her primary care provider on Tuesday.  Patient was started on prednisone and fluconazole.  Patient is scheduled to see her pulmonologist on 5/19.    Patient denies any fevers chills, sore throat, abdominal pain, nausea, pain, hemoptysis, surgery or traumatic injury and loss of release, hormone therapy, history of DVT or PE, unilateral leg swelling or tenderness.  HPI     Past Medical History:  Diagnosis Date  . Anemia   . Asthma   . Bronchitis   . Fibroids   . Gonorrhea   . Tibia fracture 08/07/2013    Patient Active Problem List   Diagnosis Date Noted  . Mild intermittent asthma 02/24/2020  . Constipation 02/24/2020  . GERD (gastroesophageal reflux disease) 02/24/2020  . Abnormal Pap smear of cervix 08/26/2019  . DUB (dysfunctional uterine bleeding) 01/12/2015  . Encounter for routine gynecological examination 11/29/2012  . Iron deficiency anemia 11/01/2011  . Fibroids 11/01/2011  . ANEMIA 07/08/2009    Past Surgical History:  Procedure Laterality Date  . NO PAST SURGERIES    . TIBIA IM NAIL INSERTION Left 08/07/2013   Procedure: INTRAMEDULLARY (IM) NAIL TIBIAL;  Surgeon: Mauri Pole, MD;  Location: [facility] ORS;  Service: Orthopedics;  Laterality: Left;     OB History    Gravida  0   Para  0   Term  0   Preterm  0   AB  0   Living  0     SAB  0   IAB  0   Ectopic  0   Multiple  0    Live Births              Family History  Problem Relation Age of Onset  . Hypertension Other     Social History   Tobacco Use  . Smoking status: Never Smoker  . Smokeless tobacco: Never Used  Vaping Use  . Vaping Use: Never used  Substance Use Topics  . Alcohol use: No  . Drug use: No    Home Medications Prior to Admission medications   Medication Sig Start Date End Date Taking? Authorizing Provider  albuterol (VENTOLIN HFA) 108 (90 Base) MCG/ACT inhaler Inhale 1-2 puffs into the lungs every 6 (six) hours as needed for wheezing or shortness of breath. 11/04/19   Wieters, Hallie C, PA-C  budesonide-formoterol (SYMBICORT) 80-4.5 MCG/ACT inhaler Inhale 2 puffs into the lungs in the morning and at bedtime. 05/15/20   Martyn Ehrich, NP  cetirizine (ZYRTEC ALLERGY) 10 MG tablet Take 1 tablet (10 mg total) by mouth daily. 01/02/20   Blanchie Dessert, MD  fluticasone (FLONASE) 50 MCG/ACT nasal spray Place 1 spray into both nostrils daily. 01/02/20   Blanchie Dessert, MD  fluticasone furoate-vilanterol (BREO ELLIPTA) 100-25 MCG/INH AEPB Inhale 1 puff into the lungs daily. 12/30/19   Brand Males, MD  HYDROcodone-homatropine [facility]) 5-1.5 MG/5ML  syrup Take 5 mLs by mouth every 6 (six) hours as needed for cough. Patient not taking: Reported on 02/24/2020 01/02/20   Blanchie Dessert, MD  Iron, Ferrous Sulfate, 325 (65 Fe) MG TABS Take 1 tablet by mouth 3 (three) times daily with meals. 08/21/19   [name], MD  megestrol (MEGACE) 40 MG tablet Take two tablets three times daily for 3 days, then two tablets twice daily for 3 days, then one tablet twice daily 03/11/20   Woodroe Mode, MD  montelukast (SINGULAIR) 10 MG tablet Take 1 tablet (10 mg total) by mouth at bedtime. 02/24/20   Martyn Ehrich, NP    Allergies    Patient has no known allergies.  Review of Systems   Review of Systems  Constitutional: Negative for chills and fever.  HENT: Positive for congestion  and rhinorrhea. Negative for sore throat.   Eyes: Negative for visual disturbance.  Respiratory: Positive for cough and shortness of breath. Negative for chest tightness.   Cardiovascular: Negative for chest pain, palpitations and leg swelling.  Gastrointestinal: Negative for abdominal pain, nausea and vomiting.  Musculoskeletal: Negative for back pain, myalgias and neck pain.  Skin: Negative for color change and rash.  Allergic/Immunologic: Negative for immunocompromised state.  Neurological: Negative for dizziness, syncope, light-headedness and headaches.  Psychiatric/Behavioral: Negative for confusion.    Physical Exam Updated Vital Signs BP (!) 142/89 (BP Location: Left Arm)   Pulse 99   Temp 98.9 F (37.2 C) (Oral)   Resp 18   Ht 5\' 11"  (1.803 m)   Wt 104.6 kg   LMP 10/07/2020   SpO2 99%   BMI 32.16 kg/m   Physical Exam Vitals and nursing note reviewed.  Constitutional:      General: She is not in acute distress.    Appearance: She is not ill-appearing, toxic-appearing or diaphoretic.  HENT:     Head: Normocephalic.  Eyes:     General: No scleral icterus.       Right eye: No discharge.        Left eye: No discharge.  Cardiovascular:     Rate and Rhythm: Normal rate.  Pulmonary:     Effort: Pulmonary effort is normal. No tachypnea, bradypnea or respiratory distress.     Breath sounds: Normal breath sounds. No stridor.  Abdominal:     General: There is no distension. There are no signs of injury.     Palpations: There is no mass or pulsatile mass.     Tenderness: There is no abdominal tenderness. There is no guarding or rebound.     Hernia: There is no hernia in the umbilical area or ventral area.  Musculoskeletal:     Right lower leg: No swelling, tenderness or bony tenderness. No edema.     Left lower leg: No swelling, tenderness or bony tenderness. No edema.  Skin:    General: Skin is warm and dry.  Neurological:     General: No focal deficit present.      Mental Status: She is alert.  Psychiatric:        Behavior: Behavior is cooperative.     ED Results / Procedures / Treatments   Labs (all labs ordered are listed, but only abnormal results are displayed) Labs Reviewed  RESP PANEL BY RT-PCR (FLU A&B, COVID) [id number]    EKG None  Radiology DG Chest 2 View  Result Date: 10/15/2020 CLINICAL DATA:  Cough EXAM: CHEST - 2 VIEW COMPARISON:  January 02, 2020 FINDINGS:  The lungs are clear. The heart size and pulmonary vascularity are normal. No adenopathy. No bone lesions. IMPRESSION: Lungs clear.  Cardiac silhouette normal. Electronically Signed   By: Lowella Grip III M.D.   On: 10/15/2020 13:12    Procedures Procedures   Medications Ordered in ED Medications - No data to display  ED Course  I have reviewed the triage vital signs and the nursing notes.  Pertinent labs & imaging results that were available during my care of the patient were reviewed by me and considered in my medical decision making (see chart for details).    MDM Rules/Calculators/A&P                          Alert 40 year old female in no acute distress, nontoxic-appearing.  Patient presents with chief complaint of cough and shortness of breath.  Patient reports her symptoms been present for the last 3 weeks.  Patient reports cough is nonproductive.  Patient reports history of asthma and similar symptoms in the past.  Patient was seen by her primary care provider on Tuesday started on fluconazole and prednisone for possible bronchitis.  Patient has been taking these medications as prescribed.  Patient has follow-up with her pulmonologist on May 19.  Chest x-ray shows no signs of pneumonia.  Patient is in no acute respiratory distress.  Lungs clear to auscultation bilaterally.  Low suspicion for PE as patient can be ruled out via [facility] criteria.  COVID 19 and influenza test pending.  Suspect that patient's symptoms are due to asthma exacerbation.  Discussed results,  findings, treatment and follow up. Patient advised of return precautions. Patient verbalized understanding and agreed with plan.  Kaitlyn Hays was evaluated in Emergency Department on 10/16/2020 for the symptoms described in the history of present illness. She was evaluated in the context of the global COVID-19 pandemic, which necessitated consideration that the patient might be at risk for infection with the SARS-CoV-2 virus that causes COVID-19. Institutional protocols and algorithms that pertain to the evaluation of patients at risk for COVID-19 are in a state of rapid change based on information released by regulatory bodies including the CDC and federal and state organizations. These policies and algorithms were followed during the patient's care in the ED.  Final Clinical Impression(s) / ED Diagnoses Final diagnoses:  Cough    Rx / DC Orders ED Discharge Orders         Ordered    benzonatate (TESSALON) 100 MG capsule  3 times daily PRN        10/15/20 [address] PA-C 10/16/20 0035    Daleen Bo, MD 10/16/20 [phone]

## 2020-10-15 NOTE — ED Triage Notes (Signed)
Patient c/o cough "For a long time." Patient states she saw her PCP 2 days ago and was given a breathing treatment` and states, "she didn't give me any prednisone.". Patient states, "I have a lot of problems with pollen and I have a history of bronchitis."

## 2020-10-15 NOTE — ED Provider Notes (Signed)
Emergency Medicine Provider Triage Evaluation Note  Kaitlyn Hays , a 40 y.o. female  was evaluated in triage.  Pt complains of cough x3 weeks, worse today.  Cough is producing white mucus.  Vaccinated for covid and influenza.    Review of Systems  Positive: Cough, nasal congestion, rhinorrhea, loss of smell and taste x 1day Negative: Fevers, chills,   Physical Exam  BP (!) 142/89 (BP Location: Left Arm)   Pulse 99   Temp 98.9 F (37.2 C) (Oral)   Resp 18   Ht 5\' 11"  (1.803 m)   Wt 104.6 kg   SpO2 99%   BMI 32.16 kg/m  Gen:   Awake, no distress   Resp:  Normal effort, lungs clear to auscultation bilaterally MSK:   Moves extremities without difficulty  Other:    Medical Decision Making  Medically screening exam initiated at 12:31 PM.  Appropriate orders placed.  Kaitlyn Hays was informed that the remainder of the evaluation will be completed by another provider, this initial triage assessment does not replace that evaluation, and the importance of remaining in the ED until their evaluation is complete.  The patient appears stable so that the remainder of the work up may be completed by another provider.      Loni Beckwith, PA-C 10/15/20 1235    Daleen Bo, MD 10/15/20 1558

## 2020-10-15 NOTE — Discharge Instructions (Addendum)
You came to the emergency department today with complaints of a cough.  You were tested for COVID-19.  These symptoms may be due to Covid 19 or another viral illness.  You have a COVID test pending. Please isolate at home while awaiting your results.  You can find your results and your  MyChart.  > If your test is negative, stay home until your fever has resolved/your symptoms are improving. > If your test is positive, isolate at home for at least 7 days after the day your symptoms initially began, and THEN at least 24 hours after you are fever-free without the help of medications (Tylenol/acetaminophen and Advil/ibuprofen/Motrin) AND your symptoms are improving.  You can alternate Tylenol/acetaminophen and Advil/ibuprofen/Motrin every 4 hours for sore throat, body aches, headache or fever.  Drink plenty of water.  Use saline nasal spray for congestion. You can take Tessalon every 8 hours as needed for cough. Wash your hands frequently. Please rest as needed with frequent repositioning and ambulation as tolerated.    If you use a CPAP or BiPAP device for management of obstructive sleep apnea may continue to use it however use it when isolated from other individuals to avoid spread of COVID-19.   If you use a nebulizer administer medication such as albuterol you may continue to use it however only one isolated from other individuals to avoid the spread of COVID-19.  If your symptoms do not improve please follow-up with your primary care provider or urgent care.  Return to the ER for significant shortness of breath, uncontrollable vomiting, severe chest pain, inability to tolerate fluids, changes in mental status such as confusion or other concerning symptoms.

## 2020-10-26 ENCOUNTER — Other Ambulatory Visit: Payer: Self-pay

## 2020-10-26 ENCOUNTER — Ambulatory Visit (INDEPENDENT_AMBULATORY_CARE_PROVIDER_SITE_OTHER): Payer: BC Managed Care – PPO | Admitting: Obstetrics and Gynecology

## 2020-10-26 ENCOUNTER — Encounter: Payer: Self-pay | Admitting: Obstetrics and Gynecology

## 2020-10-26 VITALS — BP 129/85 | HR 105 | Ht 71.0 in | Wt 233.6 lb

## 2020-10-26 DIAGNOSIS — R87610 Atypical squamous cells of undetermined significance on cytologic smear of cervix (ASC-US): Secondary | ICD-10-CM | POA: Diagnosis not present

## 2020-10-26 DIAGNOSIS — N938 Other specified abnormal uterine and vaginal bleeding: Secondary | ICD-10-CM | POA: Diagnosis not present

## 2020-10-26 DIAGNOSIS — D219 Benign neoplasm of connective and other soft tissue, unspecified: Secondary | ICD-10-CM | POA: Diagnosis not present

## 2020-10-26 MED ORDER — MEGESTROL ACETATE 40 MG PO TABS: 40.0000 mg | ORAL_TABLET | Freq: Two times a day (BID) | ORAL | 5 refills | Status: DC

## 2020-10-26 NOTE — Progress Notes (Signed)
Kaitlyn Hays presents for refill of her Megace and to discuss surgery. Pt with known H/O DUB related to uterine fibroids. Was to have surgery last year but canceled. Ran out of her Megace this past March DUB has returned.  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 All abnormal but she does not know specifics. Pathology report not in Epic. States she has a referral appt with an GYN here end of this month. (Review of appts does not show her having an appt in this office)  Still problems with asthma, but stable.  PE AF VSS Lungs clear Heart RRR Abd soft + BS enlarged uterus @ 18 weeks GU deferred  A/P DUB        Uterine fibroids        Abnormal pap smear.   Will restart Megace, 40 mg bid. Will have her sign for release of medical records for pap smears. Pt still desires surgery but started a new job this past November and will not have time until after this November. Will review pap smear results when medical records arrive. F/U per pap smear results and schedule surgery accordingly.

## 2020-10-26 NOTE — Patient Instructions (Signed)
Abdominal Hysterectomy, Care After The following information offers guidance on how to care for yourself after your procedure. Your doctor may also give you more specific instructions. If you have problems or questions, contact your doctor. What can I expect after the procedure? After the procedure, it is common to have:  Pain.  Tiredness.  No desire to eat.  Less interest in sex.  Bleeding and fluid (discharge) from your vagina. You may need to use a pad after this procedure.  Trouble pooping (constipation).  Feelings of sadness or other emotions. Follow these instructions at home: Medicines  Take over-the-counter and prescription medicines only as told by your doctor.  Do not take aspirin or NSAIDs, such as ibuprofen. These medicines can cause bleeding.  If you were prescribed an antibiotic medicine, take it as told by your doctor. Do not stop using the antibiotic even if you start to feel better.  If told, take steps to prevent problems with pooping (constipation). You may need to: ? Drink enough fluid to keep your pee (urine) pale yellow. ? Take medicines. You will be told what medicines to take. ? Eat foods that are high in fiber. These include beans, whole grains, and fresh fruits and vegetables. ? Limit foods that are high in fat and sugar. These include fried or sweet foods.  Ask your doctor if you should avoid driving or using machines while you are taking your medicine. Surgical cut care  Follow instructions from your doctor about how to take care of your cut from surgery (incision). Make sure you: ? Wash your hands with soap and water for at least 20 seconds before and after you change your bandage. If you cannot use soap and water, use hand sanitizer. ? Change your bandage. ? Leave stitches or skin glue in place for at least two weeks. ? Leave tape strips alone unless you are told to take them off. You may trim the edges of the tape strips if they curl up.  Keep  the bandage dry until your doctor says it can be taken off.  Check your incision every day for signs of infection. Check for: ? More redness, swelling, or pain. ? Fluid or blood. ? Warmth. ? Pus or a bad smell.   Activity  Rest as told by your doctor.  Get up to take short walks every 1 to 2 hours. Ask for help if you feel weak or unsteady.  Do not lift anything that is heavier than 10 lb (4.5 kg), or the limit that you are told.  Follow your doctor's advice about exercise, driving, and general activities.  Return to your normal activities when your doctor says that it is safe.   Lifestyle  Do not douche, use tampons, or have sex for at least 6 weeks or as told by your doctor.  Do not drink alcohol until your doctor says it is okay.  Do not smoke or use any products that contain nicotine or tobacco. These can delay healing after surgery. If you need help quitting, ask your doctor. General instructions  Do not take baths, swim, or use a hot tub. Ask your doctor about taking showers or sponge baths.  Try to have a responsible adult at home with you for the first 1-2 weeks to help with your daily chores.  Wear tight-fitting (compression) stockings as told by your doctor.  Keep all follow-up visits. Contact a doctor if:  You have chills or a fever.  You have any of these signs  of infection around your cut: ? More redness, swelling, or pain. ? Fluid or blood. ? Warmth. ? Pus or a bad smell.  Your cut breaks open.  You feel dizzy or light-headed.  You have pain or bleeding when you pee.  You keep having watery poop (diarrhea).  You keep feeling like you may vomit or you keep vomiting.  You have fluid coming from your vagina that is not normal.  You have any type of reaction to your medicine that is not normal, like a rash, or you develop an allergy to your medicine.  Your pain medicine does not help. Get help right away if:  You have a fever and your symptoms  get worse suddenly.  You have very bad pain in your belly (abdomen).  You are short of breath.  You faint.  You have pain, swelling, or redness of your leg.  You bleed a lot from your vagina and you see blood clots. Summary  It is normal to have some pain, tiredness, and fluid that comes from your vagina.  Do not take baths, swim, or use a hot tub. Ask your doctor about taking showers or sponge baths.  Do not lift anything that is heavier than 10 lb (4.5 kg), or the limit that you are told.  Follow your doctor's advice about exercise, driving, and general activities.  Try to have a responsible adult at home with you for the first 1-2 weeks to help with your daily chores. This information is not intended to replace advice given to you by your health care provider. Make sure you discuss any questions you have with your health care provider. Document Revised: 01/30/2020 Document Reviewed: 01/30/2020 Elsevier Patient Education  2021 [employer]. Hysterectomy Information  A hysterectomy is a surgery to take out the womb (uterus) or the womb and the lowest part of the womb (cervix), which opens into the vagina. The ovaries, the fallopian tubes, or both may also be taken out. After the procedure, a woman will no longer have menstrual periods and will not be able to get pregnant. What are the benefits? This surgery may improve your quality of life by relieving pain and heavy vaginal bleeding. This surgery may also:  Treat long-term (chronic) infection in the area between your hip bones (pelvis).  Treat conditions that affect the womb.  Treat cancer of the womb or cervix.  Lower the risk of cancer. It may also be done for transgender men to match their gender identity. What are the risks? Generally, this surgery is safe. But problems can happen, including:  Bleeding.  Needing donated blood (transfusion).  Blood clots.  Infection.  Damage to nearby parts or  organs.  Allergic reactions to medicines.  Needing to switch from a surgery that requires small cuts (incisions) to a surgery that requires a large cut. What are the different types? These are the three types:  The top part of the womb is taken out, but not the cervix.  The womb and cervix are taken out.  The womb, the cervix, and the tissue that holds the womb in place are taken out. What happens during the procedure? This surgery may be done in one of these ways:  A cut is made in the belly (abdomen). The womb is taken out through the opening.  A cut is made in the vagina. The womb is taken out through the opening that is made.  A device with a camera to help see is put through one  of 3 or 4 cuts made in the belly. The womb is taken out through the vagina.  A device with a camera to help see is put through one of 3 or 4 cuts made in the belly. The womb is cut into pieces and taken out through the openings or through the vagina.  A computer helps control the surgical tools put into 3 or 4 cuts made in the belly. The womb is cut into small pieces. The pieces are taken out through the openings or through the vagina. Talk with your doctor to see which is the right one for you. The procedures may vary among doctors and hospitals. What happens after the procedure?  You will be given pain medicine.  You may need to stay in the hospital for 1-2 days.  You may need to have a responsible adult stay with you for a few days after you go home.  If your ovaries were taken out, you may get hot flashes, have night sweats, and have trouble sleeping.  Talk with your doctor about how often you need Pap tests.  Keep all follow-up visits. Questions to ask your doctor  Is this surgery needed? What other options do I have?  What organs need to be taken out?  How long will I need to stay in the hospital?  How long will it take to get better at home?  What symptoms can I expect after the  procedure? Summary  A hysterectomy is a surgery to take out your womb.  This may be done to treat conditions.  After this surgery, you will not have periods and you cannot get pregnant.  There are different types of this surgery. Talk with your doctor about which is best for you.  This surgery is safe, but there are some risks. This information is not intended to replace advice given to you by your health care provider. Make sure you discuss any questions you have with your health care provider. Document Revised: 03/25/2020 Document Reviewed: 03/25/2020 Elsevier Patient Education  2021 [employer].

## 2020-10-29 ENCOUNTER — Encounter: Payer: Self-pay | Admitting: Internal Medicine

## 2020-10-29 ENCOUNTER — Other Ambulatory Visit: Payer: Self-pay

## 2020-10-29 ENCOUNTER — Ambulatory Visit (INDEPENDENT_AMBULATORY_CARE_PROVIDER_SITE_OTHER): Payer: BC Managed Care – PPO | Admitting: Internal Medicine

## 2020-10-29 VITALS — BP 120/78 | HR 116 | Ht 71.0 in | Wt 237.0 lb

## 2020-10-29 DIAGNOSIS — R062 Wheezing: Secondary | ICD-10-CM

## 2020-10-29 DIAGNOSIS — J4540 Moderate persistent asthma, uncomplicated: Secondary | ICD-10-CM | POA: Diagnosis not present

## 2020-10-29 DIAGNOSIS — R053 Chronic cough: Secondary | ICD-10-CM | POA: Diagnosis not present

## 2020-10-29 DIAGNOSIS — Z77098 Contact with and (suspected) exposure to other hazardous, chiefly nonmedicinal, chemicals: Secondary | ICD-10-CM | POA: Diagnosis not present

## 2020-10-29 DIAGNOSIS — Z862 Personal history of diseases of the blood and blood-forming organs and certain disorders involving the immune mechanism: Secondary | ICD-10-CM

## 2020-10-29 DIAGNOSIS — D721 Eosinophilia, unspecified: Secondary | ICD-10-CM

## 2020-10-29 NOTE — Progress Notes (Signed)
OV 12/30/2019  Subjective:  [name], female , DOB: [birth year] , age 40 y.o. , MRN: [id number] , ADDRESS: [address] [place] [address]  PCP [name], MD \ 12/30/2019 -   Chief Complaint  Patient presents with  . Consult     HPI Kaitlyn Hays 40 y.o. -is a new consult.  She is here for chronic cough for the last 8 weeks.  She tells me is of insidious onset.  It is stable but severe.  It is waking her up at night and associated with wheezing.  There is also bilateral muscle pull in the infrascapular region because of the cough.  The cough is moderate to severe.  There is no fever.  There is also sinus congestion.  Prior to this she has never had cough there is no ACE inhibitor there is no acid reflux.  She does not have any known allergies other than in the springtime she does have some some sinus congestion that is mild.  But has never had cough or wheezing in the past.  She denies any viral infection in the past.  However she is not had any Covid vaccine.  This because she is scared of the Covid vaccine.  There is no vomiting with the cough.  No shortness of breath.  No orthopnea but she is waking up because of coughing.  No fever or chills or weight loss.  No diplopia.  No stroke symptoms.  She has urinary incontinence with coughing.     Results for Kaitlyn Hays (MRN [id number]) as of 12/30/2019 14:56  Ref. Range 08/19/2019 14:55 09/16/2019 14:59 09/16/2019 16:29 10/08/2019 09:13  Hemoglobin Latest Ref Range: 11.1 - 15.9 g/dL 8.6 (L)   10.6 (L)    ROS - per HPI Results for Kaitlyn Hays (MRN [id number]) as of 12/30/2019 14:56  Ref. Range 12/29/2008 17:01 07/08/2009 21:05 01/12/2015 15:52  Eosinophils Absolute Latest Ref Range: 0 - 0 K/uL 0.1 0.1 0.2   CXR 2015  Narrative  CLINICAL DATA: Cough, shortness of breath.   EXAM:  CHEST 2 VIEW   COMPARISON: None.   FINDINGS:  The heart size and mediastinal contours are within normal limits.   Both lungs are clear. No pleural effusion or pneumothorax is noted.  The visualized skeletal structures are unremarkable.   IMPRESSION:  No active cardiopulmonary disease.    Electronically Signed  By: Sabino Dick M.D.  On: 06/27/2013 13:50      No Known Allergies   02/24/2020- Interim hx Patient presents today for 2 month follow-up for cough variant asthma with PFTs. She had second hand smoke exposure as a child and adult. She reports having problem being able to breath at night. Associated chest tightness, productive cough with clear mucus and sneezing. Nasal drainage has been yellow. Started on Breo 100 at last visit. Her Eosinophils were 900 and IgE 91. Her original complaints was cough, this improved somewhat with low ICS/LABA sample. She has gained 30 lbs in the last year. Denies trouble swallowing, no muscle weakness.   Pulmonary function testing: FVC 2.52 (64%), FEV1 2.03 (63%), ratio 81, DLCO  Interpretation: Moderate restriction. No bronchodilatory response. Normal diffusion capacity.   Imaging:  01/02/20 CXR-  Clear lungs, no active cardiopulmonary process     Results for Kaitlyn Hays (MRN [id number]) as of 12/30/2019 14:56  Ref. Range 08/19/2019 14:55 09/16/2019 14:59 09/16/2019 16:29 10/08/2019 09:13 12/30/19 15:27  Hemoglobin Latest Ref Range: 11.1 -  15.9 g/dL 8.6 (L)   10.6 (L) 11.3 (L)    Ref. Range 12/29/2008 17:01 07/08/2009 21:05 01/12/2015 15:52 12/30/19 15:27  Eosinophils Absolute Latest Ref Range: 0 - 0 K/uL 0.1 0.1 0.2 0.9   IgE    <OR=114 kU/L    91    No Known Allergies   OV 10/29/2020  Subjective:  [name], female , DOB: February 24, 1981 , age 40 y.o. , MRN: [id number] , ADDRESS: [address] [place] [address] PCP [name], MD Patient Care Team: [name], MD as PCP - General (Obstetrics and Gynecology)  This Provider for this visit: Treatment Team:  Attending Provider: Brand Males, MD    10/29/2020 -    Chief Complaint  Patient presents with  . Asthma    Hospital visit may 5 for asthma, given prednisone, coughing spells last hours  Asthma follow-up   HPI Kaitlyn Hays 40 y.o. -presents for asthma follow-up.  Last seen by me summer 2021.  After that in September 2021 saw nurse practitioner.  She says since then she is always having cough and waking up at night with chest tightness and wheezing.  She has intermittent cough.  She is on Advair HFA.  She not on Symbicort HFA as the med list indicates.  She takes Singulair.  Her compliance with the Advair HFA is less than 50%.  In the early part of May 2022 she had worsening exacerbation and was given prednisone by the PCP in the midst of prednisone for 5 days she ended up in the ER.  Review of the ER notes indicate she had a chest x-ray and this was clear.  She was sent home on [facility].  She finished the prednisone given by the PCP and since then the cough is improved but she is worried it will come back.  Review of the records indicate she has had history of anemia in the past.  She is high eosinophils.  Blood IgE is normal in the past.  She does not want to have a CT scan.  She is willing to have allergy profile checked.  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  She also is a part-time job at [employer] job.  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  Follow   Results for Kaitlyn Hays, Kaitlyn Hays (MRN [id number]) as of 10/29/2020 16:28  Ref. Range 12/30/2019 15:27  Hemoglobin Latest Ref Range: 12.0 - 15.0 g/dL 11.3 (L)  Results for Kaitlyn Hays (MRN [id number]) as of 10/29/2020 16:28  Ref. Range 11/19/2013 03:38 01/12/2015 15:52 08/19/2019 14:55 10/08/2019 09:13 12/30/2019 15:27  Eosinophils Absolute Latest Ref Range: 0.0 - 0.7 K/uL  0.2   0.6    PFT  PFT Results Latest Ref Rng & Units 02/24/2020  FVC-Pre L 2.70  FVC-Predicted Pre % 69  FVC-Post L 2.52  FVC-Predicted Post % 64  Pre FEV1/FVC % % 75  Post  FEV1/FCV % % 81  FEV1-Pre L 2.03  FEV1-Predicted Pre % 63  FEV1-Post L 2.03  DLCO uncorrected ml/min/mmHg 27.50  DLCO UNC% % 100  DLCO corrected ml/min/mmHg 27.50  DLCO COR %Predicted % 100  DLVA Predicted % 149  TLC L 4.62  TLC % Predicted % 75  RV % Predicted % 111   Results for PORSCHE, NOGUCHI (MRN [id number]) as of 10/29/2020 16:28  Ref. Range 11/19/2013 03:38 01/12/2015 15:52 08/19/2019 14:55 10/08/2019 09:13 12/30/2019 15:27  Eosinophils Absolute  Latest Ref Range: 0.0 - 0.7 K/uL  0.2   0.6      has a past medical history of Anemia, Asthma, Bronchitis, Fibroids, Gonorrhea, and Tibia fracture (08/07/2013).   reports that she has never smoked. She has never used smokeless tobacco.  Past Surgical History:  Procedure Laterality Date  . NO PAST SURGERIES    . TIBIA IM NAIL INSERTION Left 08/07/2013   Procedure: INTRAMEDULLARY (IM) NAIL TIBIAL;  Surgeon: Mauri Pole, MD;  Location: [facility];  Service: Orthopedics;  Laterality: Left;    No Known Allergies   There is no immunization history on file for this patient.  Family History  Problem Relation Age of Onset  . Hypertension Other      Current Outpatient Medications:  .  albuterol (VENTOLIN HFA) 108 (90 Base) MCG/ACT inhaler, Inhale 1-2 puffs into the lungs every 6 (six) hours as needed for wheezing or shortness of breath., Disp: 8 g, Rfl: 0 .  cetirizine (ZYRTEC ALLERGY) 10 MG tablet, Take 1 tablet (10 mg total) by mouth daily., Disp: 30 tablet, Rfl: 2 .  fluticasone (FLONASE) 50 MCG/ACT nasal spray, Place 1 spray into both nostrils daily., Disp: 11.1 mL, Rfl: 2 .  megestrol (MEGACE) 40 MG tablet, Take 1 tablet (40 mg total) by mouth 2 (two) times daily. Can increase to two tablets twice a day in the event of heavy bleeding, Disp: 60 tablet, Rfl: 5 .  montelukast (SINGULAIR) 10 MG tablet, Take 1 tablet (10 mg total) by mouth at bedtime., Disp: 30 tablet, Rfl: 11      Objective:   Vitals:   10/29/20 1549  BP: 120/78   Pulse: (!) 116  SpO2: 97%  Weight: 237 lb (107.5 kg)  Height: 5\' 11"  (1.803 m)    Estimated body mass index is 33.05 kg/m as calculated from the following:   Height as of this encounter: 5\' 11"  (1.803 m).   Weight as of this encounter: 237 lb (107.5 kg).  @WEIGHTCHANGE @  [employer]   10/29/20 1549  Weight: 237 lb (107.5 kg)     Physical Exam  General: No distress. Looks well Neuro: Alert and Oriented x 3. GCS 15. Speech normal Psych: Pleasant Resp:  Barrel Chest - no.  Wheeze - no, Crackles - no, No overt respiratory distress CVS: Normal heart sounds. Murmurs - no Ext: Stigmata of Connective Tissue Disease - no HEENT: Normal upper airway. PEERL +. No post nasal drip        Assessment:       ICD-10-CM   1. Chronic cough  R05.3 CBC with Differential/Platelet    Resp Allergy Profile Regn2DC DE MD [facility]    IgE    Pulmonary function test    Nitric oxide  2. Wheezing  R06.2   3. Moderate persistent asthma, unspecified whether complicated  T26.71   4. Exposure to chemical compounds  Z77.098   5. History of anemia  Z86.2   6. Eosinophilia, unspecified type  D72.10        Plan:     Patient Instructions     ICD-10-CM   1. Chronic cough  R05.3   2. Wheezing  R06.2   3. Moderate persistent asthma, unspecified whether complicated  I45.80   4. Exposure to chemical compounds  Z77.098   5. History of anemia  Z86.2   6. Eosinophilia, unspecified type  D72.10     The story sounds like asthma was not under full control Could be various reasons for  this -including allergic phenotype of work-related chemical exposure issues Lower compliance with Advair HFA can also be a problem  Plan - Do CBC with differential -Do blood IgE and RAST allergy profile -Do full pulmonary function test -Do FeNO or exhaled nitric oxide test -Meanwhile increase her compliance with Advair HFA 2 puff 2 times daily [CMA to ensure refills] -Continue Singulair as before daily at night -Try  to mask at work while being exposed to chemical fumes -Use albuterol for rescue  Follow-up - Return to see nurse practitioner in the next few weeks but after completing the above  -Based on the results consideration to be given for asthma biologic therapy      SIGNATURE    Dr. Brand Males, M.D., F.C.C.P,  Pulmonary and Critical Care Medicine Staff Physician, [place] Director - Interstitial Lung Disease  Program  [facility] [place] [facility] [place], [place], [address]  Pager: [phone], If no answer or between  15:00h - 7:00h: call [phone] Telephone: [phone]  4:50 PM 10/29/2020

## 2020-10-29 NOTE — Patient Instructions (Addendum)
ICD-10-CM   1. Chronic cough  R05.3   2. Wheezing  R06.2   3. Moderate persistent asthma, unspecified whether complicated  Q49.20   4. Exposure to chemical compounds  Z77.098   5. History of anemia  Z86.2   6. Eosinophilia, unspecified type  D72.10     The story sounds like asthma was not under full control Could be various reasons for this -including allergic phenotype of work-related chemical exposure issues Lower compliance with Advair HFA can also be a problem  Plan - Do CBC with differential -Do blood IgE and RAST allergy profile -Do full pulmonary function test -Do FeNO or exhaled nitric oxide test -Meanwhile increase her compliance with Advair HFA 2 puff 2 times daily [CMA to ensure refills] -Continue Singulair as before daily at night -Try to mask at work while being exposed to chemical fumes -Use albuterol for rescue  Follow-up - Return to see nurse practitioner in the next few weeks but after completing the above  -Based on the results consideration to be given for asthma biologic therapy

## 2020-11-23 ENCOUNTER — Other Ambulatory Visit (HOSPITAL_COMMUNITY)
Admission: RE | Admit: 2020-11-23 | Discharge: 2020-11-23 | Disposition: A | Discharge status: Home / Self Care | Payer: BC Managed Care – PPO | Source: Ambulatory Visit | Attending: Internal Medicine | Admitting: Internal Medicine

## 2020-11-23 DIAGNOSIS — Z20822 Contact with and (suspected) exposure to covid-19: Secondary | ICD-10-CM | POA: Insufficient documentation

## 2020-11-23 DIAGNOSIS — Z01812 Encounter for preprocedural laboratory examination: Secondary | ICD-10-CM | POA: Diagnosis present

## 2020-11-23 LAB — SARS CORONAVIRUS 2 (TAT 6-24 HRS): SARS Coronavirus 2: NEGATIVE

## 2020-11-25 ENCOUNTER — Ambulatory Visit (INDEPENDENT_AMBULATORY_CARE_PROVIDER_SITE_OTHER): Payer: BC Managed Care – PPO

## 2020-11-25 ENCOUNTER — Ambulatory Visit (INDEPENDENT_AMBULATORY_CARE_PROVIDER_SITE_OTHER): Payer: BC Managed Care – PPO | Admitting: Internal Medicine

## 2020-11-25 ENCOUNTER — Other Ambulatory Visit: Payer: Self-pay | Admitting: Acute Care

## 2020-11-25 ENCOUNTER — Other Ambulatory Visit: Payer: Self-pay

## 2020-11-25 ENCOUNTER — Ambulatory Visit (INDEPENDENT_AMBULATORY_CARE_PROVIDER_SITE_OTHER): Payer: BC Managed Care – PPO | Admitting: Acute Care

## 2020-11-25 ENCOUNTER — Encounter: Payer: Self-pay | Admitting: Acute Care

## 2020-11-25 VITALS — BP 128/84 | HR 88 | Temp 97.1°F | Ht 73.0 in | Wt 236.0 lb

## 2020-11-25 DIAGNOSIS — R0602 Shortness of breath: Secondary | ICD-10-CM

## 2020-11-25 DIAGNOSIS — R053 Chronic cough: Secondary | ICD-10-CM

## 2020-11-25 DIAGNOSIS — J4520 Mild intermittent asthma, uncomplicated: Secondary | ICD-10-CM

## 2020-11-25 DIAGNOSIS — R059 Cough, unspecified: Secondary | ICD-10-CM | POA: Diagnosis not present

## 2020-11-25 LAB — PULMONARY FUNCTION TEST
DL/VA % pred: 223 %
DL/VA: 9.4 ml/min/mmHg/L
DLCO cor % pred: 84 %
DLCO cor: 23.07 ml/min/mmHg
DLCO unc % pred: 84 %
DLCO unc: 23.07 ml/min/mmHg
FEF 25-75 Post: 1.2 L/sec
FEF 25-75 Pre: 1.63 L/sec
FEF2575-%Change-Post: -26 %
FEF2575-%Pred-Post: 36 %
FEF2575-%Pred-Pre: 49 %
FEV1-%Change-Post: -11 %
FEV1-%Pred-Post: 41 %
FEV1-%Pred-Pre: 46 %
FEV1-Post: 1.32 L
FEV1-Pre: 1.49 L
FEV1FVC-%Change-Post: -3 %
FEV1FVC-%Pred-Pre: 102 %
FEV6-%Change-Post: -8 %
FEV6-%Pred-Post: 41 %
FEV6-%Pred-Pre: 45 %
FEV6-Post: 1.61 L
FEV6-Pre: 1.75 L
FEV6FVC-%Change-Post: 0 %
FEV6FVC-%Pred-Post: 101 %
FEV6FVC-%Pred-Pre: 101 %
FVC-%Change-Post: -8 %
FVC-%Pred-Post: 41 %
FVC-%Pred-Pre: 44 %
FVC-Post: 1.61 L
FVC-Pre: 1.75 L
Post FEV1/FVC ratio: 82 %
Post FEV6/FVC ratio: 100 %
Pre FEV1/FVC ratio: 85 %
Pre FEV6/FVC Ratio: 100 %
RV % pred: 102 %
RV: 2 L
TLC % pred: 72 %
TLC: 4.4 L

## 2020-11-25 MED ORDER — PREDNISONE 10 MG PO TABS: 10.0000 mg | ORAL_TABLET | Freq: Every day | ORAL | 0 refills | Status: DC

## 2020-11-25 NOTE — Progress Notes (Signed)
Full PFT performed today. °

## 2020-11-25 NOTE — Patient Instructions (Addendum)
It is good to see you today. We will draw labs today. ( CBC with diff, blood IgE and RAST allergy profile) Feno today>> Unable to complete due to technique CXR today Add Zyrtec /or Allegra to your daily allergy regimen in the morning.  Start Flonase 1 squirt each nostril daily.  Continue using your Advair 2 puffs twice daily Rinse mouth after use Use your rescue inhaler with breakthrough shortness of breath or wheezing.  Continue Singulair every night  We will refer to healthy weight and wellness today. This will help to work on weight loss.  Prednisone taper; 10 mg tablets: 4 tabs x 2 days, 3 tabs x 2 days, 2 tabs x 2 days 1 tab x 2 days then stop.  Follow up in 2 weeks with Eustaquio Maize NP or Judson Roch NP. Please contact office for sooner follow up if symptoms do not improve or worsen or seek emergency care

## 2020-11-25 NOTE — Progress Notes (Signed)
History of Present Illness Kaitlyn Hays is a 40 y.o. female never smoker with cough variant asthma. She is followed by [name].   11/25/2020 Presents for follow up of cough variant asthma after PFT's ordered by [name]. She states her breathing is currently not good. She states her chest is tight. She is coughing . She states she does wheeze at intervals. She states she does have night time awakenings because of this. She states she was treated with a prednisone taper after she was seen 5/19. She has been off steroids since 5/28. She did not endorse any significant improvement in her breathing after steroid use. She states her breathing is worse outside in the heat and humidity. 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 When I asked her if she has used her albuterol inhaler, she told me she had not. Additionally I am unsure how compliant she is with her Advair. I think there is a significant need for education regarding compliance with maintenance medication and rescue medications, and when to use them. She states she is compliant with her Singulair at bedtime. PFT's were difficult for the patient. She had a very difficult time performing the maneuvers. She had poor technique. When we tried to get a FENO, she was unable to complete the breathing maneuvers. They appear to confirm restriction , as was noted in previous testing. F/F Ratio does not show obstruction at 85%. FVC and FEV1 are low at 44% and 46% respectively. TLC is 72%. DLCO is 84%. After speaking with the PFT Tech, I am unsure of how accurate these  these PFT's are as the patient had a very difficult time with technique.She denies any fever, chest pain or orthopnea. She is not wheezing today on exam, but she does endorse wheezing. She has not been using her rescue inhaler when she wheezes. Labs ordered at her 5/19 appointment with [name] were not drawn. We  will draw these today, and based on results consider biologic therapy.   Test Results: CXR 6/15 Pending  11/25/2020 Labs>> Pending CBC with diff Respiratory Allergy Profile IgE  6/15 PFT's >> Poor technique by patient Restriction with no obstruction and normal DLCO             CBC Latest Ref Rng & Units 12/30/2019 10/08/2019 08/19/2019  WBC 4.0 - 10.5 K/uL 6.4 7.9 10.5  Hemoglobin 12.0 - 15.0 g/dL 11.3(L) 10.6(L) 8.6(L)  Hematocrit 36.0 - 46.0 % 35.7(L) 36.3 31.5(L)  Platelets 150.0 - 400.0 K/uL 288.0 428 452(H)    BMP Latest Ref Rng & Units 11/19/2013 08/08/2013 08/07/2013  Glucose 70 - 99 mg/dL 92 130(H) 95  BUN 6 - 23 mg/dL 12 9 10   Creatinine 0.50 - 1.10 mg/dL 0.80 0.72 0.80  Sodium 137 - 147 mEq/L 141 137 139  Potassium 3.7 - 5.3 mEq/L 3.5(L) 4.1 4.0  Chloride 96 - 112 mEq/L 102 101 103  CO2 19 - 32 mEq/L - 24 25  Calcium 8.4 - 10.5 mg/dL - 9.3 9.7    BNP No results found for: BNP  ProBNP No results found for: PROBNP  PFT    Component Value Date/Time   FEV1PRE 1.49 11/25/2020 1351   FEV1POST 1.32 11/25/2020 1351   FVCPRE 1.75 11/25/2020 1351   FVCPOST 1.61 11/25/2020 1351   TLC 4.40 11/25/2020 1351   DLCOUNC 23.07 11/25/2020 1351   PREFEV1FVCRT 85 11/25/2020 1351   PSTFEV1FVCRT 82 11/25/2020  1351    No results found.   Past medical hx Past Medical History:  Diagnosis Date   Anemia    Asthma    Bronchitis    Fibroids    Gonorrhea    Tibia fracture 08/07/2013     Social History   Tobacco Use   Smoking status: Never   Smokeless tobacco: Never  Vaping Use   Vaping Use: Never used  Substance Use Topics   Alcohol use: No   Drug use: No    [name] reports that she has never smoked. She has never used smokeless tobacco. She reports that she does not drink alcohol and does not use drugs.  Tobacco Cessation: Never smoker   Past surgical hx, Family hx, Social hx all reviewed.  Current Outpatient Medications on File Prior to Visit   Medication Sig   ADVAIR HFA 115-21 MCG/ACT inhaler Inhale 2 puffs into the lungs 2 (two) times daily.   albuterol (VENTOLIN HFA) 108 (90 Base) MCG/ACT inhaler Inhale 1-2 puffs into the lungs every 6 (six) hours as needed for wheezing or shortness of breath.   cetirizine (ZYRTEC ALLERGY) 10 MG tablet Take 1 tablet (10 mg total) by mouth daily.   fluconazole (DIFLUCAN) 100 MG tablet Take 100 mg by mouth daily.   fluticasone (FLONASE) 50 MCG/ACT nasal spray Place 1 spray into both nostrils daily.   megestrol (MEGACE) 40 MG tablet Take 1 tablet (40 mg total) by mouth 2 (two) times daily. Can increase to two tablets twice a day in the event of heavy bleeding   montelukast (SINGULAIR) 10 MG tablet Take 1 tablet (10 mg total) by mouth at bedtime.   No current facility-administered medications on file prior to visit.     No Known Allergies  Review Of Systems:  Constitutional:   No  weight loss, night sweats,  Fevers, chills, + fatigue, or  lassitude.  HEENT:   No headaches,  Difficulty swallowing,  Tooth/dental problems, or  Sore throat,                No sneezing, itching, ear ache, nasal congestion, post nasal drip,   CV:  No chest pain, + chest tightness, Orthopnea, PND, swelling in lower extremities, anasarca, dizziness, palpitations, syncope.   GI  No heartburn, indigestion, abdominal pain, nausea, vomiting, diarrhea, change in bowel habits, loss of appetite, bloody stools.   Resp: + shortness of breath with exertion less at rest.  No excess mucus, no productive cough,  ++ non-productive cough,  No coughing up of blood.  No change in color of mucus.  + wheezing.  No chest wall deformity  Skin: no rash or lesions.  GU: no dysuria, change in color of urine, no urgency or frequency.  No flank pain, no hematuria   MS:  No joint pain or swelling.  No decreased range of motion.  No back pain.  Psych:  No change in mood or affect. No depression +  anxiety.  No memory loss.   Vital  Signs BP 128/84 (BP Location: Right Arm, Cuff Size: Normal)   Pulse 88   Temp (!) 97.1 F (36.2 C) (Oral)   Ht 6\' 1"  (1.854 m)   Wt 236 lb (107 kg)   SpO2 98%   BMI 31.14 kg/m    Physical Exam:  General- No distress,  A&Ox3, pleasant ENT: No sinus tenderness, TM clear, pale nasal mucosa, no oral exudate,no post nasal drip, no LAN Cardiac: S1, S2, regular rate and rhythm, no murmur  Chest: No wheeze/ rales/ dullness; no accessory muscle use, no nasal flaring, no sternal retractions Abd.: Soft Non-tender, ND, BS +, Body mass index is 31.14 kg/m. Ext: No clubbing cyanosis, edema Neuro:  normal strength, MAE x 4, A&O x 3 Skin: No rashes, warm and dry, NO lesions Psych: normal mood and behavior   Assessment/Plan  Poorly controlled cough variant asthma Questionable compliance with Advair Under use of rescue inhaler Plan We will draw labs today. ( CBC with diff, blood IgE and RAST allergy profile) Feno today>> Unable to complete due to technique CXR today Add Zyrtec /or Allegra to your daily allergy regimen in the morning.  Start Flonase 1 squirt each nostril daily.  Continue using your Advair 2 puffs twice daily Rinse mouth after use Use your rescue inhaler with breakthrough shortness of breath or wheezing.  Continue Singulair every night  We will refer to healthy weight and wellness today. This will help to work on weight loss.  Prednisone taper; 10 mg tablets: 4 tabs x 2 days, 3 tabs x 2 days, 2 tabs x 2 days 1 tab x 2 days then stop.  Try walking at [employer] at the indoor track since the heat makes your breathing worse.  Try using your rescue inhaler prior to walking to see if you experience less shortness of breath with activity Follow up in 2 weeks with Eustaquio Maize NP or Judson Roch NP to evaluate labs and determine best plan for effective asthma management. . Please contact office for sooner follow up if symptoms do not improve or worsen or seek emergency care    I spent 45  minutes  dedicated to the care of this patient on the date of this encounter to include pre-visit review of records, face-to-face time with the patient discussing conditions above, educating patient on maintenance vs rescue medications, post visit ordering of testing, clinical documentation with the electronic health record, making appropriate referrals as documented, and communicating necessary information to the patient's healthcare team.   Magdalen Spatz, NP 11/25/2020  3:32 PM

## 2020-11-25 NOTE — Patient Instructions (Signed)
Full PFT performed today. °

## 2020-11-26 ENCOUNTER — Encounter: Payer: Self-pay | Admitting: Acute Care

## 2020-11-26 ENCOUNTER — Other Ambulatory Visit (INDEPENDENT_AMBULATORY_CARE_PROVIDER_SITE_OTHER): Payer: BC Managed Care – PPO

## 2020-11-26 DIAGNOSIS — J4520 Mild intermittent asthma, uncomplicated: Secondary | ICD-10-CM

## 2020-11-26 LAB — CBC WITH DIFFERENTIAL/PLATELET
Basophils Absolute: 0 10*3/uL (ref 0.0–0.1)
Basophils Relative: 0.2 % (ref 0.0–3.0)
Eosinophils Absolute: 0 10*3/uL (ref 0.0–0.7)
Eosinophils Relative: 0 % (ref 0.0–5.0)
HCT: 43.6 % (ref 36.0–46.0)
Hemoglobin: 14.5 g/dL (ref 12.0–15.0)
Lymphocytes Relative: 5 % — ABNORMAL LOW (ref 12.0–46.0)
Lymphs Abs: 0.8 10*3/uL (ref 0.7–4.0)
MCHC: 33.3 g/dL (ref 30.0–36.0)
MCV: 81.6 fl (ref 78.0–100.0)
Monocytes Absolute: 0.7 10*3/uL (ref 0.1–1.0)
Monocytes Relative: 3.9 % (ref 3.0–12.0)
Neutro Abs: 15.3 10*3/uL — ABNORMAL HIGH (ref 1.4–7.7)
Neutrophils Relative %: 90.9 % — ABNORMAL HIGH (ref 43.0–77.0)
Platelets: 355 10*3/uL (ref 150.0–400.0)
RBC: 5.35 Mil/uL — ABNORMAL HIGH (ref 3.87–5.11)
RDW: 14.7 % (ref 11.5–15.5)
WBC: 16.9 10*3/uL — ABNORMAL HIGH (ref 4.0–10.5)

## 2020-11-26 LAB — RESPIRATORY ALLERGY PROFILE REGION II ~~LOC~~
Allergen, A. alternata, m6: <0.1 kU/L
Allergen, Cedar tree, t12: <0.1 kU/L
Allergen, Comm Silver Birch, t9: <0.1 kU/L
Allergen, Cottonwood, t14: <0.1 kU/L
Allergen, D pternoyssinus,d7: <0.1 kU/L
Allergen, Mouse Urine Protein, e78: <0.1 kU/L
Allergen, Mulberry, t76: <0.1 kU/L
Allergen, Oak,t7: <0.1 kU/L
Allergen, P. notatum, m1: <0.1 kU/L
Aspergillus fumigatus, m3: <0.1 kU/L
Bermuda Grass: <0.1 kU/L
Box Elder IgE: <0.1 kU/L
CLADOSPORIUM HERBARUM (M2) IGE: <0.1 kU/L
COMMON RAGWEED (SHORT) (W1) IGE: <0.1 kU/L
Cat Dander: <0.1 kU/L
Class: 0
Class: 0
Class: 0
Class: 0
Class: 0
Class: 0
Class: 0
Class: 0
Class: 0
Class: 0
Class: 0
Class: 0
Class: 0
Class: 0
Class: 0
Class: 0
Class: 0
Class: 0
Class: 0
Class: 0
Class: 0
Class: 0
Class: 0
Class: 0
Cockroach: <0.1 kU/L
D. farinae: <0.1 kU/L
Dog Dander: <0.1 kU/L
Elm IgE: <0.1 kU/L
IgE (Immunoglobulin E), Serum: 124 kU/L — ABNORMAL HIGH (ref <=114)
Johnson Grass: <0.1 kU/L
Pecan/Hickory Tree IgE: <0.1 kU/L
Rough Pigweed  IgE: <0.1 kU/L
Sheep Sorrel IgE: <0.1 kU/L
Timothy Grass: <0.1 kU/L

## 2020-11-26 LAB — INTERPRETATION:

## 2020-11-26 NOTE — Addendum Note (Signed)
Addended by: Suzzanne Cloud E on: 11/26/2020 12:33 PM   Modules accepted: Orders

## 2020-12-04 NOTE — Progress Notes (Signed)
Please let patient know CXR was normal. Thanks

## 2020-12-06 NOTE — Progress Notes (Signed)
Ige slightly high but rest of allergyu [panel negative. Sees Volanda Napoleon early July

## 2020-12-08 NOTE — Progress Notes (Signed)
Please call patient and see if she is better after treatment with prednisone.  Her WBC is elevated, which we could see in a flare. She has follow up with [facility] 7/1, but I would like to know how she is doing. Thanks

## 2020-12-09 ENCOUNTER — Ambulatory Visit: Payer: BC Managed Care – PPO | Admitting: Acute Care

## 2020-12-11 ENCOUNTER — Ambulatory Visit: Payer: BC Managed Care – PPO | Admitting: Primary Care

## 2020-12-17 ENCOUNTER — Telehealth: Payer: Self-pay | Admitting: Acute Care

## 2020-12-17 NOTE — Progress Notes (Signed)
Please call patient and see if she needs to be seen prior to her 7/20 appointment. Thanks

## 2020-12-17 NOTE — Telephone Encounter (Signed)
-----   Message from Magdalen Spatz, NP sent at 12/17/2020 12:42 PM EDT ----- Please call patient and see if she needs to be seen prior to her 7/20 appointment. Thanks

## 2020-12-17 NOTE — Telephone Encounter (Signed)
I called and spoke with patient who stated she is feeling better and will keep 12/30/20 appt.  Informed patient to call if anything changes, nothing further needed.

## 2020-12-30 ENCOUNTER — Ambulatory Visit: Payer: BC Managed Care – PPO | Admitting: Acute Care

## 2021-01-04 ENCOUNTER — Ambulatory Visit: Payer: BC Managed Care – PPO

## 2021-01-18 ENCOUNTER — Ambulatory Visit: Payer: BC Managed Care – PPO | Admitting: Primary Care

## 2021-01-18 ENCOUNTER — Ambulatory Visit: Payer: BC Managed Care – PPO

## 2021-01-27 ENCOUNTER — Ambulatory Visit (INDEPENDENT_AMBULATORY_CARE_PROVIDER_SITE_OTHER): Payer: BC Managed Care – PPO

## 2021-01-27 ENCOUNTER — Other Ambulatory Visit (HOSPITAL_COMMUNITY)
Admission: RE | Admit: 2021-01-27 | Discharge: 2021-01-27 | Disposition: A | Discharge status: Home / Self Care | Payer: BC Managed Care – PPO | Source: Ambulatory Visit | Attending: Family Medicine | Admitting: Family Medicine

## 2021-01-27 ENCOUNTER — Other Ambulatory Visit: Payer: Self-pay

## 2021-01-27 VITALS — Ht 71.0 in | Wt 241.2 lb

## 2021-01-27 DIAGNOSIS — N898 Other specified noninflammatory disorders of vagina: Secondary | ICD-10-CM

## 2021-01-27 NOTE — Progress Notes (Signed)
Agree with A & P. 

## 2021-01-27 NOTE — Progress Notes (Signed)
Pt here today for vaginal discharge that is clear , but having irritation x 3 months. Pt was last sexually active 2 months ago. Not known for STD exposure. Denies abd pain, vaginal itching or odor.  Self swab collected today. Pt advised results will take 24-48 hours and will see results in mychart and will be notified if needs further treatment. Pt verbalized understanding.   Pt also asking about making appt with Dr Rip Harbour for hysterectomy consult. Will make appt with front office at checkout.   Colletta Maryland, RN

## 2021-01-28 LAB — CERVICOVAGINAL ANCILLARY ONLY
Bacterial Vaginitis (gardnerella): POSITIVE — AB
Candida Glabrata: NEGATIVE
Candida Vaginitis: NEGATIVE
Chlamydia: NEGATIVE
Comment: NEGATIVE
Comment: NEGATIVE
Comment: NEGATIVE
Comment: NEGATIVE
Comment: NEGATIVE
Comment: NORMAL
Neisseria Gonorrhea: NEGATIVE
Trichomonas: NEGATIVE

## 2021-02-01 ENCOUNTER — Other Ambulatory Visit: Payer: Self-pay

## 2021-02-01 DIAGNOSIS — B9689 Other specified bacterial agents as the cause of diseases classified elsewhere: Secondary | ICD-10-CM

## 2021-02-01 MED ORDER — METRONIDAZOLE 500 MG PO TABS: 500.0000 mg | ORAL_TABLET | Freq: Two times a day (BID) | ORAL | 0 refills | Status: DC

## 2021-03-01 ENCOUNTER — Encounter: Payer: Self-pay | Admitting: Primary Care

## 2021-03-01 ENCOUNTER — Ambulatory Visit (INDEPENDENT_AMBULATORY_CARE_PROVIDER_SITE_OTHER): Payer: BC Managed Care – PPO | Admitting: Primary Care

## 2021-03-01 ENCOUNTER — Other Ambulatory Visit: Payer: Self-pay | Admitting: Primary Care

## 2021-03-01 ENCOUNTER — Other Ambulatory Visit: Payer: Self-pay

## 2021-03-01 VITALS — BP 110/74 | HR 110 | Temp 98.3°F | Ht 71.0 in | Wt 241.6 lb

## 2021-03-01 DIAGNOSIS — D721 Eosinophilia, unspecified: Secondary | ICD-10-CM | POA: Diagnosis not present

## 2021-03-01 DIAGNOSIS — J4541 Moderate persistent asthma with (acute) exacerbation: Secondary | ICD-10-CM

## 2021-03-01 DIAGNOSIS — J4540 Moderate persistent asthma, uncomplicated: Secondary | ICD-10-CM | POA: Diagnosis not present

## 2021-03-01 DIAGNOSIS — J45909 Unspecified asthma, uncomplicated: Secondary | ICD-10-CM | POA: Insufficient documentation

## 2021-03-01 LAB — NITRIC OXIDE: Nitric Oxide: 80

## 2021-03-01 MED ORDER — PREDNISONE 10 MG PO TABS: ORAL_TABLET | ORAL | 0 refills | Status: DC

## 2021-03-01 MED ORDER — ALBUTEROL SULFATE (2.5 MG/3ML) 0.083% IN NEBU: 2.5000 mg | INHALATION_SOLUTION | Freq: Four times a day (QID) | RESPIRATORY_TRACT | 3 refills | Status: DC | PRN

## 2021-03-01 MED ORDER — ALBUTEROL SULFATE (2.5 MG/3ML) 0.083% IN NEBU
2.5000 mg | INHALATION_SOLUTION | Freq: Once | RESPIRATORY_TRACT | Status: AC
  Administered 2021-03-01: 2.5 mg via RESPIRATORY_TRACT

## 2021-03-01 MED ORDER — ADVAIR HFA 230-21 MCG/ACT IN AERO: 2.0000 | INHALATION_SPRAY | Freq: Two times a day (BID) | RESPIRATORY_TRACT | 12 refills | Status: DC

## 2021-03-01 NOTE — Addendum Note (Signed)
Addended by: Dessie Coma on: 03/01/2021 05:00 PM   Modules accepted: Orders

## 2021-03-01 NOTE — Progress Notes (Signed)
@Patient  ID: Kaitlyn Hays, female    DOB: 1981/01/27, 40 y.o.   MRN: [id number]  Chief Complaint  Patient presents with   Follow-up    Patient reports she has thick white sputum x 2 weeks.     Referring provider: [name], MD  HPI: 40 year old female, never smoked. PMH significant for iron deficiency anemia, chronic cough. Patient of [name], seen for initial consult on 12/30/19.   Previous [facility] pulmonary encounter: 12/30/19- [name], consult  Kaitlyn Hays 40 y.o. -is a new consult.  She is here for chronic cough for the last 8 weeks.  She tells me is of insidious onset.  It is stable but severe.  It is waking her up at night and associated with wheezing.  There is also bilateral muscle pull in the infrascapular region because of the cough.  The cough is moderate to severe.  There is no fever.  There is also sinus congestion.  Prior to this she has never had cough there is no ACE inhibitor there is no acid reflux.  She does not have any known allergies other than in the springtime she does have some some sinus congestion that is mild.  But has never had cough or wheezing in the past.  She denies any viral infection in the past.  However she is not had any Covid vaccine.  This because she is scared of the Covid vaccine.  There is no vomiting with the cough.  No shortness of breath.  No orthopnea but she is waking up because of coughing.  No fever or chills or weight loss.  No diplopia.  No stroke symptoms.  She has urinary incontinence with coughing.  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 She had second hand smoke exposure as a child and adult. She reports having problem being able to breath at night. Associated chest tightness, productive cough with clear mucus and sneezing. Nasal drainage has been yellow. Started on Breo 100 at last visit. Her Eosinophils were 900 and IgE 91. Her original complaints was cough, this  improved somewhat with low ICS/LABA sample. She has gained 30 lbs in the last year. Denies trouble swallowing, no muscle weakness.   11/25/2020 Presents for follow up of cough variant asthma after PFT's ordered by [name]. She states her breathing is currently not good. She states her chest is tight. She is coughing . She states she does wheeze at intervals. She states she does have night time awakenings because of this. She states she was treated with a prednisone taper after she was seen 5/19. She has been off steroids since 5/28. She did not endorse any significant improvement in her breathing after steroid use. She states her breathing is worse outside in the heat and humidity. 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 When I asked her if she has used her albuterol inhaler, she told me she had not. Additionally I am unsure how compliant she is with her Advair. I think there is a significant need for education regarding compliance with maintenance medication and rescue medications, and when to use them. She states she is compliant with her Singulair at bedtime. PFT's were difficult for the patient. She had a very difficult time performing the maneuvers. She had poor technique. When we tried to get a FENO, she was unable to complete the breathing maneuvers.  They appear to confirm restriction , as was noted in previous testing. F/F Ratio does not show obstruction at 85%. FVC and FEV1 are low at 44% and 46% respectively. TLC is 72%. DLCO is 84%. After speaking with the PFT Tech, I am unsure of how accurate these  these PFT's are as the patient had a very difficult time with technique.She denies any fever, chest pain or orthopnea. She is not wheezing today on exam, but she does endorse wheezing. She has not been using her rescue inhaler when she wheezes. Labs ordered at her 5/19 appointment with [name] were not drawn. We will draw these  today, and based on results consider biologic therapy.     03/01/2021- Interim hx  Patient presents today for acute visit. Hx asthma. Patient developed cough 10 days ago. She has been tasking over the counter tylenol cold medicine. She is compliant Advair 115-70mcg 2 puff twice as day, Singulair, Flonase and zyrtec. She has been requiring albuterol rescue inhaler three times a day. She has been experiencing flare of her asthma symptoms every other month. She last was on oral prednisone in June 2022. IgE elevated 124.     Pulmonary function testing: FVC 2.52 (64%), FEV1 2.03 (63%), ratio 81, DLCO  Interpretation: Moderate restriction. No bronchodilatory response. Normal diffusion capacity.   Imaging:  01/02/20 CXR-  Clear lungs, no active cardiopulmonary process    No Known Allergies  Immunization History  Administered Date(s) Administered   PFIZER(Purple Top)SARS-COV-2 Vaccination 02/21/2020, 03/19/2020    Past Medical History:  Diagnosis Date   Anemia    Asthma    Bronchitis    Fibroids    Gonorrhea    Tibia fracture 08/07/2013    Tobacco History: Social History   Tobacco Use  Smoking Status Never  Smokeless Tobacco Never   Counseling given: Not Answered   Outpatient Medications Prior to Visit  Medication Sig Dispense Refill   albuterol (VENTOLIN HFA) 108 (90 Base) MCG/ACT inhaler Inhale 1-2 puffs into the lungs every 6 (six) hours as needed for wheezing or shortness of breath. 8 g 0   cetirizine (ZYRTEC ALLERGY) 10 MG tablet Take 1 tablet (10 mg total) by mouth daily. 30 tablet 2   fluticasone (FLONASE) 50 MCG/ACT nasal spray Place 1 spray into both nostrils daily. 11.1 mL 2   megestrol (MEGACE) 40 MG tablet Take 1 tablet (40 mg total) by mouth 2 (two) times daily. Can increase to two tablets twice a day in the event of heavy bleeding 60 tablet 5   montelukast (SINGULAIR) 10 MG tablet Take 10 mg by mouth at bedtime.     ADVAIR HFA 115-21 MCG/ACT inhaler Inhale 2 puffs  into the lungs 2 (two) times daily.     metroNIDAZOLE (FLAGYL) 500 MG tablet Take 1 tablet (500 mg total) by mouth 2 (two) times daily. (Patient not taking: Reported on 03/01/2021) 14 tablet 0   montelukast (SINGULAIR) 10 MG tablet Take 1 tablet (10 mg total) by mouth at bedtime. (Patient not taking: Reported on 03/01/2021) 30 tablet 11   No facility-administered medications prior to visit.    Review of Systems  Review of Systems  Constitutional: Negative.   HENT: Negative.    Respiratory:  Positive for cough and wheezing.   Cardiovascular: Negative.     Physical Exam  BP 110/74 (BP Location: Left Arm, Patient Position: Sitting, Cuff Size: Normal)   Pulse (!) 110   Temp 98.3 F (36.8 C) (Oral)   Ht 5\' 11"  (  1.803 m)   Wt 241 lb 9.6 oz (109.6 kg)   SpO2 99%   BMI 33.70 kg/m  Physical Exam Constitutional:      Appearance: Normal appearance.  HENT:     Head: Normocephalic and atraumatic.     Mouth/Throat:     Mouth: Mucous membranes are moist.     Pharynx: Oropharynx is clear.  Cardiovascular:     Rate and Rhythm: Normal rate and regular rhythm.  Pulmonary:     Effort: Pulmonary effort is normal.     Breath sounds: Wheezing present.  Musculoskeletal:        General: Normal range of motion.  Skin:    General: Skin is warm and dry.  Neurological:     General: No focal deficit present.     Mental Status: She is alert and oriented to person, place, and time. Mental status is at baseline.  Psychiatric:        Mood and Affect: Mood normal.        Behavior: Behavior normal.        Thought Content: Thought content normal.        Judgment: Judgment normal.     Lab Results:  CBC    Component Value Date/Time   WBC 16.9 (H) 11/26/2020 1410   RBC 5.35 (H) 11/26/2020 1410   HGB 14.5 11/26/2020 1410   HGB 10.6 (L) 10/08/2019 0913   HCT 43.6 11/26/2020 1410   HCT 36.3 10/08/2019 0913   PLT 355.0 11/26/2020 1410   PLT 428 10/08/2019 0913   MCV 81.6 11/26/2020 1410   MCV  72 (L) 10/08/2019 0913   MCH 20.9 (L) 10/08/2019 0913   MCH 16.2 (L) 01/12/2015 1552   MCHC 33.3 11/26/2020 1410   RDW 14.7 11/26/2020 1410   RDW 19.0 (H) 08/19/2019 1455   LYMPHSABS 0.8 11/26/2020 1410   MONOABS 0.7 11/26/2020 1410   EOSABS 0.0 11/26/2020 1410   BASOSABS 0.0 11/26/2020 1410    BMET    Component Value Date/Time   NA 141 11/19/2013 0338   K 3.5 (L) 11/19/2013 0338   CL 102 11/19/2013 0338   CO2 24 08/08/2013 0355   GLUCOSE 92 11/19/2013 0338   BUN 12 11/19/2013 0338   CREATININE 0.80 11/19/2013 0338   CALCIUM 9.3 08/08/2013 0355   GFRNONAA >90 08/08/2013 0355   GFRAA >90 08/08/2013 0355    BNP No results found for: BNP  ProBNP No results found for: PROBNP  Imaging: No results found.   Assessment & Plan:   Allergic asthma - Acute exacerbation of underlying asthma symptoms. She developed cough 10 days ago with associated wheezing. She is compliant with medium dose ICS/LABA. Last prescribed prednisone in June 2022. IgE elevated at 124. FENO today was 80. She was given albuterol nebulizer in office today. Plan sending in prednisone taper and increase Advair to 230-7mcg two puffs twice daily. Continue Singulair 10mg  at bedtime, Flonase nasal spray and Zyrtec. Consider biologic therapy. Referring to allergy.      Martyn Ehrich, NP 03/01/2021

## 2021-03-01 NOTE — Patient Instructions (Addendum)
Recommendations: Increase Advair 230-115- take two puffs morning and evening (rinse mouth after use) *prescription sent  Continue Albuterol rescue inhaler 2 puffs every 6 hours as needed for shortness of breath/wheezing or cough  Continue Flonase nasal spray, Singulair and Zyrtec   Orders: FENO RE: cough Albuterol nebulizer treatment x1 in office   Refer: Allergy re: eosinophilia/asthma (ordered)  RX: Prednisone taper as directed Albuterol nebulzier   Follow-up: 3 months with [name]

## 2021-03-01 NOTE — Assessment & Plan Note (Signed)
-   Acute exacerbation of underlying asthma symptoms. She developed cough 10 days ago with associated wheezing. She is compliant with medium dose ICS/LABA. Last prescribed prednisone in June 2022. IgE elevated at 124. FENO today was 80. She was given albuterol nebulizer in office today. Plan sending in prednisone taper and increase Advair to 230-44mcg two puffs twice daily. Continue Singulair 10mg  at bedtime, Flonase nasal spray and Zyrtec. Consider biologic therapy. Referring to allergy.

## 2021-03-29 ENCOUNTER — Telehealth: Payer: Self-pay | Admitting: Primary Care

## 2021-03-29 MED ORDER — PREDNISONE 10 MG PO TABS: ORAL_TABLET | ORAL | 0 refills | Status: DC

## 2021-03-29 MED ORDER — ALBUTEROL SULFATE HFA 108 (90 BASE) MCG/ACT IN AERS: 1.0000 | INHALATION_SPRAY | Freq: Four times a day (QID) | RESPIRATORY_TRACT | 5 refills | Status: DC | PRN

## 2021-03-29 NOTE — Telephone Encounter (Signed)
Called and spoke with pt letting her know recs stated by BW and she verbalized understanding. Rx for prednisone has been sent to preferred pharmacy for pt. Also refilled pt's rescue inhaler. Nothing further needed.

## 2021-03-29 NOTE — Telephone Encounter (Signed)
Primary Pulmonologist: Ramaswamy Last office visit and with whom: 03/01/21 with BW What do we see them for (pulmonary problems): asthma Last OV assessment/plan:  Patient Instructions by Martyn Ehrich, NP at 03/01/2021 3:00 PM  Author: Martyn Ehrich, NP Author Type: Nurse Practitioner Filed: 03/01/2021  3:40 PM  Note Status: Addendum Cosign: Cosign Not Required Encounter Date: 03/01/2021  Editor: Martyn Ehrich, NP (Nurse Practitioner)      Prior Versions: 1. Martyn Ehrich, NP (Nurse Practitioner) at 03/01/2021  3:40 PM - Addendum   2. Martyn Ehrich, NP (Nurse Practitioner) at 03/01/2021  3:27 PM - Addendum   3. Martyn Ehrich, NP (Nurse Practitioner) at 03/01/2021  3:24 PM - Addendum   4. Martyn Ehrich, NP (Nurse Practitioner) at 03/01/2021  3:23 PM - Addendum   5. Martyn Ehrich, NP (Nurse Practitioner) at 03/01/2021  3:23 PM - Signed  Recommendations: Increase Advair 230-115- take two puffs morning and evening (rinse mouth after use) *prescription sent  Continue Albuterol rescue inhaler 2 puffs every 6 hours as needed for shortness of breath/wheezing or cough  Continue Flonase nasal spray, Singulair and Zyrtec    Orders: FENO RE: cough Albuterol nebulizer treatment x1 in office    Refer: Allergy re: eosinophilia/asthma (ordered)   RX: Prednisone taper as directed Albuterol nebulzier    Follow-up: 3 months with [name]         Assessment & Plan Note by Martyn Ehrich, NP at 03/01/2021 3:35 PM  Author: Martyn Ehrich, NP Author Type: Nurse Practitioner Filed: 03/01/2021  3:39 PM  Note Status: Written Cosign: Cosign Not Required Encounter Date: 03/01/2021  Problem: Allergic asthma  Editor: Martyn Ehrich, NP (Nurse Practitioner)             - Acute exacerbation of underlying asthma symptoms. She developed cough 10 days ago with associated wheezing. She is compliant with medium dose ICS/LABA. Last prescribed prednisone in June 2022.  IgE elevated at 124. FENO today was 80. She was given albuterol nebulizer in office today. Plan sending in prednisone taper and increase Advair to 230-21mcg two puffs twice daily. Continue Singulair 10mg  at bedtime, Flonase nasal spray and Zyrtec. Consider biologic therapy. Referring to allergy.         Was appointment offered to patient (explain)?  Pt wants recommendations   Reason for call: Called and spoke with pt who stated she finished the prednisone at least a week ago and she states since finishing the prednisone, she is still having a lot of problems with her cough.  States that she is coughing so much, she is having chest discomfort. Pt states that none of the meds she is taking are helping with the cough.  Pt is using the Advair as prescribed, is having to use her rescue inhaler at least 4-5 times a day. Pt is also using the zyrtec, singulair, and flonase as prescribed.  Pt states that she has not received her nebulizer machine yet as she ordered it off line.  Pt states that she is coughing up white phlegm. States that she wheezes a lot.  Pt said the cough is bad at night but states that she does cough all throughout the day.  Asked pt if she had heard about the referral that was placed for her to see allergy and asthma and pt said she did hear about it but her appt is not until 11/16.  Pt is wanting to know if there is anything that  could be done to help with her symptoms, especially with the cough. Beth, please advise.   No Known Allergies  Immunization History  Administered Date(s) Administered   PFIZER(Purple Top)SARS-COV-2 Vaccination 02/21/2020, 03/19/2020

## 2021-03-29 NOTE — Telephone Encounter (Signed)
Please send in prednisone taper 40mg  x 3 days; 30mg  x 3 days; 20mg  x 3 days; 10mg  x 3 days. Continue Advair as prescribed and use rescue inhaler every 4-6 hours for breakthrough symptoms. She should takes delsym cough syrup over the counter twice a day. Make sure she is using flonase, Zyrtec and Singulair.

## 2021-04-28 ENCOUNTER — Ambulatory Visit: Payer: Self-pay | Admitting: Allergy

## 2021-05-07 ENCOUNTER — Other Ambulatory Visit: Payer: Self-pay | Admitting: Obstetrics and Gynecology

## 2021-05-07 DIAGNOSIS — N938 Other specified abnormal uterine and vaginal bleeding: Secondary | ICD-10-CM

## 2021-05-10 ENCOUNTER — Other Ambulatory Visit: Payer: Self-pay

## 2021-05-10 DIAGNOSIS — N938 Other specified abnormal uterine and vaginal bleeding: Secondary | ICD-10-CM

## 2021-05-10 MED ORDER — MEGESTROL ACETATE 40 MG PO TABS: 40.0000 mg | ORAL_TABLET | Freq: Two times a day (BID) | ORAL | 5 refills | Status: DC

## 2021-05-10 NOTE — Telephone Encounter (Signed)
Per Dr Rip Harbour. [place] for refill.  Colletta Maryland, RN

## 2021-05-14 ENCOUNTER — Encounter (HOSPITAL_COMMUNITY): Payer: Self-pay

## 2021-05-14 ENCOUNTER — Other Ambulatory Visit: Payer: Self-pay

## 2021-05-14 ENCOUNTER — Emergency Department (HOSPITAL_COMMUNITY)
Admission: EM | Admit: 2021-05-14 | Discharge: 2021-05-14 | Disposition: A | Discharge status: Home / Self Care | Payer: BC Managed Care – PPO | Attending: Emergency Medicine | Admitting: Emergency Medicine

## 2021-05-14 DIAGNOSIS — R053 Chronic cough: Secondary | ICD-10-CM | POA: Diagnosis present

## 2021-05-14 DIAGNOSIS — Z7951 Long term (current) use of inhaled steroids: Secondary | ICD-10-CM | POA: Diagnosis not present

## 2021-05-14 DIAGNOSIS — J4541 Moderate persistent asthma with (acute) exacerbation: Secondary | ICD-10-CM | POA: Diagnosis not present

## 2021-05-14 DIAGNOSIS — Z20822 Contact with and (suspected) exposure to covid-19: Secondary | ICD-10-CM | POA: Diagnosis not present

## 2021-05-14 LAB — RESP PANEL BY RT-PCR (FLU A&B, COVID) ARPGX2
Influenza A by PCR: NEGATIVE
Influenza B by PCR: NEGATIVE
SARS Coronavirus 2 by RT PCR: NEGATIVE

## 2021-05-14 MED ORDER — IPRATROPIUM-ALBUTEROL 0.5-2.5 (3) MG/3ML IN SOLN
3.0000 mL | Freq: Once | RESPIRATORY_TRACT | Status: AC
  Administered 2021-05-14: 3 mL via RESPIRATORY_TRACT
  Filled 2021-05-14: qty 3

## 2021-05-14 MED ORDER — BENZONATATE 100 MG PO CAPS: 100.0000 mg | ORAL_CAPSULE | Freq: Three times a day (TID) | ORAL | 0 refills | Status: AC

## 2021-05-14 MED ORDER — METHYLPREDNISOLONE SODIUM SUCC 125 MG IJ SOLR
125.0000 mg | Freq: Once | INTRAMUSCULAR | Status: AC
  Administered 2021-05-14: 125 mg via INTRAMUSCULAR

## 2021-05-14 MED ORDER — METHYLPREDNISOLONE SODIUM SUCC 125 MG IJ SOLR
125.0000 mg | Freq: Once | INTRAMUSCULAR | Status: DC
  Filled 2021-05-14: qty 2

## 2021-05-14 NOTE — ED Triage Notes (Signed)
Patient c/o a productive cough with white sputum x 1 week. Patient states she has asthma as well.

## 2021-05-14 NOTE — Discharge Instructions (Addendum)
I have sent you in a prescription for Tessalon which should help with your cough symptoms. Please follow up with your pulmonologist to adjust your medications. We gave you an injection of steroids as well as a breathing treatment today which should hopefully help you feel better soon.

## 2021-05-14 NOTE — ED Provider Notes (Signed)
[facility] DEPT Provider Note   CSN: [id number] Arrival date & time: 05/14/21  0731     History Chief Complaint  Patient presents with   Cough   Asthma    Kaitlyn Hays is a 40 y.o. female for evaluation of acute on chronic cough that has been worsening over the last week.  Patient has a history of moderate persistent asthma and bronchitis and she uses an albuterol and Singulair and Advair at home for her symptoms.  However over the last week, her cough is been worse, keeping her up at night.  Cough is largely nonproductive aside from occasional white phlegm.  She also endorses shortness of breath, headaches, and urinary incontinence when she coughs.  She denies fever, chills, abdominal pain, nausea, vomiting.   Cough Associated symptoms: headaches and shortness of breath   Associated symptoms: no fever and no rash   Asthma Associated symptoms include headaches and shortness of breath. Pertinent negatives include no abdominal pain.      Past Medical History:  Diagnosis Date   Anemia    Asthma    Bronchitis    Fibroids    Gonorrhea    Tibia fracture 08/07/2013    Patient Active Problem List   Diagnosis Date Noted   Allergic asthma 03/01/2021   Mild intermittent asthma 02/24/2020   Constipation 02/24/2020   GERD (gastroesophageal reflux disease) 02/24/2020   Abnormal Pap smear of cervix 08/26/2019   DUB (dysfunctional uterine bleeding) 01/12/2015   Encounter for routine gynecological examination 11/29/2012   Iron deficiency anemia 11/01/2011   Fibroids 11/01/2011   ANEMIA 07/08/2009    Past Surgical History:  Procedure Laterality Date   NO PAST SURGERIES     TIBIA IM NAIL INSERTION Left 08/07/2013   Procedure: INTRAMEDULLARY (IM) NAIL TIBIAL;  Surgeon: Mauri Pole, MD;  Location: [facility] ORS;  Service: Orthopedics;  Laterality: Left;     OB History     Gravida  0   Para  0   Term  0   Preterm  0   AB  0   Living  0       SAB  0   IAB  0   Ectopic  0   Multiple  0   Live Births              Family History  Problem Relation Age of Onset   Hypertension Other     Social History   Tobacco Use   Smoking status: Never   Smokeless tobacco: Never  Vaping Use   Vaping Use: Never used  Substance Use Topics   Alcohol use: No   Drug use: No    Home Medications Prior to Admission medications   Medication Sig Start Date End Date Taking? Authorizing Provider  albuterol (PROVENTIL) (2.5 MG/3ML) 0.083% nebulizer solution Take 3 mLs (2.5 mg total) by nebulization every 6 (six) hours as needed for wheezing or shortness of breath. 03/01/21   Martyn Ehrich, NP  albuterol (VENTOLIN HFA) 108 (90 Base) MCG/ACT inhaler Inhale 1-2 puffs into the lungs every 6 (six) hours as needed for wheezing or shortness of breath. 03/29/21   Martyn Ehrich, NP  cetirizine (ZYRTEC ALLERGY) 10 MG tablet Take 1 tablet (10 mg total) by mouth daily. 01/02/20   Blanchie Dessert, MD  fluticasone (FLONASE) 50 MCG/ACT nasal spray Place 1 spray into both nostrils daily. 01/02/20   Blanchie Dessert, MD  fluticasone-salmeterol (ADVAIR HFA) 230-21 MCG/ACT inhaler Inhale 2  puffs into the lungs 2 (two) times daily. 03/01/21   Martyn Ehrich, NP  megestrol (MEGACE) 40 MG tablet Take 1 tablet (40 mg total) by mouth 2 (two) times daily. Can increase to two tablets twice a day in the event of heavy bleeding 05/10/21   [name], MD  montelukast (SINGULAIR) 10 MG tablet TAKE 1 TABLET(10 MG) BY MOUTH AT BEDTIME 03/02/21   Martyn Ehrich, NP  predniSONE (DELTASONE) 10 MG tablet Take 4 tabs po daily x 3 days; then 3 tabs daily x3 days; then 2 tabs daily x3 days; then 1 tab daily x 3 days; then stop 03/29/21   Martyn Ehrich, NP    Allergies    Patient has no known allergies.  Review of Systems   Review of Systems  Constitutional:  Negative for fever.  HENT: Negative.    Eyes: Negative.   Respiratory:  Positive for  cough and shortness of breath.   Cardiovascular: Negative.   Gastrointestinal:  Negative for abdominal pain and vomiting.  Endocrine: Negative.   Genitourinary: Negative.   Musculoskeletal: Negative.   Skin:  Negative for rash.  Neurological:  Positive for headaches.  All other systems reviewed and are negative.  Physical Exam Updated Vital Signs BP (!) 162/106 (BP Location: Left Arm)   Pulse 100   Temp 97.9 F (36.6 C) (Oral)   Resp 20   Ht 5\' 11"  (1.803 m)   Wt 107 kg   SpO2 94%   BMI 32.92 kg/m   Physical Exam Vitals and nursing note reviewed.  Constitutional:      General: She is not in acute distress.    Appearance: She is ill-appearing.  HENT:     Head: Atraumatic.     Nose: Nose normal.  Eyes:     Extraocular Movements: Extraocular movements intact.     Conjunctiva/sclera: Conjunctivae normal.  Cardiovascular:     Rate and Rhythm: Normal rate and regular rhythm.     Pulses: Normal pulses.          Radial pulses are 2+ on the right side and 2+ on the left side.       Dorsalis pedis pulses are 2+ on the right side and 2+ on the left side.     Heart sounds: No murmur heard. Pulmonary:     Effort: Pulmonary effort is normal. No accessory muscle usage or respiratory distress.     Breath sounds: Wheezing present.     Comments: Expiratory wheezing the bilateral lung fields.  No acute distress, no accessory muscle usage. Abdominal:     General: Abdomen is flat. There is no distension.     Palpations: Abdomen is soft.     Tenderness: There is no abdominal tenderness.  Musculoskeletal:        General: Normal range of motion.     Cervical back: Normal range of motion.  Skin:    General: Skin is warm and dry.     Capillary Refill: Capillary refill takes less than 2 seconds.  Neurological:     General: No focal deficit present.     Mental Status: She is alert.  Psychiatric:        Mood and Affect: Mood normal.    ED Results / Procedures / Treatments    Labs (all labs ordered are listed, but only abnormal results are displayed) Labs Reviewed  RESP PANEL BY RT-PCR (FLU A&B, COVID) [id number]    EKG None  Radiology No results found.  Procedures Procedures   Medications Ordered in ED Medications  ipratropium-albuterol (DUONEB) 0.5-2.5 (3) MG/3ML nebulizer solution 3 mL (3 mLs Nebulization Given 05/14/21 0904)  methylPREDNISolone sodium succinate (SOLU-MEDROL) 125 mg/2 mL injection 125 mg (125 mg Intramuscular Given 05/14/21 0902)    ED Course  I have reviewed the triage vital signs and the nursing notes.  Pertinent labs & imaging results that were available during my care of the patient were reviewed by me and considered in my medical decision making (see chart for details).    MDM Rules/Calculators/A&P                         This is a 40 year old female with history significant for moderate persistent asthma as well as chronic cough and bronchitis that presents for evaluation of worsening cough, shortness of breath.  Symptoms are as described above.  Patient had diffuse expiratory wheezing in all lung fields.  Administered 125 mg Solu-Medrol with a DuoNeb treatment with some but not total relief.  There was some improvement in wheezing.  Patient is currently taking oral prednisone at home.  Currently right now her vitals are stable, oxygen 94% on room air.  She is afebrile.  Patient to discharge and follow-up with her pulmonologist for medication adjustments. With a prescription for Tessalon to hopefully help her sleep better at night.  Patient agrees is amenable to plan.  Discussed red flag symptoms and when to return.  Final Clinical Impression(s) / ED Diagnoses Final diagnoses:  Chronic cough  Moderate persistent asthma with exacerbation    Rx / DC Orders ED Discharge Orders          Ordered    benzonatate (TESSALON) 100 MG capsule  Every 8 hours        05/14/21 1026             Tonye Pearson, PA-C 05/14/21  [place], Maury, DO 05/14/21 1343

## 2021-05-26 ENCOUNTER — Ambulatory Visit: Payer: BC Managed Care – PPO | Admitting: Obstetrics and Gynecology

## 2021-05-27 ENCOUNTER — Telehealth: Payer: Self-pay | Admitting: Primary Care

## 2021-05-27 NOTE — Telephone Encounter (Signed)
Left message for patient to call back  

## 2021-05-31 NOTE — Telephone Encounter (Signed)
Called and spoke with pt about the reason for her call.   Asked pt if she was taking her Advair, cetirizine, montelukast as prescribed and pt said she is using all meds as prescribed but nothing is really working for her. Stated to pt that we needed to get her in for an appt to reevaluate to see if we might need to do anything different with her meds. Pt verbalized understanding and has been scheduled for an appt Friday, 12/13 with BW. Nothing further needed.

## 2021-06-04 ENCOUNTER — Encounter: Payer: Self-pay | Admitting: Primary Care

## 2021-06-04 ENCOUNTER — Other Ambulatory Visit: Payer: Self-pay

## 2021-06-04 ENCOUNTER — Ambulatory Visit (INDEPENDENT_AMBULATORY_CARE_PROVIDER_SITE_OTHER): Payer: BC Managed Care – PPO | Admitting: Primary Care

## 2021-06-04 DIAGNOSIS — R490 Dysphonia: Secondary | ICD-10-CM

## 2021-06-04 DIAGNOSIS — J309 Allergic rhinitis, unspecified: Secondary | ICD-10-CM | POA: Diagnosis not present

## 2021-06-04 DIAGNOSIS — J4541 Moderate persistent asthma with (acute) exacerbation: Secondary | ICD-10-CM

## 2021-06-04 MED ORDER — AZITHROMYCIN 250 MG PO TABS: ORAL_TABLET | ORAL | 0 refills | Status: DC

## 2021-06-04 MED ORDER — PROMETHAZINE-DM 6.25-15 MG/5ML PO SYRP: 5.0000 mL | ORAL_SOLUTION | Freq: Four times a day (QID) | ORAL | 0 refills | Status: DC | PRN

## 2021-06-04 MED ORDER — PREDNISONE 10 MG PO TABS: ORAL_TABLET | ORAL | 0 refills | Status: DC

## 2021-06-04 MED ORDER — ADVAIR HFA 230-21 MCG/ACT IN AERO: 2.0000 | INHALATION_SPRAY | Freq: Two times a day (BID) | RESPIRATORY_TRACT | 11 refills | Status: DC

## 2021-06-04 NOTE — Progress Notes (Signed)
@Patient  ID: Kaitlyn Hays, female    DOB: 1980-09-11, 40 y.o.   MRN: [id number]  Chief Complaint  Patient presents with   Follow-up    Patient still coughing a lot. Patient says her voice has been gone for a month.     Referring provider: No ref. provider found  HPI: 40 year old female, never smoked. PMH significant for iron deficiency anemia, chronic cough. Patient of [name], seen for initial consult on 12/30/19.   Previous [facility] pulmonary encounter: 12/30/19- [name], consult  Kaitlyn Hays 40 y.o. -is a new consult.  She is here for chronic cough for the last 8 weeks.  She tells me is of insidious onset.  It is stable but severe.  It is waking her up at night and associated with wheezing.  There is also bilateral muscle pull in the infrascapular region because of the cough.  The cough is moderate to severe.  There is no fever.  There is also sinus congestion.  Prior to this she has never had cough there is no ACE inhibitor there is no acid reflux.  She does not have any known allergies other than in the springtime she does have some some sinus congestion that is mild.  But has never had cough or wheezing in the past.  She denies any viral infection in the past.  However she is not had any Covid vaccine.  This because she is scared of the Covid vaccine.  There is no vomiting with the cough.  No shortness of breath.  No orthopnea but she is waking up because of coughing.  No fever or chills or weight loss.  No diplopia.  No stroke symptoms.  She has urinary incontinence with coughing.  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 She had second hand smoke exposure as a child and adult. She reports having problem being able to breath at night. Associated chest tightness, productive cough with clear mucus and sneezing. Nasal drainage has been yellow. Started on Breo 100 at last visit. Her Eosinophils were 900 and IgE 91. Her original  complaints was cough, this improved somewhat with low ICS/LABA sample. She has gained 30 lbs in the last year. Denies trouble swallowing, no muscle weakness.   11/25/2020 Presents for follow up of cough variant asthma after PFT's ordered by [name]. She states her breathing is currently not good. She states her chest is tight. She is coughing . She states she does wheeze at intervals. She states she does have night time awakenings because of this. She states she was treated with a prednisone taper after she was seen 5/19. She has been off steroids since 5/28. She did not endorse any significant improvement in her breathing after steroid use. She states her breathing is worse outside in the heat and humidity. 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 When I asked her if she has used her albuterol inhaler, she told me she had not. Additionally I am unsure how compliant she is with her Advair. I think there is a significant need for education regarding compliance with maintenance medication and rescue medications, and when to use them. She states she is compliant with her Singulair at bedtime. PFT's were difficult for the patient. She had a very difficult time performing the maneuvers. She had poor technique. When we tried to get a FENO, she was unable  to complete the breathing maneuvers. They appear to confirm restriction , as was noted in previous testing. F/F Ratio does not show obstruction at 85%. FVC and FEV1 are low at 44% and 46% respectively. TLC is 72%. DLCO is 84%. After speaking with the PFT Tech, I am unsure of how accurate these  these PFT's are as the patient had a very difficult time with technique.She denies any fever, chest pain or orthopnea. She is not wheezing today on exam, but she does endorse wheezing. She has not been using her rescue inhaler when she wheezes. Labs ordered at her 5/19 appointment with [name] were  not drawn. We will draw these today, and based on results consider biologic therapy.    03/01/2021 Patient presents today for acute visit. Hx asthma. Patient developed cough 10 days ago. She has been tasking over the counter tylenol cold medicine. She is compliant Advair 115-27mcg 2 puff twice as day, Singulair, Flonase and zyrtec. She has been requiring albuterol rescue inhaler three times a day. She has been experiencing flare of her asthma symptoms every other month. She last was on oral prednisone in June 2022. IgE elevated 124.   06/04/2021 - Interim hx  Patient presents today for acute OV/cough. Patient reports having chronic cough with associated voice hoarseness x1 month. Cough is mostly dry, occasionally productive with clear-cloudy mucus. She coughs after eating. She has a sore throat on the right side. She is compliant with Zyrtec and Singulair as prescribed. She went to ED on 05/14/21, received solumedrol. She has only been using albuterol (ventolin ) in the morning and proair in the evening. She is not currently on maintenance inhaler, she ran out of Advair inhaler two months ago. Denies f/c/s, heart burn or difficulty swallowing.    Pulmonary function testing: FVC 2.52 (64%), FEV1 2.03 (63%), ratio 81, DLCO  Interpretation: Moderate restriction. No bronchodilatory response. Normal diffusion capacity.   Imaging:  01/02/20 CXR-  Clear lungs, no active cardiopulmonary process   No Known Allergies  Immunization History  Administered Date(s) Administered   PFIZER(Purple Top)SARS-COV-2 Vaccination 02/21/2020, 03/19/2020    Past Medical History:  Diagnosis Date   Anemia    Asthma    Bronchitis    Fibroids    Gonorrhea    Tibia fracture 08/07/2013    Tobacco History: Social History   Tobacco Use  Smoking Status Never  Smokeless Tobacco Never   Counseling given: Not Answered   Outpatient Medications Prior to Visit  Medication Sig Dispense Refill   albuterol (PROVENTIL)  (2.5 MG/3ML) 0.083% nebulizer solution Take 3 mLs (2.5 mg total) by nebulization every 6 (six) hours as needed for wheezing or shortness of breath. 75 mL 3   albuterol (VENTOLIN HFA) 108 (90 Base) MCG/ACT inhaler Inhale 1-2 puffs into the lungs every 6 (six) hours as needed for wheezing or shortness of breath. 8 g 5   cetirizine (ZYRTEC ALLERGY) 10 MG tablet Take 1 tablet (10 mg total) by mouth daily. 30 tablet 2   fluticasone (FLONASE) 50 MCG/ACT nasal spray Place 1 spray into both nostrils daily. 11.1 mL 2   megestrol (MEGACE) 40 MG tablet Take 1 tablet (40 mg total) by mouth 2 (two) times daily. Can increase to two tablets twice a day in the event of heavy bleeding 60 tablet 5   montelukast (SINGULAIR) 10 MG tablet TAKE 1 TABLET(10 MG) BY MOUTH AT BEDTIME 30 tablet 11   predniSONE (DELTASONE) 10 MG tablet Take 4 tabs po daily x 3  days; then 3 tabs daily x3 days; then 2 tabs daily x3 days; then 1 tab daily x 3 days; then stop 30 tablet 0   fluticasone-salmeterol (ADVAIR HFA) 230-21 MCG/ACT inhaler Inhale 2 puffs into the lungs 2 (two) times daily. 1 each 12   No facility-administered medications prior to visit.   Review of Systems  Review of Systems  Constitutional: Negative.   HENT:  Positive for sore throat. Negative for trouble swallowing.   Respiratory:  Positive for cough.   Cardiovascular: Negative.     Physical Exam  BP (!) 138/92 (BP Location: Left Arm, Patient Position: Sitting, Cuff Size: Normal)    Pulse (!) 102    Temp 98.5 F (36.9 C) (Oral)    Ht 5\' 11"  (1.803 m)    Wt 246 lb (111.6 kg)    SpO2 98%    BMI 34.31 kg/m  Physical Exam Constitutional:      Appearance: Normal appearance.  HENT:     Head: Normocephalic and atraumatic.     Right Ear: Tympanic membrane normal. There is no impacted cerumen.     Left Ear: Tympanic membrane normal. There is no impacted cerumen.     Mouth/Throat:     Mouth: Mucous membranes are dry.     Tongue: No lesions.     Pharynx:  Oropharynx is clear. Uvula midline. Uvula swelling present. No oropharyngeal exudate.     Tonsils: No tonsillar exudate or tonsillar abscesses. 2+ on the right. 2+ on the left.  Cardiovascular:     Rate and Rhythm: Normal rate and regular rhythm.  Pulmonary:     Effort: Pulmonary effort is normal.     Breath sounds: Normal breath sounds. No wheezing, rhonchi or rales.  Musculoskeletal:        General: Normal range of motion.  Skin:    General: Skin is warm and dry.  Neurological:     General: No focal deficit present.     Mental Status: She is alert and oriented to person, place, and time. Mental status is at baseline.  Psychiatric:        Mood and Affect: Mood normal.        Behavior: Behavior normal.        Thought Content: Thought content normal.        Judgment: Judgment normal.     Lab Results:  CBC    Component Value Date/Time   WBC 16.9 (H) 11/26/2020 1410   RBC 5.35 (H) 11/26/2020 1410   HGB 14.5 11/26/2020 1410   HGB 10.6 (L) 10/08/2019 0913   HCT 43.6 11/26/2020 1410   HCT 36.3 10/08/2019 0913   PLT 355.0 11/26/2020 1410   PLT 428 10/08/2019 0913   MCV 81.6 11/26/2020 1410   MCV 72 (L) 10/08/2019 0913   MCH 20.9 (L) 10/08/2019 0913   MCH 16.2 (L) 01/12/2015 1552   MCHC 33.3 11/26/2020 1410   RDW 14.7 11/26/2020 1410   RDW 19.0 (H) 08/19/2019 1455   LYMPHSABS 0.8 11/26/2020 1410   MONOABS 0.7 11/26/2020 1410   EOSABS 0.0 11/26/2020 1410   BASOSABS 0.0 11/26/2020 1410    BMET    Component Value Date/Time   NA 141 11/19/2013 0338   K 3.5 (L) 11/19/2013 0338   CL 102 11/19/2013 0338   CO2 24 08/08/2013 0355   GLUCOSE 92 11/19/2013 0338   BUN 12 11/19/2013 0338   CREATININE 0.80 11/19/2013 0338   CALCIUM 9.3 08/08/2013 0355   GFRNONAA >90 08/08/2013 0355  GFRAA >90 08/08/2013 0355    BNP No results found for: BNP  ProBNP No results found for: PROBNP  Imaging: No results found.   Assessment & Plan:   Allergic asthma - Acute exacerbation.  Patient continues to have chronic cough symptoms, she is not currently on maintenance ICS/LABA inhaler. Advised she resume Advair 230-72mcg two puffs morning and evening and use albuterol rescue inhaler 2 puffs every 6 hours for breakthrough shortness of breath/wheezing. We are sending in [place] and prednisone 20mg  qd x 5 days for acute asthma exacerbation and URI/bronchitis symptoms. She can also take promethazine-DM q6hours as needed for cough. If cough continues may want to consider adding biologic therapy.   Allergic rhinitis -Recommend we optimize treatment for allergic rhinitis by resuming Zyrtec 10 my daily and continue montelukast 10mg  in the evening  Voice hoarseness - Likely related to acute URI symptoms, allergic rhinitis and uncontrolled asthma, if persistent needs ENT eval    Martyn Ehrich, NP 06/05/2021

## 2021-06-04 NOTE — Patient Instructions (Addendum)
Recommendations: Resume Advair 2 puffs morning and evening (take every single day as scheduled, rinse mouth after use) Use albuterol rescue inhaler 2 puffs every 6 hours for breakthrough shortness of breath/wheezing Resume Zyrtec 10 my daily  Continue montelukast 10mg  in the evening Take promethazine-DM 4 times a day as needed for cough  Rx: [facility]pack Prednisone taper  Follow-up: Call if symptoms do not improve in 1 week  Needs follow-up with [name] in 1-3 months

## 2021-06-05 DIAGNOSIS — R490 Dysphonia: Secondary | ICD-10-CM | POA: Insufficient documentation

## 2021-06-05 DIAGNOSIS — J309 Allergic rhinitis, unspecified: Secondary | ICD-10-CM | POA: Insufficient documentation

## 2021-06-05 NOTE — Assessment & Plan Note (Addendum)
-   Acute exacerbation. Patient continues to have chronic cough symptoms, she is not currently on maintenance ICS/LABA inhaler. Advised she resume Advair 230-38mcg two puffs morning and evening and use albuterol rescue inhaler 2 puffs every 6 hours for breakthrough shortness of breath/wheezing. We are sending in [place] and prednisone 20mg  qd x 5 days for acute asthma exacerbation and URI/bronchitis symptoms. She can also take promethazine-DM q6hours as needed for cough. If cough continues may want to consider adding biologic therapy.

## 2021-06-05 NOTE — Assessment & Plan Note (Signed)
-  Recommend we optimize treatment for allergic rhinitis by resuming Zyrtec 10 my daily and continue montelukast 10mg  in the evening

## 2021-06-05 NOTE — Assessment & Plan Note (Signed)
-   Likely related to acute URI symptoms, allergic rhinitis and uncontrolled asthma, if persistent needs ENT eval

## 2021-06-13 DIAGNOSIS — Z87410 Personal history of cervical dysplasia: Secondary | ICD-10-CM

## 2021-06-13 HISTORY — DX: Personal history of cervical dysplasia: Z87.410

## 2021-06-18 ENCOUNTER — Ambulatory Visit: Payer: BC Managed Care – PPO | Admitting: Obstetrics and Gynecology

## 2021-06-27 NOTE — Progress Notes (Deleted)
New Patient Note  RE: Kaitlyn Hays MRN: [id number] DOB: 11-Aug-1980 Date of Office Visit: 06/28/2021  Consult requested by: [name], MD Primary care provider: System, Provider Not In  Chief Complaint: No chief complaint on file.  History of Present Illness: I had the pleasure of seeing Kaitlyn Hays for initial evaluation at the [facility] on 06/27/2021. She is a 41 y.o. female, who is referred here by System, Provider Not In for the evaluation of asthma.  She reports symptoms of *** chest tightness, shortness of breath, coughing, wheezing, nocturnal awakenings for *** years. Current medications include *** which help. She reports *** using aerochamber with inhalers. She tried the following inhalers: ***. Main triggers are ***allergies, infections, weather changes, smoke, exercise, pet exposure. In the last month, frequency of symptoms: ***x/week. Frequency of nocturnal symptoms: ***x/month. Frequency of SABA use: ***x/week. Interference with physical activity: ***. Sleep is ***disturbed. In the last 12 months, emergency room visits/urgent care visits/doctor office visits or hospitalizations due to respiratory issues: ***. In the last 12 months, oral steroids courses: ***. Lifetime history of hospitalization for respiratory issues: ***. Prior intubations: ***. Asthma was diagnosed at age *** by ***. History of pneumonia: ***. She was evaluated by allergist ***pulmonologist in the past. Smoking exposure: ***. Up to date with flu vaccine: ***. Up to date with pneumonia vaccine: ***. Up to date with COVID-19 vaccine: ***. Prior Covid-19 infection: ***. History of reflux: ***.  03/01/2021 pulmonology visit: "- Acute exacerbation of underlying asthma symptoms. She developed cough 10 days ago with associated wheezing. She is compliant with medium dose ICS/LABA. Last prescribed prednisone in June 2022. IgE elevated at 124. FENO today was 80. She was given albuterol  nebulizer in office today. Plan sending in prednisone taper and increase Advair to 230-49mcg two puffs twice daily. Continue Singulair 10mg  at bedtime, Flonase nasal spray and Zyrtec. Consider biologic therapy. Referring to allergy."  Component     Latest Ref Rng & Units 11/25/2020         4:08 PM  Allergen, D pternoyssinus,d7     kU/L <0.10  Class      0  D. farinae     kU/L <0.10  Allergen, P. notatum, m1     kU/L <0.10  CLADOSPORIUM HERBARUM (M2) IGE     kU/L <0.10  Aspergillus fumigatus, m3     kU/L <0.10  Allergen, A. alternata, m6     kU/L <0.10  Cat Dander     kU/L <0.10  Dog Dander     kU/L <0.10  Cockroach     kU/L <0.10  Box Elder IgE     kU/L <0.10  Allergen, Comm Silver Wendee Copp, t9     kU/L <0.10  Allergen, Cedar tree, t12     kU/L <0.10  Allergen, Cottonwood, t14     kU/L <0.10  Allergen, Oak,t7     kU/L <0.10  Elm IgE     kU/L <0.10  Pecan/Hickory Tree IgE     kU/L <0.10  Allergen, Mulberry, t76     kU/L <0.10  [place] Grass     kU/L <0.10  Timothy Grass     kU/L <0.10  Johnson Grass     kU/L <0.10  COMMON RAGWEED (SHORT) (W1) IGE     kU/L <0.10  Rough Pigweed  IgE     kU/L <0.10  Sheep Sorrel IgE     kU/L <0.10  Allergen, Mouse Urine Protein, e78  kU/L <0.10  IgE (Immunoglobulin E), Serum     <OR=114 kU/L 124 (H)   Component     Latest Ref Rng & Units 11/26/2020  WBC     4.0 - 10.5 K/uL 16.9 (H)  RBC     3.87 - 5.11 Mil/uL 5.35 (H)  Hemoglobin     12.0 - 15.0 g/dL 14.5  HCT     36.0 - 46.0 % 43.6  MCV     78.0 - 100.0 fl 81.6  MCHC     30.0 - 36.0 g/dL 33.3  RDW     11.5 - 15.5 % 14.7  Platelets     150.0 - 400.0 K/uL 355.0  Neutrophils     43.0 - 77.0 % 90.9 (H)  Lymphocytes     12.0 - 46.0 % 5.0 (L)  Monocytes Relative     3.0 - 12.0 % 3.9  Eosinophil     0.0 - 5.0 % 0.0  Basophil     0.0 - 3.0 % 0.2  NEUT#     1.4 - 7.7 K/uL 15.3 (H)  Lymphocyte #     0.7 - 4.0 K/uL 0.8  Monocyte #     0.1 - 1.0 K/uL 0.7   Eosinophils Absolute     0.0 - 0.7 K/uL 0.0  Basophils Absolute     0.0 - 0.1 K/uL 0.0    Assessment and Plan: Kaitlyn Hays is a 41 y.o. female with: No problem-specific Assessment & Plan notes found for this encounter.  No follow-ups on file.  No orders of the defined types were placed in this encounter.  Lab Orders  No laboratory test(s) ordered today    Other allergy screening: Asthma: {Blank single:19197::"yes","no"} Rhino conjunctivitis: {Blank single:19197::"yes","no"} Food allergy: {Blank single:19197::"yes","no"} Medication allergy: {Blank single:19197::"yes","no"} Hymenoptera allergy: {Blank single:19197::"yes","no"} Urticaria: {Blank single:19197::"yes","no"} Eczema:{Blank single:19197::"yes","no"} History of recurrent infections suggestive of immunodeficency: {Blank single:19197::"yes","no"}  Diagnostics: Spirometry:  Tracings reviewed. Her effort: {Blank single:19197::"Good reproducible efforts.","It was hard to get consistent efforts and there is a question as to whether this reflects a maximal maneuver.","Poor effort, data can not be interpreted."} FVC: ***L FEV1: ***L, ***% predicted FEV1/FVC ratio: ***% Interpretation: {Blank single:19197::"Spirometry consistent with mild obstructive disease","Spirometry consistent with moderate obstructive disease","Spirometry consistent with severe obstructive disease","Spirometry consistent with possible restrictive disease","Spirometry consistent with mixed obstructive and restrictive disease","Spirometry uninterpretable due to technique","Spirometry consistent with normal pattern","No overt abnormalities noted given today's efforts"}.  Please see scanned spirometry results for details.  Skin Testing: {Blank single:19197::"Select foods","Environmental allergy panel","Environmental allergy panel and select foods","Food allergy panel","None","Deferred due to recent antihistamines use"}. *** Results discussed with  patient/family.   Past Medical History: Patient Active Problem List   Diagnosis Date Noted   Allergic rhinitis 06/05/2021   Voice hoarseness 06/05/2021   Allergic asthma 03/01/2021   Mild intermittent asthma 02/24/2020   Constipation 02/24/2020   GERD (gastroesophageal reflux disease) 02/24/2020   Abnormal Pap smear of cervix 08/26/2019   DUB (dysfunctional uterine bleeding) 01/12/2015   Encounter for routine gynecological examination 11/29/2012   Iron deficiency anemia 11/01/2011   Fibroids 11/01/2011   ANEMIA 07/08/2009   Past Medical History:  Diagnosis Date   Anemia    Asthma    Bronchitis    Fibroids    Gonorrhea    Tibia fracture 08/07/2013   Past Surgical History: Past Surgical History:  Procedure Laterality Date   NO PAST SURGERIES     TIBIA IM NAIL INSERTION Left 08/07/2013   Procedure: INTRAMEDULLARY (IM) NAIL TIBIAL;  Surgeon: Mauri Pole, MD;  Location: [facility];  Service: Orthopedics;  Laterality: Left;   Medication List:  Current Outpatient Medications  Medication Sig Dispense Refill   albuterol (PROVENTIL) (2.5 MG/3ML) 0.083% nebulizer solution Take 3 mLs (2.5 mg total) by nebulization every 6 (six) hours as needed for wheezing or shortness of breath. 75 mL 3   albuterol (VENTOLIN HFA) 108 (90 Base) MCG/ACT inhaler Inhale 1-2 puffs into the lungs every 6 (six) hours as needed for wheezing or shortness of breath. 8 g 5   azithromycin (ZITHROMAX Z-PAK) 250 MG tablet Take 2 tablets today; then 1 tablet daily x 4 days 6 tablet 0   cetirizine (ZYRTEC ALLERGY) 10 MG tablet Take 1 tablet (10 mg total) by mouth daily. 30 tablet 2   fluticasone (FLONASE) 50 MCG/ACT nasal spray Place 1 spray into both nostrils daily. 11.1 mL 2   fluticasone-salmeterol (ADVAIR HFA) 230-21 MCG/ACT inhaler Inhale 2 puffs into the lungs 2 (two) times daily. 1 each 11   megestrol (MEGACE) 40 MG tablet Take 1 tablet (40 mg total) by mouth 2 (two) times daily. Can increase to two tablets  twice a day in the event of heavy bleeding 60 tablet 5   montelukast (SINGULAIR) 10 MG tablet TAKE 1 TABLET(10 MG) BY MOUTH AT BEDTIME 30 tablet 11   predniSONE (DELTASONE) 10 MG tablet Take 4 tabs po daily x 3 days; then 3 tabs daily x3 days; then 2 tabs daily x3 days; then 1 tab daily x 3 days; then stop 30 tablet 0   predniSONE (DELTASONE) 10 MG tablet 4 tabs for 2 days, then 3 tabs for 2 days, 2 tabs for 2 days, then 1 tab for 2 days, then stop 20 tablet 0   promethazine-dextromethorphan (PROMETHAZINE-DM) 6.25-15 MG/5ML syrup Take 5 mLs by mouth 4 (four) times daily as needed for cough. 118 mL 0   No current facility-administered medications for this visit.   Allergies: No Known Allergies Social History: Social History   Socioeconomic History   Marital status: Significant Other    Spouse name: Not on file   Number of children: Not on file   Years of education: Not on file   Highest education level: Not on file  Occupational History   Not on file  Tobacco Use   Smoking status: Never   Smokeless tobacco: Never  Vaping Use   Vaping Use: Never used  Substance and Sexual Activity   Alcohol use: No   Drug use: No   Sexual activity: Yes    Partners: Female    Birth control/protection: Inserts  Other Topics Concern   Not on file  Social History Narrative   Not on file   Social Determinants of Health   Financial Resource Strain: Not on file  Food Insecurity: Food Insecurity Present   Worried About [place] in the Last Year: Sometimes true   Ran Out of Food in the Last Year: Sometimes true  Transportation Needs: No Transportation Needs   Lack of Transportation (Medical): No   Lack of Transportation (Non-Medical): No  Physical Activity: Not on file  Stress: Not on file  Social Connections: Not on file   Lives in a ***. Smoking: *** Occupation: ***  Environmental HistoryFreight forwarder in the house: Estate agent in the family  room: {Blank single:19197::"yes","no"} Carpet in the bedroom: {Blank single:19197::"yes","no"} Heating: {Blank single:19197::"electric","gas","heat pump"} Cooling: {Blank single:19197::"central","window","heat pump"} Pet: {Blank single:19197::"yes ***","no"}  Family History: Family History  Problem Relation  Age of Onset   Hypertension Other    Problem                               Relation Asthma                                   *** Eczema                                *** Food allergy                          *** Allergic rhino conjunctivitis     ***  Review of Systems  Constitutional:  Negative for appetite change, chills, fever and unexpected weight change.  HENT:  Negative for congestion and rhinorrhea.   Eyes:  Negative for itching.  Respiratory:  Negative for cough, chest tightness, shortness of breath and wheezing.   Cardiovascular:  Negative for chest pain.  Gastrointestinal:  Negative for abdominal pain.  Genitourinary:  Negative for difficulty urinating.  Skin:  Negative for rash.  Neurological:  Negative for headaches.   Objective: There were no vitals taken for this visit. There is no height or weight on file to calculate BMI. Physical Exam Vitals and nursing note reviewed.  Constitutional:      Appearance: Normal appearance. She is well-developed.  HENT:     Head: Normocephalic and atraumatic.     Right Ear: Tympanic membrane and external ear normal.     Left Ear: Tympanic membrane and external ear normal.     Nose: Nose normal.     Mouth/Throat:     Mouth: Mucous membranes are moist.     Pharynx: Oropharynx is clear.  Eyes:     Conjunctiva/sclera: Conjunctivae normal.  Cardiovascular:     Rate and Rhythm: Normal rate and regular rhythm.     Heart sounds: Normal heart sounds. No murmur heard.   No friction rub. No gallop.  Pulmonary:     Effort: Pulmonary effort is normal.     Breath sounds: Normal breath sounds. No wheezing, rhonchi or rales.   Musculoskeletal:     Cervical back: Neck supple.  Skin:    General: Skin is warm.     Findings: No rash.  Neurological:     Mental Status: She is alert and oriented to person, place, and time.  Psychiatric:        Behavior: Behavior normal.   The plan was reviewed with the patient/family, and all questions/concerned were addressed.  It was my pleasure to see Kaitlyn Hays today and participate in her care. Please feel free to contact me with any questions or concerns.  Sincerely,  Rexene Alberts, DO Allergy & Immunology  [facility] office: [place] office: [phone]

## 2021-06-28 ENCOUNTER — Ambulatory Visit: Payer: BC Managed Care – PPO | Admitting: Allergy

## 2021-07-08 ENCOUNTER — Ambulatory Visit: Payer: BC Managed Care – PPO | Admitting: Internal Medicine

## 2021-07-22 ENCOUNTER — Other Ambulatory Visit: Payer: Self-pay

## 2021-07-22 ENCOUNTER — Encounter: Payer: Self-pay | Admitting: Obstetrics and Gynecology

## 2021-07-22 ENCOUNTER — Ambulatory Visit (INDEPENDENT_AMBULATORY_CARE_PROVIDER_SITE_OTHER): Payer: BC Managed Care – PPO | Admitting: Obstetrics and Gynecology

## 2021-07-22 ENCOUNTER — Other Ambulatory Visit (HOSPITAL_COMMUNITY)
Admission: RE | Admit: 2021-07-22 | Discharge: 2021-07-22 | Disposition: A | Discharge status: Home / Self Care | Payer: BC Managed Care – PPO | Source: Ambulatory Visit | Attending: Obstetrics and Gynecology | Admitting: Obstetrics and Gynecology

## 2021-07-22 VITALS — BP 136/89 | HR 91 | Ht 71.0 in | Wt 253.3 lb

## 2021-07-22 DIAGNOSIS — Z01411 Encounter for gynecological examination (general) (routine) with abnormal findings: Secondary | ICD-10-CM

## 2021-07-22 DIAGNOSIS — Z124 Encounter for screening for malignant neoplasm of cervix: Secondary | ICD-10-CM

## 2021-07-22 DIAGNOSIS — Z1239 Encounter for other screening for malignant neoplasm of breast: Secondary | ICD-10-CM | POA: Diagnosis not present

## 2021-07-22 DIAGNOSIS — N938 Other specified abnormal uterine and vaginal bleeding: Secondary | ICD-10-CM | POA: Diagnosis not present

## 2021-07-22 DIAGNOSIS — D219 Benign neoplasm of connective and other soft tissue, unspecified: Secondary | ICD-10-CM

## 2021-07-22 MED ORDER — MEGESTROL ACETATE 40 MG PO TABS: 40.0000 mg | ORAL_TABLET | Freq: Two times a day (BID) | ORAL | 5 refills | Status: DC

## 2021-07-22 NOTE — Patient Instructions (Signed)

## 2021-07-22 NOTE — Progress Notes (Signed)
[name], female   DOB: 04/16/1981, 41 y.o.   MRN: [id number]  Kaitlyn Hays is a 41 y.o. G0P0000 female here for a routine annual gynecologic exam.  Current complaints: none.   She reports doing well with the BID Megace. Bleeding is controlled.    Gynecologic History No LMP recorded. (Menstrual status: Other). Contraception: condoms   Obstetric History OB History  Gravida Para Term Preterm AB Living  0 0 0 0 0 0  SAB IAB Ectopic Multiple Live Births  0 0 0 0      Past Medical History:  Diagnosis Date   Anemia    Asthma    Bronchitis    Fibroids    Gonorrhea    Tibia fracture 08/07/2013    Past Surgical History:  Procedure Laterality Date   NO PAST SURGERIES     TIBIA IM NAIL INSERTION Left 08/07/2013   Procedure: INTRAMEDULLARY (IM) NAIL TIBIAL;  Surgeon: Mauri Pole, MD;  Location: [facility];  Service: Orthopedics;  Laterality: Left;    Current Outpatient Medications on File Prior to Visit  Medication Sig Dispense Refill   albuterol (PROVENTIL) (2.5 MG/3ML) 0.083% nebulizer solution Take 3 mLs (2.5 mg total) by nebulization every 6 (six) hours as needed for wheezing or shortness of breath. 75 mL 3   albuterol (VENTOLIN HFA) 108 (90 Base) MCG/ACT inhaler Inhale 1-2 puffs into the lungs every 6 (six) hours as needed for wheezing or shortness of breath. 8 g 5   cetirizine (ZYRTEC ALLERGY) 10 MG tablet Take 1 tablet (10 mg total) by mouth daily. 30 tablet 2   cyclobenzaprine (FLEXERIL) 10 MG tablet Take 10 mg by mouth 3 (three) times daily as needed for muscle spasms. As needed     fluticasone (FLONASE) 50 MCG/ACT nasal spray Place 1 spray into both nostrils daily. 11.1 mL 2   fluticasone-salmeterol (ADVAIR HFA) 230-21 MCG/ACT inhaler Inhale 2 puffs into the lungs 2 (two) times daily. 1 each 11   montelukast (SINGULAIR) 10 MG tablet TAKE 1 TABLET(10 MG) BY MOUTH AT BEDTIME 30 tablet 11   naproxen (NAPROSYN) 500 MG tablet Take 500 mg by mouth 2 (two) times  daily with a meal.     No current facility-administered medications on file prior to visit.    No Known Allergies  Social History   Socioeconomic History   Marital status: Significant Other    Spouse name: Not on file   Number of children: Not on file   Years of education: Not on file   Highest education level: Not on file  Occupational History   Not on file  Tobacco Use   Smoking status: Never   Smokeless tobacco: Never  Vaping Use   Vaping Use: Never used  Substance and Sexual Activity   Alcohol use: No   Drug use: No   Sexual activity: Yes    Partners: Female    Birth control/protection: Inserts  Other Topics Concern   Not on file  Social History Narrative   Not on file   Social Determinants of Health   Financial Resource Strain: Not on file  Food Insecurity: Food Insecurity Present   Worried About [place] in the Last Year: Sometimes true   Ran Out of Food in the Last Year: Sometimes true  Transportation Needs: No Transportation Needs   Lack of Transportation (Medical): No   Lack of Transportation (Non-Medical): No  Physical Activity: Not on file  Stress: Not on file  Social Connections: Not on file  Intimate Partner Violence: Not on file    Family History  Problem Relation Age of Onset   Hypertension Other     The following portions of the patient's history were reviewed and updated as appropriate: allergies, current medications, past family history, past medical history, past social history, past surgical history and problem list.  Review of Systems Pertinent items noted in HPI and remainder of comprehensive ROS otherwise negative.   Objective:  BP 136/89    Pulse 91    Ht 5\' 11"  (1.803 m)    Wt 253 lb 4.8 oz (114.9 kg)    BMI 35.33 kg/m  CONSTITUTIONAL: Well-developed, well-nourished female in no acute distress.  HENT:  Normocephalic, atraumatic, External right and left ear normal. Oropharynx is clear and moist EYES: Conjunctivae and  EOM are normal. Pupils are equal, round, and reactive to light. No scleral icterus.  NECK: Normal range of motion, supple, no masses.  Normal thyroid.  SKIN: Skin is warm and dry. No rash noted. Not diaphoretic. No erythema. No pallor. [place]: Alert and oriented to person, place, and time. Normal reflexes, muscle tone coordination. No cranial nerve deficit noted. PSYCHIATRIC: Normal mood and affect. Normal behavior. Normal judgment and thought content. CARDIOVASCULAR: Normal heart rate noted, regular rhythm RESPIRATORY: Clear to auscultation bilaterally. Effort and breath sounds normal, no problems with respiration noted. BREASTS: Symmetric in size. No masses, skin changes, nipple drainage, or lymphadenopathy. ABDOMEN: Soft, normal bowel sounds, no distention noted.  No tenderness, rebound or guarding.  PELVIC: Normal appearing external genitalia; normal appearing vaginal mucosa and cervix.  No abnormal discharge noted.  Pap smear obtained. Enlarged uterus 16-18 weeks, irregular contour. MUSCULOSKELETAL: Normal range of motion. No tenderness.  No cyanosis, clubbing, or edema.  2+ distal pulses.   Assessment:  Annual gynecologic examination with pap smear DUB Uterine fiboids Plan:  Will follow up results of pap smear and manage accordingly. Mammogram scheduled Discussed Tx options for DUB and uterine fibroids. Pt desires to continue with Megace for now as this is working for her. Megace refilled Routine preventative health maintenance measures emphasized. Please refer to After Visit Summary for other counseling recommendations.    [name], MD, [place] Attending [place] for [facility], [place]

## 2021-07-29 ENCOUNTER — Encounter: Payer: Self-pay | Admitting: Obstetrics and Gynecology

## 2021-07-29 ENCOUNTER — Telehealth: Payer: Self-pay

## 2021-07-29 LAB — CYTOLOGY - PAP
Comment: NEGATIVE
Comment: NEGATIVE
Diagnosis: UNDETERMINED — AB
HPV 16: NEGATIVE
HPV 18 / 45: NEGATIVE
High risk HPV: POSITIVE — AB

## 2021-07-29 NOTE — Telephone Encounter (Signed)
I called patient and notified her of upcoming appointment on 08/19/21 at 2:15 PM with Dr. Rip Harbour for colposcopy. Patient verbalized understanding and denied any other concerns or questions.   Paulina Fusi, RN 07/29/21

## 2021-08-19 ENCOUNTER — Ambulatory Visit (INDEPENDENT_AMBULATORY_CARE_PROVIDER_SITE_OTHER): Payer: BC Managed Care – PPO | Admitting: Obstetrics and Gynecology

## 2021-08-19 ENCOUNTER — Other Ambulatory Visit: Payer: Self-pay

## 2021-08-19 ENCOUNTER — Encounter: Payer: Self-pay | Admitting: Obstetrics and Gynecology

## 2021-08-19 ENCOUNTER — Other Ambulatory Visit (HOSPITAL_COMMUNITY)
Admission: RE | Admit: 2021-08-19 | Discharge: 2021-08-19 | Disposition: A | Discharge status: Home / Self Care | Payer: BC Managed Care – PPO | Source: Ambulatory Visit | Attending: Obstetrics and Gynecology | Admitting: Obstetrics and Gynecology

## 2021-08-19 DIAGNOSIS — R87610 Atypical squamous cells of undetermined significance on cytologic smear of cervix (ASC-US): Secondary | ICD-10-CM | POA: Insufficient documentation

## 2021-08-19 DIAGNOSIS — Z3202 Encounter for pregnancy test, result negative: Secondary | ICD-10-CM

## 2021-08-19 LAB — POCT PREGNANCY, URINE: Preg Test, Ur: NEGATIVE

## 2021-08-19 NOTE — Patient Instructions (Signed)
Colposcopy, Care After The following information offers guidance on how to care for yourself after your procedure. Your doctor may also give you more specific instructions. If you have problems or questions, contact your doctor. What can I expect after the procedure? If you did not have a sample of your tissue taken out (did not have a biopsy), you may only have some spotting of blood for a few days. You can go back to your normal activities. If you had a sample of your tissue taken out, it is common to have: Soreness and mild pain. These may last for a few days. Mild bleeding or fluid (discharge) coming from your vagina. The fluid will look dark and grainy. You may have this for a few days. The fluid may be caused by a liquid that was used during your procedure. You may need to wear a sanitary pad. Spotting of blood for at least 48 hours after the procedure. Follow these instructions at home: Medicines Take over-the-counter and prescription medicines only as told by your doctor. Ask your doctor what over-the-counter pain medicines and prescription medicines you can start taking again. This is very important if you take blood thinners. Activity For at least 3 days, or for as long as told by your doctor, avoid: Douching. Using tampons. Having sex. Return to your normal activities as told by your doctor. Ask your doctor what activities are safe for you. General instructions Ask your doctor if you may take baths, swim, or use a hot tub. You may take showers. If you use birth control (contraception), keep using it. Keep all follow-up visits. Contact a doctor if: You have a fever or chills. You faint or feel light-headed. Get help right away if: You bleed a lot from your vagina. A lot of bleeding means that the bleeding soaks through a pad in less than 1 hour. You have clumps of blood (blood clots) coming from your vagina. You have signs that could mean you have an infection. This may be fluid  coming from your vagina that is: Different than normal. Yellow. Bad-smelling. You have very bad pain or cramps in your lower belly that do not get better with medicine. Summary If you did not have a sample of your tissue taken out, you may only have some spotting of blood for a few days. You can go back to your normal activities. If you had a sample of your tissue taken out, it is common to have mild pain for a few days and spotting for 48 hours. Avoid douching, using tampons, and having sex for at least 3 days after the procedure or for as long as told. Get help right away if you have a lot of bleeding, very bad pain, or signs of infection. This information is not intended to replace advice given to you by your health care provider. Make sure you discuss any questions you have with your health care provider. Document Revised: 10/25/2020 Document Reviewed: 10/25/2020 Elsevier Patient Education  2022 [place]. Colposcopy Colposcopy is a procedure to examine the lowest part of the uterus (cervix) for abnormalities or signs of disease. This procedure is done using an instrument that makes objects appear larger and provides light (colposcope). During the procedure, your health care provider may remove a tissue sample to look at under a microscope (biopsy). A biopsy may be done if any unusual cells are seen during the colposcopy. You may have a colposcopy if you have: An abnormal Pap smear, also called a Pap  test. This screening test is used to check for signs of cancer or infection of the vagina, cervix, and uterus. An HPV (human papillomavirus) test and get a positive result for a type of HPV that puts you at high risk of cancer. Certain conditions or symptoms, such as: A sore, or lesion, on your cervix. Genital warts on your vulva, vagina, or cervix. Pain during sex. Vaginal bleeding, especially after sex. A growth on your cervix (cervical polyp) that needs to be removed. Let your health  care provider know about: Any allergies you have, including allergies to medicines, latex, or iodine. All medicines you are taking, including vitamins, herbs, eye drops, creams, and over-the-counter medicines. Any bleeding problems you have. Any surgeries you have had. Any medical conditions you have, such as pelvic inflammatory disease (PID) or an endometrial disorder. The pattern of your menstrual cycles and the form of birth control (contraception) you use, if any. Your medical history, including any cervical treatments and how well you tolerated the procedure (if you have ever fainted). Whether you are pregnant or may be pregnant. What are the risks? Generally, this is a safe procedure. However, problems may occur, including: Infection. Symptoms of infection may include fever, bad-smelling vaginal discharge, or pelvic pain. Vaginal bleeding. Allergic reactions to medicines. Damage to nearby structures or organs. What happens before the procedure? Medicines Ask your health care provider about: Changing or stopping your regular medicines. This is especially important if you are taking diabetes medicines or blood thinners. Taking medicines such as aspirin and ibuprofen. These medicines can thin your blood. Do not take these medicines unless your health care provider tells you to take them. Your health care provider will likely tell you to avoid taking aspirin, or medicine that contains aspirin, for 7 days before the procedure. Taking over-the-counter medicines, vitamins, herbs, and supplements. General instructions Tell your health care provider if you have your menstrual period now or will have it at the time of your procedure. A colposcopy is not normally done during your menstrual period. If you use contraception, continue to use it before your procedure. For 24 hours before the procedure: Do not use douche products or tampons. Do not use medicines, creams, or suppositories in the  vagina. Do not have sex or insert anything into your vagina. Ask your health care provider what steps will be taken to prevent infection. What happens during the procedure? You will lie down on your back, with your feet in foot rests (stirrups). An instrument called a speculum will be inserted into your vagina. This will be used so your health care provider can see your cervix and the inside of your vagina. A cotton swab will be used to place a small amount of a liquid (solution) on the areas to be examined. This solution makes it easier to see abnormal cells. You may feel a slight burning during this part. The colposcope will be used to scan the cervix with a bright white light. The colposcope will be held near your vulva and will make your vulva, vagina, and cervix look bigger so they can be seen better. If a biopsy is needed: You may be given a medicine to numb the area (local anesthetic). Surgical tools will be used to remove mucus and cells through your vagina. You may feel mild pain while the tissue sample is removed. Bleeding may occur. A solution may be used to stop the bleeding. The tissue removed will be sent to a lab to be looked at under  a microscope. The procedure may vary among health care providers and hospitals. What happens after the procedure? You may have some cramping in your abdomen. This should go away after a few minutes. It is up to you to get the results of your procedure. Ask your health care provider, or the department that is doing the procedure, when your results will be ready. Summary Colposcopy is a procedure to examine the lowest part of the uterus (cervix), for signs of disease. A biopsy may be done as part of the procedure. You may have some cramping in your abdomen. This should go away after a few minutes. It is up to you to get the results of your procedure. Ask your health care provider, or the department that is doing the procedure, when your results will  be ready. This information is not intended to replace advice given to you by your health care provider. Make sure you discuss any questions you have with your health care provider. Document Revised: 10/25/2020 Document Reviewed: 10/25/2020 Elsevier Patient Education  [place].

## 2021-08-19 NOTE — Progress Notes (Signed)
? ? ?  GYNECOLOGY CLINIC COLPOSCOPY PROCEDURE NOTE ? ?41 y.o. G0P0000 here for colposcopy for ASCUS with POSITIVE high risk HPV pap smear on 2/23. Discussed role for HPV in cervical dysplasia, need for surveillance. ? ?Patient given informed consent, signed copy in the chart, time out was performed.  Placed in lithotomy position. Cervix viewed with speculum and colposcope after application of acetic acid.  ? ?Colposcopy adequate? Yes ? acetowhite lesion(s) noted at 12 & 6 o'clock; corresponding biopsies obtained.  ECC specimen obtained. Monsel's applied ?All specimens were labelled and sent to pathology. ? ?Patient was given post procedure instructions.  Will follow up pathology and manage accordingly.  Routine preventative health maintenance measures emphasized. ? ? ? ?Arlina Robes, MD, FACOG ?Attending [place] for [employer], [place] ? ? ? ? ?  ?

## 2021-08-24 LAB — SURGICAL PATHOLOGY

## 2021-09-10 ENCOUNTER — Ambulatory Visit: Payer: BC Managed Care – PPO

## 2021-09-13 ENCOUNTER — Ambulatory Visit
Admission: RE | Admit: 2021-09-13 | Discharge: 2021-09-13 | Disposition: A | Discharge status: Home / Self Care | Payer: BC Managed Care – PPO | Source: Ambulatory Visit | Attending: Obstetrics and Gynecology | Admitting: Obstetrics and Gynecology

## 2021-09-13 DIAGNOSIS — Z1239 Encounter for other screening for malignant neoplasm of breast: Secondary | ICD-10-CM

## 2021-09-14 ENCOUNTER — Encounter: Payer: Self-pay | Admitting: Obstetrics and Gynecology

## 2021-09-14 ENCOUNTER — Ambulatory Visit (INDEPENDENT_AMBULATORY_CARE_PROVIDER_SITE_OTHER): Payer: BC Managed Care – PPO | Admitting: Obstetrics and Gynecology

## 2021-09-14 ENCOUNTER — Ambulatory Visit: Payer: BC Managed Care – PPO | Admitting: Obstetrics and Gynecology

## 2021-09-14 VITALS — BP 146/94 | HR 106 | Wt 245.9 lb

## 2021-09-14 DIAGNOSIS — D219 Benign neoplasm of connective and other soft tissue, unspecified: Secondary | ICD-10-CM

## 2021-09-14 DIAGNOSIS — N938 Other specified abnormal uterine and vaginal bleeding: Secondary | ICD-10-CM | POA: Diagnosis not present

## 2021-09-14 DIAGNOSIS — R87612 Low grade squamous intraepithelial lesion on cytologic smear of cervix (LGSIL): Secondary | ICD-10-CM

## 2021-09-14 NOTE — Patient Instructions (Signed)
Hysterectomy Information ?A hysterectomy is a surgery to take out the womb (uterus) or the womb and the lowest part of the womb (cervix), which opens into the vagina. The ovaries, the fallopian tubes, or both may also be taken out. ?After the procedure, a woman will no longer have menstrual periods and will not be able to get pregnant. ?What are the benefits? ?This surgery may improve your quality of life by relieving pain and heavy vaginal bleeding. This surgery may also: ?Treat long-term (chronic) infection in the area between your hip bones (pelvis). ?Treat conditions that affect the womb. ?Treat cancer of the womb or cervix. ?Lower the risk of cancer. ?It may also be done for transgender men to match their gender identity. ?What are the risks? ?Generally, this surgery is safe. But problems can happen, including: ?Bleeding. ?Needing donated blood (transfusion). ?Blood clots. ?Infection. ?Damage to nearby parts or organs. ?Allergic reactions to medicines. ?Needing to switch from a surgery that requires small cuts (incisions) to a surgery that requires a large cut. ?What are the different types? ?These are the three types: ?The top part of the womb is taken out, but not the cervix. ?The womb and cervix are taken out. ?The womb, the cervix, and the tissue that holds the womb in place are taken out. ?What happens during the procedure? ?This surgery may be done in one of these ways: ?A cut is made in the belly (abdomen). The womb is taken out through the opening. ?A cut is made in the vagina. The womb is taken out through the opening that is made. ?A device with a camera to help see is put through one of 3 or 4 cuts made in the belly. The womb is taken out through the vagina. ?A device with a camera to help see is put through one of 3 or 4 cuts made in the belly. The womb is cut into pieces and taken out through the openings or through the vagina. ?A computer helps control the surgical tools put into 3 or 4 cuts made  in the belly. The womb is cut into small pieces. The pieces are taken out through the openings or through the vagina. ?Talk with your doctor to see which is the right one for you. ?The procedures may vary among doctors and hospitals. ?What happens after the procedure? ?You will be given pain medicine. ?You may need to stay in the hospital for 1-2 days. ?You may need to have a responsible adult stay with you for a few days after you go home. ?If your ovaries were taken out, you may get hot flashes, have night sweats, and have trouble sleeping. ?Talk with your doctor about how often you need Pap tests. ?Keep all follow-up visits. ?Questions to ask your doctor ?Is this surgery needed? What other options do I have? ?What organs need to be taken out? ?How long will I need to stay in the hospital? ?How long will it take to get better at home? ?What symptoms can I expect after the procedure? ?Summary ?A hysterectomy is a surgery to take out your womb. ?This may be done to treat conditions. ?After this surgery, you will not have periods and you cannot get pregnant. ?There are different types of this surgery. Talk with your doctor about which is best for you. ?This surgery is safe, but there are some risks. ?This information is not intended to replace advice given to you by your health care provider. Make sure you discuss any questions you  have with your health care provider. ?Document Revised: 03/25/2020 Document Reviewed: 03/25/2020 ?Elsevier Patient Education ? [place]. ? ?

## 2021-09-14 NOTE — Progress Notes (Signed)
Kaitlyn Hays presents for follow up from colpo. ?Colpo results reviewed with pt ? ?Bleeding controlled with Megace. Would like to continue ? ? ?PE AF VSS ?Lungs clear  Heart RRR ?Abd soft + BS ? ?A/P Abnormal pap smear ?       DUB ? ?Repeat pap smear in 1 yr. Discussed DUB. Will reduce Megace to qd and see how pt responds. Pt instructed that she may increase back to bid if bleeding returns. Will reevaluate in 4 months ?

## 2021-10-21 ENCOUNTER — Other Ambulatory Visit: Payer: Self-pay | Admitting: Primary Care

## 2021-10-22 ENCOUNTER — Other Ambulatory Visit: Payer: Self-pay | Admitting: Primary Care

## 2021-12-06 ENCOUNTER — Ambulatory Visit: Payer: BC Managed Care – PPO | Admitting: Primary Care

## 2021-12-06 NOTE — Progress Notes (Deleted)
$'@Patient's$  ID: Kaitlyn Hays, female    DOB: 10-Aug-1980, 41 y.o.   MRN: [id number]  No chief complaint on file.   Referring provider: No ref. provider found  HPI:  41 year old female, never smoked. PMH significant for iron deficiency anemia, chronic cough. Patient of [name], seen for initial consult on 12/30/19.   Previous [facility] pulmonary encounter: 12/30/19- [name], consult  Kaitlyn Hays 41 y.o. -is a new consult.  She is here for chronic cough for the last 8 weeks.  She tells me is of insidious onset.  It is stable but severe.  It is waking her up at night and associated with wheezing.  There is also bilateral muscle pull in the infrascapular region because of the cough.  The cough is moderate to severe.  There is no fever.  There is also sinus congestion.  Prior to this she has never had cough there is no ACE inhibitor there is no acid reflux.  She does not have any known allergies other than in the springtime she does have some some sinus congestion that is mild.  But has never had cough or wheezing in the past.  She denies any viral infection in the past.  However she is not had any Covid vaccine.  This because she is scared of the Covid vaccine.  There is no vomiting with the cough.  No shortness of breath.  No orthopnea but she is waking up because of coughing.  No fever or chills or weight loss.  No diplopia.  No stroke symptoms.  She has urinary incontinence with coughing.  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 She had second hand smoke exposure as a child and adult. She reports having problem being able to breath at night. Associated chest tightness, productive cough with clear mucus and sneezing. Nasal drainage has been yellow. Started on Breo 100 at last visit. Her Eosinophils were 900 and IgE 91. Her original complaints was cough, this improved somewhat with low ICS/LABA sample. She has gained 30 lbs in the last year.  Denies trouble swallowing, no muscle weakness.   11/25/2020 Presents for follow up of cough variant asthma after PFT's ordered by [name]. She states her breathing is currently not good. She states her chest is tight. She is coughing . She states she does wheeze at intervals. She states she does have night time awakenings because of this. She states she was treated with a prednisone taper after she was seen 5/19. She has been off steroids since 5/28. She did not endorse any significant improvement in her breathing after steroid use. She states her breathing is worse outside in the heat and humidity. 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 When I asked her if she has used her albuterol inhaler, she told me she had not. Additionally I am unsure how compliant she is with her Advair. I think there is a significant need for education regarding compliance with maintenance medication and rescue medications, and when to use them. She states she is compliant with her Singulair at bedtime. PFT's were difficult for the patient. She had a very difficult time performing the maneuvers. She had poor technique. When we tried to get a FENO, she was unable to complete the breathing maneuvers. They appear to confirm restriction , as was noted in previous testing. F/F Ratio does not show obstruction  at 85%. FVC and FEV1 are low at 44% and 46% respectively. TLC is 72%. DLCO is 84%. After speaking with the PFT Tech, I am unsure of how accurate these  these PFT's are as the patient had a very difficult time with technique.She denies any fever, chest pain or orthopnea. She is not wheezing today on exam, but she does endorse wheezing. She has not been using her rescue inhaler when she wheezes. Labs ordered at her 5/19 appointment with [name] were not drawn. We will draw these today, and based on results consider biologic therapy.    03/01/2021 Patient  presents today for acute visit. Hx asthma. Patient developed cough 10 days ago. She has been tasking over the counter tylenol cold medicine. She is compliant Advair 115-65mg 2 puff twice as day, Singulair, Flonase and zyrtec. She has been requiring albuterol rescue inhaler three times a day. She has been experiencing flare of her asthma symptoms every other month. She last was on oral prednisone in June 2022. IgE elevated 124.   06/04/2021  Patient presents today for acute OV/cough. Patient reports having chronic cough with associated voice hoarseness x1 month. Cough is mostly dry, occasionally productive with clear-cloudy mucus. She coughs after eating. She has a sore throat on the right side. She is compliant with Zyrtec and Singulair as prescribed. She went to ED on 05/14/21, received solumedrol. She has only been using albuterol (ventolin ) in the morning and proair in the evening. She is not currently on maintenance inhaler, she ran out of Advair inhaler two months ago. Denies f/c/s, heart burn or difficulty swallowing.    12/06/2021  Interim hx  Patient presents today for an overdue follow-up.  During her last office visit in December she was treated for an acute exacerbation with Z-Pak and prednisone taper.  She was instructed to resume Advair hfa 2 puffs twice daily.  She is maintained on Singulair and Zyrtec.    Pulmonary function testing: FVC 2.52 (64%), FEV1 2.03 (63%), ratio 81, DLCO  Interpretation: Moderate restriction. No bronchodilatory response. Normal diffusion capacity.   Imaging:  01/02/20 CXR-  Clear lungs, no active cardiopulmonary process     No Known Allergies  Immunization History  Administered Date(s) Administered   PFIZER(Purple Top)SARS-COV-2 Vaccination 02/21/2020, 03/19/2020    Past Medical History:  Diagnosis Date   Anemia    Asthma    Bronchitis    Fibroids    Gonorrhea    Tibia fracture 08/07/2013    Tobacco History: Social History   Tobacco Use   Smoking Status Never  Smokeless Tobacco Never   Counseling given: Not Answered   Outpatient Medications Prior to Visit  Medication Sig Dispense Refill   albuterol (PROVENTIL) (2.5 MG/3ML) 0.083% nebulizer solution Take 3 mLs (2.5 mg total) by nebulization every 6 (six) hours as needed for wheezing or shortness of breath. 75 mL 3   albuterol (VENTOLIN HFA) 108 (90 Base) MCG/ACT inhaler Inhale 1-2 puffs into the lungs every 6 (six) hours as needed for wheezing or shortness of breath. 8 g 5   amoxicillin (AMOXIL) 500 MG capsule Take 500 mg by mouth 3 (three) times daily.     cetirizine (ZYRTEC ALLERGY) 10 MG tablet Take 1 tablet (10 mg total) by mouth daily. 30 tablet 2   cyclobenzaprine (FLEXERIL) 10 MG tablet Take 10 mg by mouth 3 (three) times daily as needed for muscle spasms. As needed     fluticasone (FLONASE) 50 MCG/ACT nasal spray Place 1 spray into  both nostrils daily. 11.1 mL 2   fluticasone-salmeterol (ADVAIR HFA) 230-21 MCG/ACT inhaler Inhale 2 puffs into the lungs 2 (two) times daily. 1 each 11   megestrol (MEGACE) 40 MG tablet Take 1 tablet (40 mg total) by mouth 2 (two) times daily. Can increase to two tablets twice a day in the event of heavy bleeding 60 tablet 5   montelukast (SINGULAIR) 10 MG tablet TAKE 1 TABLET(10 MG) BY MOUTH AT BEDTIME 30 tablet 11   naproxen (NAPROSYN) 500 MG tablet Take 500 mg by mouth 2 (two) times daily with a meal.     No facility-administered medications prior to visit.      Review of Systems  Review of Systems   Physical Exam  There were no vitals taken for this visit. Physical Exam   Lab Results:  CBC    Component Value Date/Time   WBC 16.9 (H) 11/26/2020 1410   RBC 5.35 (H) 11/26/2020 1410   HGB 14.5 11/26/2020 1410   HGB 10.6 (L) 10/08/2019 0913   HCT 43.6 11/26/2020 1410   HCT 36.3 10/08/2019 0913   PLT 355.0 11/26/2020 1410   PLT 428 10/08/2019 0913   MCV 81.6 11/26/2020 1410   MCV 72 (L) 10/08/2019 0913   MCH 20.9 (L)  10/08/2019 0913   MCH 16.2 (L) 01/12/2015 1552   MCHC 33.3 11/26/2020 1410   RDW 14.7 11/26/2020 1410   RDW 19.0 (H) 08/19/2019 1455   LYMPHSABS 0.8 11/26/2020 1410   MONOABS 0.7 11/26/2020 1410   EOSABS 0.0 11/26/2020 1410   BASOSABS 0.0 11/26/2020 1410    BMET    Component Value Date/Time   NA 141 11/19/2013 0338   K 3.5 (L) 11/19/2013 0338   CL 102 11/19/2013 0338   CO2 24 08/08/2013 0355   GLUCOSE 92 11/19/2013 0338   BUN 12 11/19/2013 0338   CREATININE 0.80 11/19/2013 0338   CALCIUM 9.3 08/08/2013 0355   GFRNONAA >90 08/08/2013 0355   GFRAA >90 08/08/2013 0355    BNP No results found for: "BNP"  ProBNP No results found for: "PROBNP"  Imaging: No results found.   Assessment & Plan:   No problem-specific Assessment & Plan notes found for this encounter.     Martyn Ehrich, NP 12/06/2021

## 2021-12-16 ENCOUNTER — Ambulatory Visit: Payer: BC Managed Care – PPO | Admitting: Nurse Practitioner

## 2022-01-23 ENCOUNTER — Other Ambulatory Visit: Payer: Self-pay | Admitting: Primary Care

## 2022-03-18 ENCOUNTER — Encounter: Payer: Self-pay | Admitting: Nurse Practitioner

## 2022-03-18 ENCOUNTER — Ambulatory Visit (INDEPENDENT_AMBULATORY_CARE_PROVIDER_SITE_OTHER): Payer: BC Managed Care – PPO | Admitting: Nurse Practitioner

## 2022-03-18 ENCOUNTER — Ambulatory Visit (INDEPENDENT_AMBULATORY_CARE_PROVIDER_SITE_OTHER): Payer: BC Managed Care – PPO

## 2022-03-18 ENCOUNTER — Telehealth: Payer: Self-pay | Admitting: Nurse Practitioner

## 2022-03-18 ENCOUNTER — Encounter: Payer: Self-pay | Admitting: *Deleted

## 2022-03-18 VITALS — BP 140/90 | HR 117 | Temp 98.6°F | Ht 71.0 in | Wt 264.2 lb

## 2022-03-18 DIAGNOSIS — J4541 Moderate persistent asthma with (acute) exacerbation: Secondary | ICD-10-CM

## 2022-03-18 DIAGNOSIS — R0602 Shortness of breath: Secondary | ICD-10-CM | POA: Diagnosis not present

## 2022-03-18 DIAGNOSIS — U099 Post covid-19 condition, unspecified: Secondary | ICD-10-CM | POA: Diagnosis not present

## 2022-03-18 DIAGNOSIS — R7989 Other specified abnormal findings of blood chemistry: Secondary | ICD-10-CM

## 2022-03-18 LAB — BASIC METABOLIC PANEL
BUN: 19 mg/dL (ref 6–23)
CO2: 24 mEq/L (ref 19–32)
Calcium: 9.8 mg/dL (ref 8.4–10.5)
Chloride: 103 mEq/L (ref 96–112)
Creatinine, Ser: 0.88 mg/dL (ref 0.40–1.20)
GFR: 80.72 mL/min (ref >60.00)
Glucose, Bld: 104 mg/dL — ABNORMAL HIGH (ref 70–99)
Potassium: 3.8 mEq/L (ref 3.5–5.1)
Sodium: 138 mEq/L (ref 135–145)

## 2022-03-18 LAB — D-DIMER, QUANTITATIVE: D-Dimer, Quant: 0.52 mcg/mL FEU — ABNORMAL HIGH (ref <0.50)

## 2022-03-18 MED ORDER — METHYLPREDNISOLONE ACETATE 80 MG/ML IJ SUSP
120.0000 mg | Freq: Once | INTRAMUSCULAR | Status: AC
  Administered 2022-03-18: 120 mg via INTRAMUSCULAR

## 2022-03-18 MED ORDER — PREDNISONE 10 MG PO TABS: ORAL_TABLET | ORAL | 0 refills | Status: DC

## 2022-03-18 MED ORDER — DOXYCYCLINE HYCLATE 100 MG PO TABS: 100.0000 mg | ORAL_TABLET | Freq: Two times a day (BID) | ORAL | 0 refills | Status: DC

## 2022-03-18 MED ORDER — IPRATROPIUM-ALBUTEROL 0.5-2.5 (3) MG/3ML IN SOLN
3.0000 mL | Freq: Every day | RESPIRATORY_TRACT | Status: DC | PRN
  Administered 2022-03-18: 3 mL via RESPIRATORY_TRACT

## 2022-03-18 NOTE — Telephone Encounter (Signed)
ATC x2, line rings and then goes to a fast busy signal.  She only has one #, [phone].  No one listed on DPR to speak to.  ATC father's # to see if she has an alternative #.  No answer, no message listed.  Mychart message letting her know that we could not reach her and she would need to go to the ER to have a CT angiogram to rule out a clot.  Nothing further needed.

## 2022-03-18 NOTE — Progress Notes (Signed)
$'@Patient'P$  ID: Kaitlyn Hays, female    DOB: Apr 03, 1981, 41 y.o.   MRN: [id number]  Chief Complaint  Patient presents with   Follow-up    Coughing, SOB, fatigued     Referring provider: No ref. provider found  HPI: 41 year old female, never smoker followed for allergic asthma and chronic cough. She is a patient of Dr. Golden Pop and last seen in office 06/04/2021 by Volanda Napoleon NP. Past medical history significant for allergic rhinitis, GERD, IDA, fibroids.   TEST/EVENTS:  12/30/2019 eos 600 11/25/2020 PFT: FVC 44, FEV1 46, ratio 82, TLC 72, DLCOcor 84.  No BD 11/26/2020 eos 0, IgE 124  06/04/2021: OV with Volanda Napoleon NP for acute visit with cough.  Reports that she has had a chronic cough with associated hoarseness x1 month.  Cough is mostly dry.  Occasionally productive with clear-cloudy mucus.  Reports that she does cough after eating.  She also has a sore throat on the right side.  She has been compliant with Zyrtec and Singulair as prescribed.  She went to the ED on 05/14/2021 and received Solu-Medrol.  She has only been using Ventolin in the morning and ProAir in the evening.  She is not currently on maintenance inhaler.  She ran out of Advair 2 months ago.  Treated for acute exacerbation with Z-Pak and prednisone burst x5 days.  Advised her to resume maintenance therapy with Advair 232 puffs twice a day.   03/18/2022: Today - acute Patient presents today for acute visit.  She had tested positive 01/20/2022 for COVID.  Feels like she has had poor progress since.  Feels like her breathing has progressively gotten worse.  She was seen by her PCP a few weeks ago and treated with steroid burst which did not make much of a difference.  She also took a Z-Pak prior to this without change.  She is short winded and extremely fatigued.  Feels like she just wants to sleep all the time.  She has also been wheezing and has a persistent cough that is paroxysmal at times.  Sputum is usually clear to cloudy.  Feels  like she can't sleep well at night because of her breathing/cough. Denies any hemoptysis, calf pain/swelling, orthopnea, fevers, chills, night sweats.  She is using Advair twice daily.  Occasionally uses her nebulizer machine but not on a consistent basis.  She has used it once or twice a week for the last few weeks. She's on zyrtec and singulair, which she takes daily.   No Known Allergies  Immunization History  Administered Date(s) Administered   PFIZER(Purple Top)SARS-COV-2 Vaccination 02/21/2020, 03/19/2020    Past Medical History:  Diagnosis Date   Anemia    Asthma    Bronchitis    Fibroids    Gonorrhea    Tibia fracture 08/07/2013    Tobacco History: Social History   Tobacco Use  Smoking Status Never  Smokeless Tobacco Never   Counseling given: Not Answered   Outpatient Medications Prior to Visit  Medication Sig Dispense Refill   albuterol (PROVENTIL) (2.5 MG/3ML) 0.083% nebulizer solution Take 3 mLs (2.5 mg total) by nebulization every 6 (six) hours as needed for wheezing or shortness of breath. 75 mL 3   albuterol (VENTOLIN HFA) 108 (90 Base) MCG/ACT inhaler Inhale 1-2 puffs into the lungs every 6 (six) hours as needed for wheezing or shortness of breath. 8 g 5   cetirizine (ZYRTEC ALLERGY) 10 MG tablet Take 1 tablet (10 mg total) by mouth daily. Hauser  tablet 2   fluticasone (FLONASE) 50 MCG/ACT nasal spray Place 1 spray into both nostrils daily. 11.1 mL 2   fluticasone-salmeterol (ADVAIR HFA) 230-21 MCG/ACT inhaler Inhale 2 puffs into the lungs 2 (two) times daily. 1 each 11   hydrochlorothiazide (HYDRODIURIL) 25 MG tablet Take 25 mg by mouth daily.     megestrol (MEGACE) 40 MG tablet Take 1 tablet (40 mg total) by mouth 2 (two) times daily. Can increase to two tablets twice a day in the event of heavy bleeding 60 tablet 5   montelukast (SINGULAIR) 10 MG tablet TAKE 1 TABLET(10 MG) BY MOUTH AT BEDTIME 30 tablet 11   naproxen (NAPROSYN) 500 MG tablet Take 500 mg by mouth 2  (two) times daily with a meal.     cyclobenzaprine (FLEXERIL) 10 MG tablet Take 10 mg by mouth 3 (three) times daily as needed for muscle spasms. As needed (Patient not taking: Reported on 03/18/2022)     amoxicillin (AMOXIL) 500 MG capsule Take 500 mg by mouth 3 (three) times daily. (Patient not taking: Reported on 03/18/2022)     No facility-administered medications prior to visit.     Review of Systems:   Constitutional: No weight loss or gain, night sweats, fevers, chills, +fatigue, +lassitude. HEENT: No headaches, difficulty swallowing, tooth/dental problems, or sore throat. No sneezing, itching, ear ache, nasal congestion, or post nasal drip CV:  No chest pain, orthopnea, PND, swelling in lower extremities, anasarca, dizziness, palpitations, syncope Resp: +shortness of breath with exertion; cough; wheezing; chest discomfort. No hemoptysis.  No chest wall deformity GI:  No heartburn, indigestion, abdominal pain, nausea, vomiting, diarrhea, change in bowel habits, loss of appetite, bloody stools.  Skin: No rash, lesions, ulcerations MSK:  No joint pain or swelling.  No decreased range of motion.  No back pain. Neuro: No dizziness or lightheadedness.  Psych: No depression or anxiety. Mood stable. +sleep disturbance    Physical Exam:  BP (!) 140/90 (BP Location: Right Arm, Cuff Size: Normal)   Pulse (!) 117   Temp 98.6 F (37 C)   Ht '5\' 11"'$  (1.803 m)   Wt 264 lb 3.2 oz (119.8 kg)   SpO2 98%   BMI 36.85 kg/m   GEN: Pleasant, interactive,acutely-ill appearing; in no acute distress. HEENT:  Normocephalic and atraumatic. PERRLA. Sclera white. Nasal turbinates pink, moist and patent bilaterally. No rhinorrhea present. Oropharynx pink and moist, without exudate or edema. No lesions, ulcerations, or postnasal drip.  NECK:  Supple w/ fair ROM. No JVD present. No lymphadenopathy.   CV: RRR, no m/r/g, no peripheral edema. Pulses intact, +2 bilaterally. No cyanosis, pallor or  clubbing. PULMONARY:  Unlabored, regular breathing. Scattered wheezes bilaterally A&P. No accessory muscle use.  GI: BS present and normoactive. Soft, non-tender to palpation. No organomegaly or masses detected. MSK: No erythema, warmth or tenderness.  No deformities or joint swelling noted.  Neuro: A/Ox3. No focal deficits noted.   Skin: Warm, no lesions or rashe Psych: Normal affect and behavior. Judgement and thought content appropriate.     Lab Results:  CBC    Component Value Date/Time   WBC 16.9 (H) 11/26/2020 1410   RBC 5.35 (H) 11/26/2020 1410   HGB 14.5 11/26/2020 1410   HGB 10.6 (L) 10/08/2019 0913   HCT 43.6 11/26/2020 1410   HCT 36.3 10/08/2019 0913   PLT 355.0 11/26/2020 1410   PLT 428 10/08/2019 0913   MCV 81.6 11/26/2020 1410   MCV 72 (L) 10/08/2019 0913   MCH 20.9 (  L) 10/08/2019 0913   MCH 16.2 (L) 01/12/2015 1552   MCHC 33.3 11/26/2020 1410   RDW 14.7 11/26/2020 1410   RDW 19.0 (H) 08/19/2019 1455   LYMPHSABS 0.8 11/26/2020 1410   MONOABS 0.7 11/26/2020 1410   EOSABS 0.0 11/26/2020 1410   BASOSABS 0.0 11/26/2020 1410    BMET    Component Value Date/Time   NA 138 03/18/2022 1359   K 3.8 03/18/2022 1359   CL 103 03/18/2022 1359   CO2 24 03/18/2022 1359   GLUCOSE 104 (H) 03/18/2022 1359   BUN 19 03/18/2022 1359   CREATININE 0.88 03/18/2022 1359   CALCIUM 9.8 03/18/2022 1359   GFRNONAA >90 08/08/2013 0355   GFRAA >90 08/08/2013 0355    BNP No results found for: "BNP"   Imaging:  DG Chest 2 View  Result Date: 03/18/2022 CLINICAL DATA:  Provided history: Post COVID. EXAM: CHEST - 2 VIEW COMPARISON:  Prior chest radiographs 11/25/2020 and earlier. FINDINGS: Heart size within normal limits. No appreciable airspace consolidation. No evidence of pleural effusion or pneumothorax. No acute bony abnormality identified. IMPRESSION: No evidence of active cardiopulmonary disease. Electronically Signed   By: Kellie Simmering D.O.   On: 03/18/2022 13:25     ipratropium-albuterol (DUONEB) 0.5-2.5 (3) MG/3ML nebulizer solution 3 mL     Date Action Dose Route User   03/18/2022 1343 Given 3 mL Nebulization Reyes Renderos, [place] L, LPN      methylPREDNISolone acetate (DEPO-MEDROL) injection 120 mg     Date Action Dose Route User   03/18/2022 1344 Given 120 mg Intramuscular (Right Ventrogluteal) Ronney Lion, Lorenso Courier, LPN          Latest Ref Rng & Units 11/25/2020    1:51 PM 02/24/2020   12:08 PM  PFT Results  FVC-Pre L 1.75  2.70   FVC-Predicted Pre % 44  69   FVC-Post L 1.61  2.52   FVC-Predicted Post % 41  64   Pre FEV1/FVC % % 85  75   Post FEV1/FCV % % 82  81   FEV1-Pre L 1.49  2.03   FEV1-Predicted Pre % 46  63   FEV1-Post L 1.32  2.03   DLCO uncorrected ml/min/mmHg 23.07  27.50   DLCO UNC% % 84  100   DLCO corrected ml/min/mmHg 23.07  27.50   DLCO COR %Predicted % 84  100   DLVA Predicted % 223  149   TLC L 4.40  4.62   TLC % Predicted % 72  75   RV % Predicted % 102  111     Lab Results  Component Value Date   NITRICOXIDE 80 03/01/2021        Assessment & Plan:   Allergic asthma Allergic asthma with acute exacerbation related to recent COVID infection. Unable to complete FeNO today. She has significant bronchospasm on exam. O2 stable on room air and non-toxic. Treated with duoneb and depo 120 mg inj x 1 in office. Some improvement after neb. Rx for extended prednisone taper and empiric doxycycline course for bronchitis. She's received numerous prednisone courses since she was seen last in 05/2021. Feels like she is on them every other month. Previous eosinophils 12/2019 were 600 eos. She just finished steroid burst 2 weeks ago. We will plan to recheck once off steroids for 4 weeks and then consider iitiating biologic therapy as she has been poorly controlled. She was agreeable to this plan. Continue high dose Advair. Encouraged her to use her neb 4x/day  until symptoms improve. Strict return/ED precautions.    Patient Instructions  Continue Albuterol inhaler 2 puffs or 3 mL neb every 6 hours as needed for shortness of breath or wheezing. Notify if symptoms persist despite rescue inhaler/neb use.  Continue advair 2 puffs Twice daily. Brush tongue and rinse mouth afterwards Continue cetirizine (zyrtec) 10 mg daily  Continue flonase nasal spray 2 sprays each nostril daily Continue singulair  (montelukast) 1 tab At bedtime   Prednisone taper. 4 tabs for 3 days, then 3 tabs for 3 days, 2 tabs for 3 days, then 1 tab for 3 days, then stop. Take in AM with food. Start tomorrow.  Doxycycline 1 tab Twice daily for 7 days. Take with food. Wear sunscreen when taking and avoid excessive sun exposure Mucinex [phone] mg Twice daily for chest congestion/cough  Labs today   Follow up in 2 weeks with [name] or Alanson Aly. If symptoms do not improve or worsen, please contact office for sooner follow up or seek emergency care.     Shortness of breath Recent COVID infection; unresolving DOE and cough. Although, I suspect this is related to persistent asthma exacerbation, given her symptoms, tachycardia, and increased immobility recently, we will obtain d dimer to rule out PE. See above plan regarding asthma.    I spent 45 minutes of dedicated to the care of this patient on the date of this encounter to include pre-visit review of records, face-to-face time with the patient discussing conditions above, post visit ordering of testing, clinical documentation with the electronic health record, making appropriate referrals as documented, and communicating necessary findings to members of the patients care team.  Clayton Bibles, NP 03/21/2022  Pt aware and understands NP's role.

## 2022-03-18 NOTE — Assessment & Plan Note (Signed)
Recent COVID infection; unresolving DOE and cough. Although, I suspect this is related to persistent asthma exacerbation, given her symptoms, tachycardia, and increased immobility recently, we will obtain d dimer to rule out PE. See above plan regarding asthma.

## 2022-03-18 NOTE — Assessment & Plan Note (Signed)
Allergic asthma with acute exacerbation related to recent COVID infection. Unable to complete FeNO today. She has significant bronchospasm on exam. O2 stable on room air and non-toxic. Treated with duoneb and depo 120 mg inj x 1 in office. Some improvement after neb. Rx for extended prednisone taper and empiric doxycycline course for bronchitis. She's received numerous prednisone courses since she was seen last in 05/2021. Feels like she is on them every other month. Previous eosinophils 12/2019 were 600 eos. She just finished steroid burst 2 weeks ago. We will plan to recheck once off steroids for 4 weeks and then consider iitiating biologic therapy as she has been poorly controlled. She was agreeable to this plan. Continue high dose Advair. Encouraged her to use her neb 4x/day until symptoms improve. Strict return/ED precautions.   Patient Instructions  Continue Albuterol inhaler 2 puffs or 3 mL neb every 6 hours as needed for shortness of breath or wheezing. Notify if symptoms persist despite rescue inhaler/neb use.  Continue advair 2 puffs Twice daily. Brush tongue and rinse mouth afterwards Continue cetirizine (zyrtec) 10 mg daily  Continue flonase nasal spray 2 sprays each nostril daily Continue singulair  (montelukast) 1 tab At bedtime   Prednisone taper. 4 tabs for 3 days, then 3 tabs for 3 days, 2 tabs for 3 days, then 1 tab for 3 days, then stop. Take in AM with food. Start tomorrow.  Doxycycline 1 tab Twice daily for 7 days. Take with food. Wear sunscreen when taking and avoid excessive sun exposure Mucinex [phone] mg Twice daily for chest congestion/cough  Labs today   Follow up in 2 weeks with [name] or Alanson Aly. If symptoms do not improve or worsen, please contact office for sooner follow up or seek emergency care.

## 2022-03-18 NOTE — Patient Instructions (Addendum)
Continue Albuterol inhaler 2 puffs or 3 mL neb every 6 hours as needed for shortness of breath or wheezing. Notify if symptoms persist despite rescue inhaler/neb use.  Continue advair 2 puffs Twice daily. Brush tongue and rinse mouth afterwards Continue cetirizine (zyrtec) 10 mg daily  Continue flonase nasal spray 2 sprays each nostril daily Continue singulair  (montelukast) 1 tab At bedtime   Prednisone taper. 4 tabs for 3 days, then 3 tabs for 3 days, 2 tabs for 3 days, then 1 tab for 3 days, then stop. Take in AM with food. Start tomorrow.  Doxycycline 1 tab Twice daily for 7 days. Take with food. Wear sunscreen when taking and avoid excessive sun exposure Mucinex [phone] mg Twice daily for chest congestion/cough  Labs today   Follow up in 2 weeks with [name] or Alanson Aly. If symptoms do not improve or worsen, please contact office for sooner follow up or seek emergency care.

## 2022-03-21 ENCOUNTER — Encounter: Payer: Self-pay | Admitting: Nurse Practitioner

## 2022-04-01 ENCOUNTER — Ambulatory Visit: Payer: BC Managed Care – PPO | Admitting: Nurse Practitioner

## 2022-04-18 ENCOUNTER — Ambulatory Visit: Payer: BC Managed Care – PPO | Admitting: Nurse Practitioner

## 2022-04-21 ENCOUNTER — Ambulatory Visit: Payer: BC Managed Care – PPO | Admitting: Nurse Practitioner

## 2022-04-28 ENCOUNTER — Ambulatory Visit (INDEPENDENT_AMBULATORY_CARE_PROVIDER_SITE_OTHER): Payer: BC Managed Care – PPO | Admitting: Nurse Practitioner

## 2022-04-28 ENCOUNTER — Encounter: Payer: Self-pay | Admitting: Nurse Practitioner

## 2022-04-28 VITALS — BP 132/78 | HR 96 | Ht 71.0 in | Wt 262.4 lb

## 2022-04-28 DIAGNOSIS — E669 Obesity, unspecified: Secondary | ICD-10-CM

## 2022-04-28 DIAGNOSIS — K219 Gastro-esophageal reflux disease without esophagitis: Secondary | ICD-10-CM

## 2022-04-28 DIAGNOSIS — R0683 Snoring: Secondary | ICD-10-CM | POA: Diagnosis not present

## 2022-04-28 DIAGNOSIS — J4540 Moderate persistent asthma, uncomplicated: Secondary | ICD-10-CM

## 2022-04-28 DIAGNOSIS — J309 Allergic rhinitis, unspecified: Secondary | ICD-10-CM | POA: Diagnosis not present

## 2022-04-28 DIAGNOSIS — R7989 Other specified abnormal findings of blood chemistry: Secondary | ICD-10-CM | POA: Diagnosis not present

## 2022-04-28 DIAGNOSIS — R058 Other specified cough: Secondary | ICD-10-CM | POA: Insufficient documentation

## 2022-04-28 LAB — BASIC METABOLIC PANEL
BUN: 14 mg/dL (ref 6–23)
CO2: 26 mEq/L (ref 19–32)
Calcium: 9.4 mg/dL (ref 8.4–10.5)
Chloride: 107 mEq/L (ref 96–112)
Creatinine, Ser: 0.91 mg/dL (ref 0.40–1.20)
GFR: 77.48 mL/min (ref >60.00)
Glucose, Bld: 88 mg/dL (ref 70–99)
Potassium: 3.9 mEq/L (ref 3.5–5.1)
Sodium: 137 mEq/L (ref 135–145)

## 2022-04-28 LAB — D-DIMER, QUANTITATIVE: D-Dimer, Quant: 0.43 mcg/mL FEU (ref <0.50)

## 2022-04-28 MED ORDER — FAMOTIDINE 20 MG PO TABS: 20.0000 mg | ORAL_TABLET | Freq: Two times a day (BID) | ORAL | 5 refills | Status: DC

## 2022-04-28 NOTE — Assessment & Plan Note (Signed)
BMI 36. Healthy weight loss measures encouraged. Reviewed correlation between reflux, sleep apnea, and obesity. Referral placed to medical weight management.

## 2022-04-28 NOTE — Assessment & Plan Note (Signed)
Her nocturnal and postprandial symptoms seem to be consistent with GERD. She will start famotidine twice daily. Will consider PPI therapy if symptoms remain poorly controlled.

## 2022-04-28 NOTE — Progress Notes (Signed)
$'@Patient'C$  ID: Kaitlyn Hays, female    DOB: [birth year], 41 y.o.   MRN: [id number]  Chief Complaint  Patient presents with   Follow-up    Pt f/u she is still having some issues w/ her asthma she reports coughing a lot and she feel like she wakes up at night choking. Her husbands reports that she snores as well.    Referring provider: No ref. provider found  HPI: 41 year old female, never smoker followed for allergic asthma and chronic cough. She is a patient of Dr. Golden Pop and last seen in office 03/18/2022 by [facility] NP. Past medical history significant for allergic rhinitis, GERD, IDA, fibroids.   TEST/EVENTS:  12/30/2019 eos 600 11/25/2020 PFT: FVC 44, FEV1 46, ratio 82, TLC 72, DLCOcor 84.  No BD 11/26/2020 eos 0, IgE 124  06/04/2021: OV with Volanda Napoleon NP for acute visit with cough.  Reports that she has had a chronic cough with associated hoarseness x1 month.  Cough is mostly dry.  Occasionally productive with clear-cloudy mucus.  Reports that she does cough after eating.  She also has a sore throat on the right side.  She has been compliant with Zyrtec and Singulair as prescribed.  She went to the ED on 05/14/2021 and received Solu-Medrol.  She has only been using Ventolin in the morning and ProAir in the evening.  She is not currently on maintenance inhaler.  She ran out of Advair 2 months ago.  Treated for acute exacerbation with Z-Pak and prednisone burst x5 days.  Advised her to resume maintenance therapy with Advair 232 puffs twice a day.   03/18/2022: OV with Germain Koopmann NP for acute visit.  She had tested positive 01/20/2022 for COVID.  Feels like she has had poor progress since.  Feels like her breathing has progressively gotten worse.  She was seen by her PCP a few weeks ago and treated with steroid burst which did not make much of a difference.  She also took a Z-Pak prior to this without change.  Allergic asthma with acute exacerbation related to recent COVID infection. Unable to complete  FeNO today. She has significant bronchospasm on exam. O2 stable on room air and non-toxic. Treated with duoneb and depo 120 mg inj x 1 in office. Some improvement after neb. Rx for extended prednisone taper and empiric doxycycline course for bronchitis. She's received numerous prednisone courses since she was seen last in 05/2021. Feels like she is on them every other month. Previous eosinophils 12/2019 were 600 eos. She just finished steroid burst 2 weeks ago. We will plan to recheck once off steroids for 4 weeks and then consider iitiating biologic therapy as she has been poorly controlled. She was agreeable to this plan. Continue high dose Advair. Encouraged her to use her neb 4x/day until symptoms improve. Strict return/ED precautions.  Recent COVID infection; unresolving DOE and cough. Although, I suspect this is related to persistent asthma exacerbation, given her symptoms, tachycardia, and increased immobility recently, we will obtain d dimer to rule out PE. See above plan regarding asthma.   04/28/2022: Today - follow up Patient presents today for overdue follow up. Her d dimer at last visit was positive. Unfortunately, we tried to contact her multiple ways but did not receive any answer. She never had CTA completed. Today, she tells me she is feeling better. Breathing doesn't feel as short and she hasn't noticed much wheezing since coming off last course of steroids and abx. Still has a cough, which  is occasionally productive with white sputum. She has some mid back pain when she coughs as well. Still has nasal congestion/drainage at times despite nasal spray and allergy medicine. She struggles with reflux symptoms as well. Worse at night; cough sometimes gets worse after eating. Sometimes feels like she's choking when she wakes up at night. She has also been told that she snores at night and occasionally stops breathing. Does feel tired during the day. She denies morning headaches, drowsy driving, sleep  parasomnias/paralysis. No history of narcolepsy or cataplexy. She denies dysphagia, abdominal pain, changes in bowel habits, orthopnea, leg swelling. No hemoptysis, calf pain or swelling. She is using Advair twice daily. Cut back on albuterol use; 1-2 times a week. She's taking zyrtec, flonase, and singulair. No current regimen for reflux.   No Known Allergies  Immunization History  Administered Date(s) Administered   PFIZER(Purple Top)SARS-COV-2 Vaccination 02/21/2020, 03/19/2020    Past Medical History:  Diagnosis Date   Anemia    Asthma    Bronchitis    Fibroids    Gonorrhea    Tibia fracture 08/07/2013    Tobacco History: Social History   Tobacco Use  Smoking Status Never  Smokeless Tobacco Never   Counseling given: Not Answered   Outpatient Medications Prior to Visit  Medication Sig Dispense Refill   albuterol (PROVENTIL) (2.5 MG/3ML) 0.083% nebulizer solution Take 3 mLs (2.5 mg total) by nebulization every 6 (six) hours as needed for wheezing or shortness of breath. 75 mL 3   albuterol (VENTOLIN HFA) 108 (90 Base) MCG/ACT inhaler Inhale 1-2 puffs into the lungs every 6 (six) hours as needed for wheezing or shortness of breath. 8 g 5   cetirizine (ZYRTEC ALLERGY) 10 MG tablet Take 1 tablet (10 mg total) by mouth daily. 30 tablet 2   doxycycline (VIBRA-TABS) 100 MG tablet Take 1 tablet (100 mg total) by mouth 2 (two) times daily. 14 tablet 0   fluticasone (FLONASE) 50 MCG/ACT nasal spray Place 1 spray into both nostrils daily. 11.1 mL 2   fluticasone-salmeterol (ADVAIR HFA) 230-21 MCG/ACT inhaler Inhale 2 puffs into the lungs 2 (two) times daily. 1 each 11   hydrochlorothiazide (HYDRODIURIL) 25 MG tablet Take 25 mg by mouth daily.     megestrol (MEGACE) 40 MG tablet Take 1 tablet (40 mg total) by mouth 2 (two) times daily. Can increase to two tablets twice a day in the event of heavy bleeding 60 tablet 5   montelukast (SINGULAIR) 10 MG tablet TAKE 1 TABLET(10 MG) BY MOUTH AT  BEDTIME 30 tablet 11   predniSONE (DELTASONE) 10 MG tablet 4 tabs for 3 days, then 3 tabs for 3 days, 2 tabs for 3 days, then 1 tab for 3 days, then stop 30 tablet 0   cyclobenzaprine (FLEXERIL) 10 MG tablet Take 10 mg by mouth 3 (three) times daily as needed for muscle spasms. As needed (Patient not taking: Reported on 04/28/2022)     naproxen (NAPROSYN) 500 MG tablet Take 500 mg by mouth 2 (two) times daily with a meal. (Patient not taking: Reported on 04/28/2022)     Facility-Administered Medications Prior to Visit  Medication Dose Route Frequency Provider Last Rate Last Admin   ipratropium-albuterol (DUONEB) 0.5-2.5 (3) MG/3ML nebulizer solution 3 mL  3 mL Nebulization Daily PRN Camellia Popescu, Karie Schwalbe, NP   3 mL at 03/18/22 1343     Review of Systems:   Constitutional: No weight loss or gain, night sweats, fevers, chills, lassitude +fatigue HEENT: No headaches,  difficulty swallowing, tooth/dental problems, or sore throat. No sneezing, itching, ear ache, nasal congestion, or post nasal drip CV:  No chest pain, orthopnea, PND, swelling in lower extremities, anasarca, dizziness, palpitations, syncope Resp: +cough; chest discomfort; improved DOE; snoring; witnessed nocturnal apneas. No hemoptysis. No wheezing. No chest wall deformity GI:  +heartburn, indigestion. No abdominal pain, nausea, vomiting, diarrhea, change in bowel habits, loss of appetite, bloody stools.  Skin: No rash, lesions, ulcerations MSK:  No joint pain or swelling.  No decreased range of motion.  No back pain. Neuro: No dizziness or lightheadedness.  Psych: No depression or anxiety. Mood stable. +sleep disturbance    Physical Exam:  BP 132/78   Pulse 96   Ht '5\' 11"'$  (1.803 m)   Wt 262 lb 6.4 oz (119 kg)   SpO2 99%   BMI 36.60 kg/m   GEN: Pleasant, interactive, well-appearing; obese; in no acute distress. HEENT:  Normocephalic and atraumatic. PERRLA. Sclera white. Nasal turbinates pink, moist and patent bilaterally. No  rhinorrhea present. Oropharynx pink and moist, without exudate or edema. No lesions, ulcerations, or postnasal drip.  NECK:  Supple w/ fair ROM. No JVD present. No lymphadenopathy.   CV: RRR, no m/r/g, no peripheral edema. Pulses intact, +2 bilaterally. No cyanosis, pallor or clubbing. PULMONARY:  Unlabored, regular breathing. Clear bilaterally A&P w/o wheezes/rales/rhonchi. No accessory muscle use.  GI: BS present and normoactive. Soft, non-tender to palpation. No organomegaly or masses detected. MSK: No erythema, warmth or tenderness.  No deformities or joint swelling noted.  Neuro: A/Ox3. No focal deficits noted.   Skin: Warm, no lesions or rashes Psych: Normal affect and behavior. Judgement and thought content appropriate.     Lab Results:  CBC    Component Value Date/Time   WBC 16.9 (H) 11/26/2020 1410   RBC 5.35 (H) 11/26/2020 1410   HGB 14.5 11/26/2020 1410   HGB 10.6 (L) 10/08/2019 0913   HCT 43.6 11/26/2020 1410   HCT 36.3 10/08/2019 0913   PLT 355.0 11/26/2020 1410   PLT 428 10/08/2019 0913   MCV 81.6 11/26/2020 1410   MCV 72 (L) 10/08/2019 0913   MCH 20.9 (L) 10/08/2019 0913   MCH 16.2 (L) 01/12/2015 1552   MCHC 33.3 11/26/2020 1410   RDW 14.7 11/26/2020 1410   RDW 19.0 (H) 08/19/2019 1455   LYMPHSABS 0.8 11/26/2020 1410   MONOABS 0.7 11/26/2020 1410   EOSABS 0.0 11/26/2020 1410   BASOSABS 0.0 11/26/2020 1410    BMET    Component Value Date/Time   NA 137 04/28/2022 0945   K 3.9 04/28/2022 0945   CL 107 04/28/2022 0945   CO2 26 04/28/2022 0945   GLUCOSE 88 04/28/2022 0945   BUN 14 04/28/2022 0945   CREATININE 0.91 04/28/2022 0945   CALCIUM 9.4 04/28/2022 0945   GFRNONAA >90 08/08/2013 0355   GFRAA >90 08/08/2013 0355    BNP No results found for: "BNP"   Imaging:  No results found.  ipratropium-albuterol (DUONEB) 0.5-2.5 (3) MG/3ML nebulizer solution 3 mL     Date Action Dose Route User   03/18/2022 1343 Given 3 mL Nebulization Reyes Renderos,  Rhodes L, LPN      methylPREDNISolone acetate (DEPO-MEDROL) injection 120 mg     Date Action Dose Route User   03/18/2022 1344 Given 120 mg Intramuscular (Right Ventrogluteal) Ronney Lion, Lorenso Courier, LPN          Latest Ref Rng & Units 11/25/2020    1:51 PM 02/24/2020   12:08 PM  PFT Results  FVC-Pre L 1.75  2.70   FVC-Predicted Pre % 44  69   FVC-Post L 1.61  2.52   FVC-Predicted Post % 41  64   Pre FEV1/FVC % % 85  75   Post FEV1/FCV % % 82  81   FEV1-Pre L 1.49  2.03   FEV1-Predicted Pre % 46  63   FEV1-Post L 1.32  2.03   DLCO uncorrected ml/min/mmHg 23.07  27.50   DLCO UNC% % 84  100   DLCO corrected ml/min/mmHg 23.07  27.50   DLCO COR %Predicted % 84  100   DLVA Predicted % 223  149   TLC L 4.40  4.62   TLC % Predicted % 72  75   RV % Predicted % 102  111     Lab Results  Component Value Date   NITRICOXIDE 80 03/01/2021        Assessment & Plan:   Allergic asthma without complication Resolved exacerbation. Clinically improved today. Lung exam clear. Attempted FeNO at last visit, which she was unable to complete. She will continue on ICS/LABA therapy and singulair for trigger prevention. Persistent cough seems to be related more so to upper airway irritation from postnasal drip and GERD. Asthma action plan in place.  Patient Instructions  Continue Albuterol inhaler 2 puffs or 3 mL neb every 6 hours as needed for shortness of breath or wheezing. Notify if symptoms persist despite rescue inhaler/neb use.  Continue advair 2 puffs Twice daily. Brush tongue and rinse mouth afterwards Continue nasocort nasal spray 2 sprays each nostril daily Continue singulair  (montelukast) 1 tab At bedtime   Try changing cetirizine (Zyrtec) to Xyzal, Claritin or allegra to see if you have better response.  Saline nasal irrigations 1-2 times a day before nasocort Famotidine 20 mg Twice daily; 30 min before breakfast in the AM and 30 min before bedtime  Chlorpheniramine 4 mg  tab over the counter every 6 hours for cough/drainage (may cause drowsiness so ok to only use at bedtime if needed) Delsym 2 tsp Twice daily for cough  Avoid eating 2-3 hours before laying down. Elevate head of bed while sleeping at night. Try a side lying sleeping position as well.   Home sleep study ordered today    Labs today   Upper airway cough syndrome: Limit talking for the next few days. Avoid throat clearing. Work on cough suppression with the above recommended suppressants.  Use sugar free hard candies or non-menthol cough drops during this time to soothe your throat.  Warm tea with honey and lemon.    Follow up in 6 weeks with [name] or Alanson Aly. If symptoms do not improve or worsen, please contact office for sooner follow up or seek emergency care.    Upper airway cough syndrome Cough seems to be related to upper airway irritation from postnasal drainage and GERD. Target contributing factors and cough control measures. See above.  Allergic rhinitis Chronic rhinitis; likely allergic with history of elevated eosinophils. She is currently on flonase, zyrtec and singulair. Add on saline nasal irrigations. Check RAST today; if significant environmental triggers, we will send referral to allergy for further recommendations.   GERD (gastroesophageal reflux disease) Her nocturnal and postprandial symptoms seem to be consistent with GERD. She will start famotidine twice daily. Will consider PPI therapy if symptoms remain poorly controlled.   Snoring She has snoring, excessive daytime sleepiness, nocturnal apneic events. BMI 36. Given this,  I am concerned  she could have sleep disordered breathing with obstructive sleep apnea. She will need sleep study for further evaluation.    - discussed how weight can impact sleep and risk for sleep disordered breathing - discussed options to assist with weight loss: combination of diet modification, cardiovascular and strength training  exercises   - had an extensive discussion regarding the adverse health consequences related to untreated sleep disordered breathing - specifically discussed the risks for hypertension, coronary artery disease, cardiac dysrhythmias, cerebrovascular disease, and diabetes - lifestyle modification discussed   - discussed how sleep disruption can increase risk of accidents, particularly when driving - safe driving practices were discussed   Obesity (BMI 30-39.9) BMI 36. Healthy weight loss measures encouraged. Reviewed correlation between reflux, sleep apnea, and obesity. Referral placed to medical weight management.   Positive D dimer At previous OV, she had persistent SOB, cough, pleuritic pain, tachycardia, recent COVID infection and immobilization. D dimer was checked, which was positive. We had difficulties reaching her so she never attended STAT CTA. She has clinically improved. Still experiencing some pleuritic pain, mainly with coughing. Recheck d dimer today. If this is still positive, she will need further evaluation with imaging. She verbalized understanding. Ensured correct contact information was in her chart.     I spent 45 minutes of dedicated to the care of this patient on the date of this encounter to include pre-visit review of records, face-to-face time with the patient discussing conditions above, post visit ordering of testing, clinical documentation with the electronic health record, making appropriate referrals as documented, and communicating necessary findings to members of the patients care team.  Clayton Bibles, NP 04/28/2022  Pt aware and understands NP's role.

## 2022-04-28 NOTE — Patient Instructions (Addendum)
Continue Albuterol inhaler 2 puffs or 3 mL neb every 6 hours as needed for shortness of breath or wheezing. Notify if symptoms persist despite rescue inhaler/neb use.  Continue advair 2 puffs Twice daily. Brush tongue and rinse mouth afterwards Continue nasocort nasal spray 2 sprays each nostril daily Continue singulair  (montelukast) 1 tab At bedtime   Try changing cetirizine (Zyrtec) to Xyzal, Claritin or allegra to see if you have better response.  Saline nasal irrigations 1-2 times a day before nasocort Famotidine 20 mg Twice daily; 30 min before breakfast in the AM and 30 min before bedtime  Chlorpheniramine 4 mg tab over the counter every 6 hours for cough/drainage (may cause drowsiness so ok to only use at bedtime if needed) Delsym 2 tsp Twice daily for cough  Avoid eating 2-3 hours before laying down. Elevate head of bed while sleeping at night. Try a side lying sleeping position as well.   Home sleep study ordered today    Labs today   Upper airway cough syndrome: Limit talking for the next few days. Avoid throat clearing. Work on cough suppression with the above recommended suppressants.  Use sugar free hard candies or non-menthol cough drops during this time to soothe your throat.  Warm tea with honey and lemon.    Follow up in 6 weeks with [name] or Alanson Aly. If symptoms do not improve or worsen, please contact office for sooner follow up or seek emergency care.

## 2022-04-28 NOTE — Assessment & Plan Note (Addendum)
Cough seems to be related to upper airway irritation from postnasal drainage and GERD. Target contributing factors and cough control measures. See above.

## 2022-04-28 NOTE — Assessment & Plan Note (Signed)
She has snoring, excessive daytime sleepiness, nocturnal apneic events. BMI 36. Given this,  I am concerned she could have sleep disordered breathing with obstructive sleep apnea. She will need sleep study for further evaluation.    - discussed how weight can impact sleep and risk for sleep disordered breathing - discussed options to assist with weight loss: combination of diet modification, cardiovascular and strength training exercises   - had an extensive discussion regarding the adverse health consequences related to untreated sleep disordered breathing - specifically discussed the risks for hypertension, coronary artery disease, cardiac dysrhythmias, cerebrovascular disease, and diabetes - lifestyle modification discussed   - discussed how sleep disruption can increase risk of accidents, particularly when driving - safe driving practices were discussed

## 2022-04-28 NOTE — Assessment & Plan Note (Signed)
Resolved exacerbation. Clinically improved today. Lung exam clear. Attempted FeNO at last visit, which she was unable to complete. She will continue on ICS/LABA therapy and singulair for trigger prevention. Persistent cough seems to be related more so to upper airway irritation from postnasal drip and GERD. Asthma action plan in place.  Patient Instructions  Continue Albuterol inhaler 2 puffs or 3 mL neb every 6 hours as needed for shortness of breath or wheezing. Notify if symptoms persist despite rescue inhaler/neb use.  Continue advair 2 puffs Twice daily. Brush tongue and rinse mouth afterwards Continue nasocort nasal spray 2 sprays each nostril daily Continue singulair  (montelukast) 1 tab At bedtime   Try changing cetirizine (Zyrtec) to Xyzal, Claritin or allegra to see if you have better response.  Saline nasal irrigations 1-2 times a day before nasocort Famotidine 20 mg Twice daily; 30 min before breakfast in the AM and 30 min before bedtime  Chlorpheniramine 4 mg tab over the counter every 6 hours for cough/drainage (may cause drowsiness so ok to only use at bedtime if needed) Delsym 2 tsp Twice daily for cough  Avoid eating 2-3 hours before laying down. Elevate head of bed while sleeping at night. Try a side lying sleeping position as well.   Home sleep study ordered today    Labs today   Upper airway cough syndrome: Limit talking for the next few days. Avoid throat clearing. Work on cough suppression with the above recommended suppressants.  Use sugar free hard candies or non-menthol cough drops during this time to soothe your throat.  Warm tea with honey and lemon.    Follow up in 6 weeks with [name] or Alanson Aly. If symptoms do not improve or worsen, please contact office for sooner follow up or seek emergency care.

## 2022-04-28 NOTE — Assessment & Plan Note (Signed)
At previous OV, she had persistent SOB, cough, pleuritic pain, tachycardia, recent COVID infection and immobilization. D dimer was checked, which was positive. We had difficulties reaching her so she never attended STAT CTA. She has clinically improved. Still experiencing some pleuritic pain, mainly with coughing. Recheck d dimer today. If this is still positive, she will need further evaluation with imaging. She verbalized understanding. Ensured correct contact information was in her chart.

## 2022-04-28 NOTE — Assessment & Plan Note (Signed)
Chronic rhinitis; likely allergic with history of elevated eosinophils. She is currently on flonase, zyrtec and singulair. Add on saline nasal irrigations. Check RAST today; if significant environmental triggers, we will send referral to allergy for further recommendations.

## 2022-04-29 ENCOUNTER — Encounter: Payer: Self-pay | Admitting: Nurse Practitioner

## 2022-05-02 LAB — ALLERGEN PANEL (27) + IGE
Alternaria Alternata IgE: <0.1 kU/L
Aspergillus Fumigatus IgE: <0.1 kU/L
Bahia Grass IgE: <0.1 kU/L
Bermuda Grass IgE: <0.1 kU/L
Cat Dander IgE: <0.1 kU/L
Cedar, Mountain IgE: <0.1 kU/L
Cladosporium Herbarum IgE: <0.1 kU/L
Cocklebur IgE: <0.1 kU/L
Cockroach, American IgE: <0.1 kU/L
Common Silver Birch IgE: <0.1 kU/L
D Farinae IgE: <0.1 kU/L
D Pteronyssinus IgE: <0.1 kU/L
Dog Dander IgE: <0.1 kU/L
Elm, American IgE: <0.1 kU/L
Hickory, White IgE: <0.1 kU/L
IgE (Immunoglobulin E), Serum: 65 IU/mL (ref 6–495)
Johnson Grass IgE: <0.1 kU/L
Kentucky Bluegrass IgE: <0.1 kU/L
Maple/Box Elder IgE: <0.1 kU/L
Mucor Racemosus IgE: <0.1 kU/L
Oak, White IgE: <0.1 kU/L
Penicillium Chrysogen IgE: <0.1 kU/L
Pigweed, Rough IgE: <0.1 kU/L
Plantain, English IgE: <0.1 kU/L
Ragweed, Short IgE: <0.1 kU/L
Setomelanomma Rostrat: <0.1 kU/L
Timothy Grass IgE: <0.1 kU/L
White Mulberry IgE: <0.1 kU/L

## 2022-05-02 NOTE — Progress Notes (Signed)
Labs were all nl. Allergen panel negative

## 2022-05-28 ENCOUNTER — Other Ambulatory Visit: Payer: Self-pay | Admitting: Primary Care

## 2022-06-09 ENCOUNTER — Encounter: Payer: Self-pay | Admitting: Nurse Practitioner

## 2022-06-09 ENCOUNTER — Ambulatory Visit (INDEPENDENT_AMBULATORY_CARE_PROVIDER_SITE_OTHER): Payer: BC Managed Care – PPO | Admitting: Nurse Practitioner

## 2022-06-09 VITALS — BP 144/92 | HR 101 | Ht 71.0 in | Wt 262.0 lb

## 2022-06-09 DIAGNOSIS — J309 Allergic rhinitis, unspecified: Secondary | ICD-10-CM

## 2022-06-09 DIAGNOSIS — J4541 Moderate persistent asthma with (acute) exacerbation: Secondary | ICD-10-CM

## 2022-06-09 MED ORDER — AZITHROMYCIN 250 MG PO TABS: ORAL_TABLET | ORAL | 0 refills | Status: DC

## 2022-06-09 MED ORDER — PREDNISONE 10 MG PO TABS: ORAL_TABLET | ORAL | 0 refills | Status: DC

## 2022-06-09 MED ORDER — PROMETHAZINE-DM 6.25-15 MG/5ML PO SYRP: 5.0000 mL | ORAL_SOLUTION | Freq: Four times a day (QID) | ORAL | 0 refills | Status: DC | PRN

## 2022-06-09 NOTE — Patient Instructions (Addendum)
Continue Albuterol inhaler 2 puffs or 3 mL neb every 6 hours as needed for shortness of breath or wheezing. Notify if symptoms persist despite rescue inhaler/neb use.  Continue advair 2 puffs Twice daily. Brush tongue and rinse mouth afterwards Continue nasocort nasal spray 2 sprays each nostril daily Continue singulair  (montelukast) 1 tab At bedtime  Restart Saline nasal irrigations 1-2 times a day before nasocort Continue alternating between zyrtec and claritin  Continue Famotidine 20 mg Twice daily; 30 min before breakfast in the AM and 30 min before bedtime   Chlorpheniramine 4 mg tab over the counter every 6 hours for cough/drainage (may cause drowsiness so ok to only use at bedtime if needed) Promethazine DM cough syrup 5 mL every 6 hours as needed for cough. May cause drowsiness. Do not drive after taking. Do not take delsym if you take this cough syrup as it contains the same medication Prednisone taper. 4 tabs for 2 days, then 3 tabs for 2 days, 2 tabs for 2 days, then 1 tab for 2 days, then stop. Take in AM with food Azithromycin (z pack) - take 2 tablets on day one then one tablet daily for four additional days. Take with food.    Avoid throat clearing. Work on cough suppression with the above recommended suppressants.  Use sugar free hard candies or non-menthol cough drops during this time to soothe your throat.  Warm tea with honey and lemon.    Follow up in 2 weeks with [name] or Alanson Aly. If symptoms do not improve or worsen, please contact office for sooner follow up or seek emergency care.

## 2022-06-09 NOTE — Assessment & Plan Note (Signed)
See above plan. 

## 2022-06-09 NOTE — Progress Notes (Signed)
$'@Patient'M$  ID: Kaitlyn Hays, female    DOB: Jun 25, 1980, 41 y.o.   MRN: [id number]  Chief Complaint  Patient presents with   Follow-up    Pt f/u she reports that she is coughing, wheezing, having some chest pains, and headaches    Referring provider: No ref. provider found  HPI: 41 year old female, never smoker followed for allergic asthma and chronic cough. She is a patient of Dr. Golden Pop and last seen in office 04/28/2022 by Belenda Cruise NP. Past medical history significant for allergic rhinitis, GERD, IDA, fibroids.   TEST/EVENTS:  12/30/2019 eos 600 11/25/2020 PFT: FVC 44, FEV1 46, ratio 82, TLC 72, DLCOcor 84.  No BD 11/26/2020 eos 0, IgE 124  06/04/2021: OV with Volanda Napoleon NP for acute visit with cough.  Reports that she has had a chronic cough with associated hoarseness x1 month.  Cough is mostly dry.  Occasionally productive with clear-cloudy mucus.  Reports that she does cough after eating.  She also has a sore throat on the right side.  She has been compliant with Zyrtec and Singulair as prescribed.  She went to the ED on 05/14/2021 and received Solu-Medrol.  She has only been using Ventolin in the morning and ProAir in the evening.  She is not currently on maintenance inhaler.  She ran out of Advair 2 months ago.  Treated for acute exacerbation with Z-Pak and prednisone burst x5 days.  Advised her to resume maintenance therapy with Advair 232 puffs twice a day.   03/18/2022: OV with Moe Graca NP for acute visit.  She had tested positive 01/20/2022 for COVID.  Feels like she has had poor progress since.  Feels like her breathing has progressively gotten worse.  She was seen by her PCP a few weeks ago and treated with steroid burst which did not make much of a difference.  She also took a Z-Pak prior to this without change.  Allergic asthma with acute exacerbation related to recent COVID infection. Unable to complete FeNO today. She has significant bronchospasm on exam. O2 stable on room air and  non-toxic. Treated with duoneb and depo 120 mg inj x 1 in office. Some improvement after neb. Rx for extended prednisone taper and empiric doxycycline course for bronchitis. She's received numerous prednisone courses since she was seen last in 05/2021. Feels like she is on them every other month. Previous eosinophils 12/2019 were 600 eos. She just finished steroid burst 2 weeks ago. We will plan to recheck once off steroids for 4 weeks and then consider iitiating biologic therapy as she has been poorly controlled. She was agreeable to this plan. Continue high dose Advair. Encouraged her to use her neb 4x/day until symptoms improve. Strict return/ED precautions.  Recent COVID infection; unresolving DOE and cough. Although, I suspect this is related to persistent asthma exacerbation, given her symptoms, tachycardia, and increased immobility recently, we will obtain d dimer to rule out PE. See above plan regarding asthma.   04/28/2022: OV with Linzey Ramser NP for overdue follow up. Her d dimer at last visit was positive. Unfortunately, we tried to contact her multiple ways but did not receive any answer. She never had CTA completed. Today, she tells me she is feeling better. Breathing doesn't feel as short and she hasn't noticed much wheezing since coming off last course of steroids and abx. Still has a cough, which is occasionally productive with white sputum. She has some mid back pain when she coughs as well. Still has nasal congestion/drainage at  times despite nasal spray and allergy medicine. She struggles with reflux symptoms as well. Worse at night; cough sometimes gets worse after eating. Sometimes feels like she's choking when she wakes up at night. She has also been told that she snores at night and occasionally stops breathing. Does feel tired during the day. She denies morning headaches, drowsy driving, sleep parasomnias/paralysis. No history of narcolepsy or cataplexy. She denies dysphagia, abdominal pain,  changes in bowel habits, orthopnea, leg swelling. No hemoptysis, calf pain or swelling. She is using Advair twice daily. Cut back on albuterol use; 1-2 times a week. She's taking zyrtec, flonase, and singulair. No current regimen for reflux.   06/09/2022: Today - acute Patient presents today for acute visit. She has been having trouble with a cough for the past two weeks now, producing some clear phlegm which she feels like could be coming from her sinuses as she's also having more nasal congestion. She has been wheezing more, having chest tightness and some discomfort with coughing. She does feel more short of breath when she has coughing spells but otherwise, her breathing is stable. Cough gets worse at night. She denies any fevers, chills, hemoptysis. No known sick exposures. Eating and drinking well. She is using her advair twice daily. Using nasocort nasal spray. She was previously doing nasal rinses but ran out of the neil med rinse so she has stopped these. Alternating claritin and zyrtec and taking singulair at night.   No Known Allergies  Immunization History  Administered Date(s) Administered   PFIZER(Purple Top)SARS-COV-2 Vaccination 02/21/2020, 03/19/2020    Past Medical History:  Diagnosis Date   Anemia    Asthma    Bronchitis    Fibroids    Gonorrhea    Tibia fracture 08/07/2013    Tobacco History: Social History   Tobacco Use  Smoking Status Never  Smokeless Tobacco Never   Counseling given: Not Answered   Outpatient Medications Prior to Visit  Medication Sig Dispense Refill   albuterol (PROVENTIL) (2.5 MG/3ML) 0.083% nebulizer solution Take 3 mLs (2.5 mg total) by nebulization every 6 (six) hours as needed for wheezing or shortness of breath. 75 mL 3   albuterol (VENTOLIN HFA) 108 (90 Base) MCG/ACT inhaler Inhale 1-2 puffs into the lungs every 6 (six) hours as needed for wheezing or shortness of breath. 8 g 5   cetirizine (ZYRTEC ALLERGY) 10 MG tablet Take 1 tablet  (10 mg total) by mouth daily. 30 tablet 2   famotidine (PEPCID) 20 MG tablet Take 1 tablet (20 mg total) by mouth 2 (two) times daily. 60 tablet 5   fluticasone-salmeterol (ADVAIR HFA) 230-21 MCG/ACT inhaler Inhale 2 puffs into the lungs 2 (two) times daily. 1 each 11   megestrol (MEGACE) 40 MG tablet Take 1 tablet (40 mg total) by mouth 2 (two) times daily. Can increase to two tablets twice a day in the event of heavy bleeding 60 tablet 5   montelukast (SINGULAIR) 10 MG tablet TAKE 1 TABLET(10 MG) BY MOUTH AT BEDTIME 30 tablet 11   cyclobenzaprine (FLEXERIL) 10 MG tablet Take 10 mg by mouth 3 (three) times daily as needed for muscle spasms. As needed (Patient not taking: Reported on 04/28/2022)     hydrochlorothiazide (HYDRODIURIL) 25 MG tablet Take 25 mg by mouth daily. (Patient not taking: Reported on 06/09/2022)     doxycycline (VIBRA-TABS) 100 MG tablet Take 1 tablet (100 mg total) by mouth 2 (two) times daily. 14 tablet 0   fluticasone (FLONASE) 50 MCG/ACT  nasal spray Place 1 spray into both nostrils daily. 11.1 mL 2   Facility-Administered Medications Prior to Visit  Medication Dose Route Frequency Provider Last Rate Last Admin   ipratropium-albuterol (DUONEB) 0.5-2.5 (3) MG/3ML nebulizer solution 3 mL  3 mL Nebulization Daily PRN Averyanna Sax, Karie Schwalbe, NP   3 mL at 03/18/22 1343     Review of Systems:   Constitutional: No weight loss or gain, night sweats, fevers, chills, lassitude +fatigue HEENT: No headaches, difficulty swallowing, tooth/dental problems, or sore throat. No sneezing, itching, ear ache. +nasal congestion, post nasal drip CV:  No chest pain, orthopnea, PND, swelling in lower extremities, anasarca, dizziness, palpitations, syncope Resp: +cough; chest discomfort with cough; SOB with coughing spells; wheezing. No hemoptysis. No chest wall deformity GI:  No heartburn, indigestion, abdominal pain, nausea, vomiting Skin: No rash, lesions, ulcerations MSK:  No joint pain or  swelling.   Neuro: No dizziness or lightheadedness.  Psych: No depression or anxiety. Mood stable    Physical Exam:  BP (!) 144/92   Pulse (!) 101   Ht '5\' 11"'$  (1.803 m)   Wt 262 lb (118.8 kg)   SpO2 98%   BMI 36.54 kg/m   GEN: Pleasant, interactive, well-appearing; obese; in no acute distress. HEENT:  Normocephalic and atraumatic. PERRLA. Sclera white. Nasal turbinates erythematous, moist and patent bilaterally. No rhinorrhea present. Oropharynx pink and moist, without exudate or edema. No lesions, ulcerations, or postnasal drip.  NECK:  Supple w/ fair ROM. No JVD present. No lymphadenopathy.   CV: RRR, no m/r/g, no peripheral edema. Pulses intact, +2 bilaterally. No cyanosis, pallor or clubbing. PULMONARY:  Unlabored, regular breathing. Minimal expiratory wheezes bilaterally A&P. No accessory muscle use.  GI: BS present and normoactive. Soft, non-tender to palpation. No organomegaly or masses detected. MSK: No erythema, warmth or tenderness.  No deformities or joint swelling noted.  Neuro: A/Ox3. No focal deficits noted.   Skin: Warm, no lesions or rashes Psych: Normal affect and behavior. Judgement and thought content appropriate.     Lab Results:  CBC    Component Value Date/Time   WBC 16.9 (H) 11/26/2020 1410   RBC 5.35 (H) 11/26/2020 1410   HGB 14.5 11/26/2020 1410   HGB 10.6 (L) 10/08/2019 0913   HCT 43.6 11/26/2020 1410   HCT 36.3 10/08/2019 0913   PLT 355.0 11/26/2020 1410   PLT 428 10/08/2019 0913   MCV 81.6 11/26/2020 1410   MCV 72 (L) 10/08/2019 0913   MCH 20.9 (L) 10/08/2019 0913   MCH 16.2 (L) 01/12/2015 1552   MCHC 33.3 11/26/2020 1410   RDW 14.7 11/26/2020 1410   RDW 19.0 (H) 08/19/2019 1455   LYMPHSABS 0.8 11/26/2020 1410   MONOABS 0.7 11/26/2020 1410   EOSABS 0.0 11/26/2020 1410   BASOSABS 0.0 11/26/2020 1410    BMET    Component Value Date/Time   NA 137 04/28/2022 0945   K 3.9 04/28/2022 0945   CL 107 04/28/2022 0945   CO2 26 04/28/2022  0945   GLUCOSE 88 04/28/2022 0945   BUN 14 04/28/2022 0945   CREATININE 0.91 04/28/2022 0945   CALCIUM 9.4 04/28/2022 0945   GFRNONAA >90 08/08/2013 0355   GFRAA >90 08/08/2013 0355    BNP No results found for: "BNP"   Imaging:  No results found.        Latest Ref Rng & Units 11/25/2020    1:51 PM 02/24/2020   12:08 PM  PFT Results  FVC-Pre L 1.75  2.70  FVC-Predicted Pre % 44  69   FVC-Post L 1.61  2.52   FVC-Predicted Post % 41  64   Pre FEV1/FVC % % 85  75   Post FEV1/FCV % % 82  81   FEV1-Pre L 1.49  2.03   FEV1-Predicted Pre % 46  63   FEV1-Post L 1.32  2.03   DLCO uncorrected ml/min/mmHg 23.07  27.50   DLCO UNC% % 84  100   DLCO corrected ml/min/mmHg 23.07  27.50   DLCO COR %Predicted % 84  100   DLVA Predicted % 223  149   TLC L 4.40  4.62   TLC % Predicted % 72  75   RV % Predicted % 102  111     Lab Results  Component Value Date   NITRICOXIDE 80 03/01/2021        Assessment & Plan:   Moderate persistent asthmatic bronchitis with exacerbation Asthmatic bronchitis with acute exacerbation, likely related to URI. I suspect there is a component of upper airway irritation contributing to her cough as well. We will treat her with prednisone taper and empiric azithromycin. Target cough and postnasal drainage. Continue ICS/LABA therapy. Asthma action plan in place.   Patient Instructions  Continue Albuterol inhaler 2 puffs or 3 mL neb every 6 hours as needed for shortness of breath or wheezing. Notify if symptoms persist despite rescue inhaler/neb use.  Continue advair 2 puffs Twice daily. Brush tongue and rinse mouth afterwards Continue nasocort nasal spray 2 sprays each nostril daily Continue singulair  (montelukast) 1 tab At bedtime  Restart Saline nasal irrigations 1-2 times a day before nasocort Continue alternating between zyrtec and claritin  Continue Famotidine 20 mg Twice daily; 30 min before breakfast in the AM and 30 min before bedtime    Chlorpheniramine 4 mg tab over the counter every 6 hours for cough/drainage (may cause drowsiness so ok to only use at bedtime if needed) Promethazine DM cough syrup 5 mL every 6 hours as needed for cough. May cause drowsiness. Do not drive after taking. Do not take delsym if you take this cough syrup as it contains the same medication Prednisone taper. 4 tabs for 2 days, then 3 tabs for 2 days, 2 tabs for 2 days, then 1 tab for 2 days, then stop. Take in AM with food Azithromycin (z pack) - take 2 tablets on day one then one tablet daily for four additional days. Take with food.    Avoid throat clearing. Work on cough suppression with the above recommended suppressants.  Use sugar free hard candies or non-menthol cough drops during this time to soothe your throat.  Warm tea with honey and lemon.    Follow up in 2 weeks with [name] or Alanson Aly. If symptoms do not improve or worsen, please contact office for sooner follow up or seek emergency care.    Allergic rhinitis See above plan    I spent 35 minutes of dedicated to the care of this patient on the date of this encounter to include pre-visit review of records, face-to-face time with the patient discussing conditions above, post visit ordering of testing, clinical documentation with the electronic health record, making appropriate referrals as documented, and communicating necessary findings to members of the patients care team.  Clayton Bibles, NP 06/09/2022  Pt aware and understands NP's role.

## 2022-06-09 NOTE — Assessment & Plan Note (Signed)
Asthmatic bronchitis with acute exacerbation, likely related to URI. I suspect there is a component of upper airway irritation contributing to her cough as well. We will treat her with prednisone taper and empiric azithromycin. Target cough and postnasal drainage. Continue ICS/LABA therapy. Asthma action plan in place.   Patient Instructions  Continue Albuterol inhaler 2 puffs or 3 mL neb every 6 hours as needed for shortness of breath or wheezing. Notify if symptoms persist despite rescue inhaler/neb use.  Continue advair 2 puffs Twice daily. Brush tongue and rinse mouth afterwards Continue nasocort nasal spray 2 sprays each nostril daily Continue singulair  (montelukast) 1 tab At bedtime  Restart Saline nasal irrigations 1-2 times a day before nasocort Continue alternating between zyrtec and claritin  Continue Famotidine 20 mg Twice daily; 30 min before breakfast in the AM and 30 min before bedtime   Chlorpheniramine 4 mg tab over the counter every 6 hours for cough/drainage (may cause drowsiness so ok to only use at bedtime if needed) Promethazine DM cough syrup 5 mL every 6 hours as needed for cough. May cause drowsiness. Do not drive after taking. Do not take delsym if you take this cough syrup as it contains the same medication Prednisone taper. 4 tabs for 2 days, then 3 tabs for 2 days, 2 tabs for 2 days, then 1 tab for 2 days, then stop. Take in AM with food Azithromycin (z pack) - take 2 tablets on day one then one tablet daily for four additional days. Take with food.    Avoid throat clearing. Work on cough suppression with the above recommended suppressants.  Use sugar free hard candies or non-menthol cough drops during this time to soothe your throat.  Warm tea with honey and lemon.    Follow up in 2 weeks with [name] or Alanson Aly. If symptoms do not improve or worsen, please contact office for sooner follow up or seek emergency care.

## 2022-06-24 ENCOUNTER — Ambulatory Visit: Payer: BC Managed Care – PPO | Admitting: Nurse Practitioner

## 2022-07-25 ENCOUNTER — Ambulatory Visit: Payer: BC Managed Care – PPO | Admitting: Nurse Practitioner

## 2022-07-25 DIAGNOSIS — G4733 Obstructive sleep apnea (adult) (pediatric): Secondary | ICD-10-CM | POA: Diagnosis not present

## 2022-07-25 DIAGNOSIS — R0683 Snoring: Secondary | ICD-10-CM

## 2022-07-28 DIAGNOSIS — G4733 Obstructive sleep apnea (adult) (pediatric): Secondary | ICD-10-CM | POA: Diagnosis not present

## 2022-08-04 ENCOUNTER — Ambulatory Visit: Payer: BC Managed Care – PPO | Admitting: Internal Medicine

## 2022-08-05 NOTE — Progress Notes (Signed)
HST showed mild OSA. Please reschedule her appt Monday with Beth to my schedule next week, if possible unless she needs to be seen sooner for acute reasons. Thanks!

## 2022-08-08 ENCOUNTER — Ambulatory Visit: Payer: BC Managed Care – PPO | Admitting: Primary Care

## 2022-08-08 NOTE — Progress Notes (Deleted)
$'@Patient'g$  ID: Kaitlyn Hays, female    DOB: 1980-12-17, 42 y.o.   MRN: [id number]  No chief complaint on file.   Referring provider: Lin Landsman, MD  HPI: 42 year old female, never smoked.  Past medical history significant for moderate persistent asthma.  Patient of [name], last seen by nurse practitioner on 06/09/2022 asthma exacerbation.   08/08/2022- Interim hx  Patient presents today to review sleep study results.  Patient had home sleep study 07/25/2022 that showed evidenced of mild OSA, AHI 7.1/hour with SpO2 low 65% (average 93%).    She was treated for acute asthma exacerbation back in December with Z-Pak, prednisone taper and promethazine-DM.  Maintained on  Advair and as needed albuterol. Continues Nasacort, Singulair, saline rinses, Zyrtec/Claritin.   Pulmonary function testing in 2022 showed moderate-severe restriction/ FEV1 41%.  No Known Allergies  Immunization History  Administered Date(s) Administered   PFIZER(Purple Top)SARS-COV-2 Vaccination 02/21/2020, 03/19/2020    Past Medical History:  Diagnosis Date   Anemia    Asthma    Bronchitis    Fibroids    Gonorrhea    Tibia fracture 08/07/2013    Tobacco History: Social History   Tobacco Use  Smoking Status Never  Smokeless Tobacco Never   Counseling given: Not Answered   Outpatient Medications Prior to Visit  Medication Sig Dispense Refill   albuterol (PROVENTIL) (2.5 MG/3ML) 0.083% nebulizer solution Take 3 mLs (2.5 mg total) by nebulization every 6 (six) hours as needed for wheezing or shortness of breath. 75 mL 3   albuterol (VENTOLIN HFA) 108 (90 Base) MCG/ACT inhaler Inhale 1-2 puffs into the lungs every 6 (six) hours as needed for wheezing or shortness of breath. 8 g 5   azithromycin (ZITHROMAX) 250 MG tablet Take 2 tablets on day one then 1 tablet daily for four additional days 6 tablet 0   cetirizine (ZYRTEC ALLERGY) 10 MG tablet Take 1 tablet (10 mg total) by mouth daily. 30 tablet  2   cyclobenzaprine (FLEXERIL) 10 MG tablet Take 10 mg by mouth 3 (three) times daily as needed for muscle spasms. As needed (Patient not taking: Reported on 04/28/2022)     famotidine (PEPCID) 20 MG tablet Take 1 tablet (20 mg total) by mouth 2 (two) times daily. 60 tablet 5   fluticasone-salmeterol (ADVAIR HFA) 230-21 MCG/ACT inhaler Inhale 2 puffs into the lungs 2 (two) times daily. 1 each 11   hydrochlorothiazide (HYDRODIURIL) 25 MG tablet Take 25 mg by mouth daily. (Patient not taking: Reported on 06/09/2022)     megestrol (MEGACE) 40 MG tablet Take 1 tablet (40 mg total) by mouth 2 (two) times daily. Can increase to two tablets twice a day in the event of heavy bleeding 60 tablet 5   montelukast (SINGULAIR) 10 MG tablet TAKE 1 TABLET(10 MG) BY MOUTH AT BEDTIME 30 tablet 11   predniSONE (DELTASONE) 10 MG tablet 4 tabs for 2 days, then 3 tabs for 2 days, 2 tabs for 2 days, then 1 tab for 2 days, then stop 20 tablet 0   promethazine-dextromethorphan (PROMETHAZINE-DM) 6.25-15 MG/5ML syrup Take 5 mLs by mouth 4 (four) times daily as needed for cough. 118 mL 0   Facility-Administered Medications Prior to Visit  Medication Dose Route Frequency Provider Last Rate Last Admin   ipratropium-albuterol (DUONEB) 0.5-2.5 (3) MG/3ML nebulizer solution 3 mL  3 mL Nebulization Daily PRN Cobb, Karie Schwalbe, NP   3 mL at 03/18/22 1343      Review of Systems  Review of  Systems   Physical Exam  There were no vitals taken for this visit. Physical Exam   Lab Results:  CBC    Component Value Date/Time   WBC 16.9 (H) 11/26/2020 1410   RBC 5.35 (H) 11/26/2020 1410   HGB 14.5 11/26/2020 1410   HGB 10.6 (L) 10/08/2019 0913   HCT 43.6 11/26/2020 1410   HCT 36.3 10/08/2019 0913   PLT 355.0 11/26/2020 1410   PLT 428 10/08/2019 0913   MCV 81.6 11/26/2020 1410   MCV 72 (L) 10/08/2019 0913   MCH 20.9 (L) 10/08/2019 0913   MCH 16.2 (L) 01/12/2015 1552   MCHC 33.3 11/26/2020 1410   RDW 14.7 11/26/2020  1410   RDW 19.0 (H) 08/19/2019 1455   LYMPHSABS 0.8 11/26/2020 1410   MONOABS 0.7 11/26/2020 1410   EOSABS 0.0 11/26/2020 1410   BASOSABS 0.0 11/26/2020 1410    BMET    Component Value Date/Time   NA 137 04/28/2022 0945   K 3.9 04/28/2022 0945   CL 107 04/28/2022 0945   CO2 26 04/28/2022 0945   GLUCOSE 88 04/28/2022 0945   BUN 14 04/28/2022 0945   CREATININE 0.91 04/28/2022 0945   CALCIUM 9.4 04/28/2022 0945   GFRNONAA >90 08/08/2013 0355   GFRAA >90 08/08/2013 0355    BNP No results found for: "BNP"  ProBNP No results found for: "PROBNP"  Imaging: No results found.   Assessment & Plan:   No problem-specific Assessment & Plan notes found for this encounter.     Martyn Ehrich, NP 08/08/2022

## 2022-08-09 ENCOUNTER — Other Ambulatory Visit: Payer: Self-pay | Admitting: Obstetrics and Gynecology

## 2022-08-09 DIAGNOSIS — N938 Other specified abnormal uterine and vaginal bleeding: Secondary | ICD-10-CM

## 2022-08-22 ENCOUNTER — Ambulatory Visit: Payer: BC Managed Care – PPO | Admitting: Primary Care

## 2022-08-24 ENCOUNTER — Ambulatory Visit (INDEPENDENT_AMBULATORY_CARE_PROVIDER_SITE_OTHER): Payer: BC Managed Care – PPO | Admitting: Primary Care

## 2022-08-24 ENCOUNTER — Encounter: Payer: Self-pay | Admitting: Primary Care

## 2022-08-24 VITALS — BP 122/82 | HR 104 | Temp 98.0°F | Ht 71.0 in | Wt 263.2 lb

## 2022-08-24 DIAGNOSIS — G4733 Obstructive sleep apnea (adult) (pediatric): Secondary | ICD-10-CM | POA: Diagnosis not present

## 2022-08-24 DIAGNOSIS — J4541 Moderate persistent asthma with (acute) exacerbation: Secondary | ICD-10-CM | POA: Diagnosis not present

## 2022-08-24 DIAGNOSIS — G4730 Sleep apnea, unspecified: Secondary | ICD-10-CM | POA: Insufficient documentation

## 2022-08-24 MED ORDER — PROMETHAZINE-DM 6.25-15 MG/5ML PO SYRP: 5.0000 mL | ORAL_SOLUTION | Freq: Four times a day (QID) | ORAL | 0 refills | Status: DC | PRN

## 2022-08-24 MED ORDER — FLUTICASONE-SALMETEROL 230-21 MCG/ACT IN AERO: 2.0000 | INHALATION_SPRAY | Freq: Two times a day (BID) | RESPIRATORY_TRACT | 11 refills | Status: DC

## 2022-08-24 MED ORDER — DOXYCYCLINE HYCLATE 100 MG PO TABS: 100.0000 mg | ORAL_TABLET | Freq: Two times a day (BID) | ORAL | 0 refills | Status: DC

## 2022-08-24 NOTE — Progress Notes (Signed)
$'@Patient'M$  ID: Kaitlyn Hays, female    DOB: 1980/08/12, 42 y.o.   MRN: [id number]  Chief Complaint  Patient presents with   Follow-up    HST:07/25/2022 Breathing has been off  Cough  ACT 11    Referring provider: Lin Landsman, MD  HPI:  42 year old female, never smoked.  Past medical history significant for moderate persistent asthma.  Patient of [name], last seen by nurse practitioner on 06/09/2022 asthma exacerbation.  08/24/2022 - Interim hx  Patient presents today to review sleep study results.  Patient had home sleep study 07/25/2022 that showed evidenced of mild OSA, AHI 7.1/hour with SpO2 low 65% (average 93%).  Patient spent 42 minutes with oxygen level less than 85%.  Discussed sleep study results today and treatment options.  Due to nocturnal hypoxia and clinical symptoms of snoring and daytime sleepiness recommend patient be started on CPAP.  She is in agreement with plan.  We will start patient on auto CPAP and check [place].  She is motivated to lose weight.  She was treated for acute asthma exacerbation back in December with Z-Pak, prednisone taper and promethazine-DM. Pulmonary function testing in 2022 showed moderate-severe restriction/ FEV1 41%. She was sick last week. She has a lot of nasal congestion and coughing symptoms. She works around a lot of dust. She is taking montelukast and Claritin. She ran out of Advair last week.     No Known Allergies  Immunization History  Administered Date(s) Administered   PFIZER(Purple Top)SARS-COV-2 Vaccination 02/21/2020, 03/19/2020    Past Medical History:  Diagnosis Date   Anemia    Asthma    Bronchitis    Fibroids    Gonorrhea    Tibia fracture 08/07/2013    Tobacco History: Social History   Tobacco Use  Smoking Status Never  Smokeless Tobacco Never   Counseling given: Not Answered   Outpatient Medications Prior to Visit  Medication Sig Dispense Refill   albuterol (PROVENTIL) (2.5 MG/3ML) 0.083%  nebulizer solution Take 3 mLs (2.5 mg total) by nebulization every 6 (six) hours as needed for wheezing or shortness of breath. 75 mL 3   albuterol (VENTOLIN HFA) 108 (90 Base) MCG/ACT inhaler Inhale 1-2 puffs into the lungs every 6 (six) hours as needed for wheezing or shortness of breath. 8 g 5   cyclobenzaprine (FLEXERIL) 10 MG tablet Take 10 mg by mouth 3 (three) times daily as needed for muscle spasms. As needed     famotidine (PEPCID) 20 MG tablet Take 1 tablet (20 mg total) by mouth 2 (two) times daily. 60 tablet 5   hydrochlorothiazide (HYDRODIURIL) 25 MG tablet Take 25 mg by mouth daily.     megestrol (MEGACE) 40 MG tablet Take 1 tablet (40 mg total) by mouth 2 (two) times daily. Can increase to two tablets twice a day in the event of heavy bleeding 60 tablet 5   montelukast (SINGULAIR) 10 MG tablet TAKE 1 TABLET(10 MG) BY MOUTH AT BEDTIME 30 tablet 11   cetirizine (ZYRTEC ALLERGY) 10 MG tablet Take 1 tablet (10 mg total) by mouth daily. 30 tablet 2   fluticasone-salmeterol (ADVAIR HFA) 230-21 MCG/ACT inhaler Inhale 2 puffs into the lungs 2 (two) times daily. 1 each 11   azithromycin (ZITHROMAX) 250 MG tablet Take 2 tablets on day one then 1 tablet daily for four additional days (Patient not taking: Reported on 08/24/2022) 6 tablet 0   predniSONE (DELTASONE) 10 MG tablet 4 tabs for 2 days, then 3 tabs for 2  days, 2 tabs for 2 days, then 1 tab for 2 days, then stop (Patient not taking: Reported on 08/24/2022) 20 tablet 0   promethazine-dextromethorphan (PROMETHAZINE-DM) 6.25-15 MG/5ML syrup Take 5 mLs by mouth 4 (four) times daily as needed for cough. (Patient not taking: Reported on 08/24/2022) 118 mL 0   Facility-Administered Medications Prior to Visit  Medication Dose Route Frequency Provider Last Rate Last Admin   ipratropium-albuterol (DUONEB) 0.5-2.5 (3) MG/3ML nebulizer solution 3 mL  3 mL Nebulization Daily PRN Cobb, Karie Schwalbe, NP   3 mL at 03/18/22 1343    Review of  Systems  Review of Systems  Constitutional:  Positive for fatigue.  HENT:  Positive for congestion and postnasal drip.   Respiratory:  Positive for cough and wheezing.   Psychiatric/Behavioral:  Positive for sleep disturbance.    Physical Exam  BP 122/82 (BP Location: Left Arm, Patient Position: Sitting, Cuff Size: Normal)   Pulse (!) 104   Temp 98 F (36.7 C) (Oral)   Ht '5\' 11"'$  (1.803 m)   Wt 263 lb 3.2 oz (119.4 kg)   SpO2 99%   BMI 36.71 kg/m  Physical Exam Constitutional:      Appearance: Normal appearance.  HENT:     Head: Normocephalic and atraumatic.     Mouth/Throat:     Mouth: Mucous membranes are moist.     Pharynx: Oropharynx is clear.  Cardiovascular:     Rate and Rhythm: Normal rate and regular rhythm.  Pulmonary:     Effort: Pulmonary effort is normal.     Breath sounds: Normal breath sounds.     Comments: Patients lungs are clear on exam Musculoskeletal:        General: Normal range of motion.  Skin:    General: Skin is warm and dry.  Neurological:     General: No focal deficit present.     Mental Status: She is alert and oriented to person, place, and time. Mental status is at baseline.  Psychiatric:        Mood and Affect: Mood normal.        Behavior: Behavior normal.        Thought Content: Thought content normal.        Judgment: Judgment normal.      Lab Results:  CBC    Component Value Date/Time   WBC 16.9 (H) 11/26/2020 1410   RBC 5.35 (H) 11/26/2020 1410   HGB 14.5 11/26/2020 1410   HGB 10.6 (L) 10/08/2019 0913   HCT 43.6 11/26/2020 1410   HCT 36.3 10/08/2019 0913   PLT 355.0 11/26/2020 1410   PLT 428 10/08/2019 0913   MCV 81.6 11/26/2020 1410   MCV 72 (L) 10/08/2019 0913   MCH 20.9 (L) 10/08/2019 0913   MCH 16.2 (L) 01/12/2015 1552   MCHC 33.3 11/26/2020 1410   RDW 14.7 11/26/2020 1410   RDW 19.0 (H) 08/19/2019 1455   LYMPHSABS 0.8 11/26/2020 1410   MONOABS 0.7 11/26/2020 1410   EOSABS 0.0 11/26/2020 1410   BASOSABS 0.0  11/26/2020 1410    BMET    Component Value Date/Time   NA 137 04/28/2022 0945   K 3.9 04/28/2022 0945   CL 107 04/28/2022 0945   CO2 26 04/28/2022 0945   GLUCOSE 88 04/28/2022 0945   BUN 14 04/28/2022 0945   CREATININE 0.91 04/28/2022 0945   CALCIUM 9.4 04/28/2022 0945   GFRNONAA >90 08/08/2013 0355   GFRAA >90 08/08/2013 0355    BNP No results  found for: "BNP"  ProBNP No results found for: "PROBNP"  Imaging: No results found.   Assessment & Plan:   Mild sleep apnea - Patient has clinical symptoms of snoring, restless sleep and daytime sleepiness.  She had home sleep study on 07/25/2022 that showed evidence of mild OSA, AHI 7.1 an hour with SpO2 low 65%.  Due to moderate to severe hypoxia recommending patient be started on CPAP.  Patient is in agreement with plan.  Will start patient on auto CPAP 5 to 20 cm H2O with mask of choice.  Recommend checking overnight oximetry test once on CPAP.  Nocturnal hypoxia could also be related to restrictive lung disease due to obesity.  Patient is motivated to lose weight however if she still struggling at follow-up would recommend referring to healthy weight and wellness.  Follow-up in 6 to 8 weeks for CPAP compliance check.  Moderate persistent asthmatic bronchitis with exacerbation - Patient developed URI symptoms last week. She continues to have a lot of nasal congestion and coughing symptoms. Last exacerbation was in December requiring zpack and prednisone. Patient ran out of Advair inhaler last week. Lungs were clear on exam. Sending in Rx for Doxycycline '100mg'$  BID x 7 days for bronchitis. She would like to hold off on taking additional prednisone. Refilling Advair 230-85mg two puffs twice daily and promethazine-dm [id number] for cough. Checking CBC with diff and IgE, began discussion about biologics for add on treatment for asthma since she has been having recurrent exacerbations.      Kaitlyn EhrichNP 08/24/2022

## 2022-08-24 NOTE — Patient Instructions (Addendum)
Recommendations: - Resume Advair 230-52mg - take two puffs morning and evening  - Use Albuterol inhaler 2 puffs every 4-6 hours as needed for breakthrough sob/chest tightness - Continue Claritin '10mg'$   - Continue Montelukast '10mg'$   - Take abx as direct, call if wheezing isnt better with above  - Once you receive CPAP, aim to wear nightly 4-6 hours   Order: - Auto CPAP 5-20cm h20 - Labs today   Follow-up:  - 8 weeks with Beth NP for CPAP compliance/asthma follow-up

## 2022-08-24 NOTE — Assessment & Plan Note (Addendum)
-   Patient has clinical symptoms of snoring, restless sleep and daytime sleepiness.  She had home sleep study on 07/25/2022 that showed evidence of mild OSA, AHI 7.1 an hour with SpO2 low 65%.  Due to moderate to severe hypoxia recommending patient be started on CPAP.  Patient is in agreement with plan.  Will start patient on auto CPAP 5 to 20 cm H2O with mask of choice.  Recommend checking overnight oximetry test once on CPAP.  Nocturnal hypoxia could also be related to restrictive lung disease due to obesity.  Patient is motivated to lose weight however if she still struggling at follow-up would recommend referring to healthy weight and wellness.  Follow-up in 6 to 8 weeks for CPAP compliance check.

## 2022-08-24 NOTE — Assessment & Plan Note (Addendum)
-   Patient developed URI symptoms last week. She continues to have a lot of nasal congestion and coughing symptoms. Last exacerbation was in December requiring zpack and prednisone. Patient ran out of Advair inhaler last week. Lungs were clear on exam. Sending in Rx for Doxycycline 100mg  BID x 7 days for bronchitis. She would like to hold off on taking additional prednisone. Refilling Advair 230-60mcg two puffs twice daily and promethazine-dm 60ml QID for cough. Checking CBC with diff and IgE, began discussion about biologics for add on treatment for asthma since she has been having recurrent exacerbations.

## 2022-08-25 LAB — CBC WITH DIFFERENTIAL/PLATELET
Basophils Absolute: 0.1 10*3/uL (ref 0.0–0.1)
Basophils Relative: 1 % (ref 0.0–3.0)
Eosinophils Absolute: 0.4 10*3/uL (ref 0.0–0.7)
Eosinophils Relative: 4.2 % (ref 0.0–5.0)
HCT: 44.3 % (ref 36.0–46.0)
Hemoglobin: 14.5 g/dL (ref 12.0–15.0)
Lymphocytes Relative: 25.2 % (ref 12.0–46.0)
Lymphs Abs: 2.4 10*3/uL (ref 0.7–4.0)
MCHC: 32.7 g/dL (ref 30.0–36.0)
MCV: 82.5 fl (ref 78.0–100.0)
Monocytes Absolute: 0.8 10*3/uL (ref 0.1–1.0)
Monocytes Relative: 8.3 % (ref 3.0–12.0)
Neutro Abs: 5.8 10*3/uL (ref 1.4–7.7)
Neutrophils Relative %: 61.3 % (ref 43.0–77.0)
Platelets: 350 10*3/uL (ref 150.0–400.0)
RBC: 5.37 Mil/uL — ABNORMAL HIGH (ref 3.87–5.11)
RDW: 13.7 % (ref 11.5–15.5)
WBC: 9.5 10*3/uL (ref 4.0–10.5)

## 2022-08-25 LAB — IGE: IgE (Immunoglobulin E), Serum: 31 kU/L (ref <=114)

## 2022-08-30 ENCOUNTER — Other Ambulatory Visit: Payer: Self-pay | Admitting: Family Medicine

## 2022-08-30 DIAGNOSIS — Z1231 Encounter for screening mammogram for malignant neoplasm of breast: Secondary | ICD-10-CM

## 2022-09-07 ENCOUNTER — Telehealth: Payer: Self-pay | Admitting: Primary Care

## 2022-09-07 NOTE — Telephone Encounter (Signed)
Can we run a ticket on patients insurance to see what's covered?  Please route message back to triage

## 2022-09-07 NOTE — Telephone Encounter (Signed)
Pt. Calling about fluticasone-salmeterol (ADVAIR HFA)  her insurance wont pay for it anymore and wants to know if she can be prescribed something else to her pharmacy

## 2022-09-12 ENCOUNTER — Telehealth: Payer: Self-pay | Admitting: Primary Care

## 2022-09-12 MED ORDER — BUDESONIDE-FORMOTEROL FUMARATE 160-4.5 MCG/ACT IN AERO: 2.0000 | INHALATION_SPRAY | Freq: Two times a day (BID) | RESPIRATORY_TRACT | 6 refills | Status: DC

## 2022-09-12 MED ORDER — PREDNISONE 10 MG PO TABS: ORAL_TABLET | ORAL | 0 refills | Status: AC

## 2022-09-12 NOTE — Telephone Encounter (Signed)
Spoke with pt and notified her of change in inhaler and offered her prednisone taper. Pt states she would like prednisone taper called in. Order was placed. Pt states she has not heard anything from [place] regarding [place]. Reached out to [place] who stated they were currently working on the order and would expedite. Nothing further needed at this time.

## 2022-09-12 NOTE — Telephone Encounter (Signed)
See separate encounter about info from 4/1.

## 2022-09-12 NOTE — Telephone Encounter (Signed)
Pt came in to the office stating that she was not feeling well. When speaking with pt, she said that she still has not been able to get a maintenance inhaler as the Advair HFA that was prescribed was going to cost her over $300 due to it not being covered by insurance.  Pt finished the doxycycline that was prescribed at last visit. Stated that she was not prescribed prednisone due to some side effects she has had in the past when taking it but states that she is okay to try it now as she is still wheezing.  Pt is still coughing getting up phlegm that was green 2 days ago but states today it was a cloudy white color. Pt is coughing all throughout the night even with taking the promethazine DM cough syrup which is not helping her at all.  Pt has used her rescue inhaler 1-2 times daily and has used her nebulizer at least once daily recently.  Pt has been waking up with migraines, still has not received cpap yet from DME.   With all that has been happening recently, pt wants to know what could possibly be recommended. Beth, please advise on all this for pt.   **Please route to triage pool and not directly back to me as I'm up front on phones**

## 2022-09-12 NOTE — Telephone Encounter (Signed)
Pt in lobby says she is not feeling well , having breathing issues, wanting to speak w/nurse.Hillery Hunter

## 2022-09-12 NOTE — Telephone Encounter (Signed)
Can we follow-up on CPAP order- auto 5-15cm h20 with mask of choice. She had home sleep study on 07/25/2022 that showed evidence of mild OSA, AHI 7.1 an hour with SpO2 low 65%. She will need ONO on CPAP as well.   Looks like Symbicort is covered, I will change her to this from Advair  If she is willing to take medication we can send in taper 40mg  x 3 days, 30mg  x 3 days, 20mg  x 3 days, 10mg  x 3 days.

## 2022-10-10 ENCOUNTER — Other Ambulatory Visit: Payer: Self-pay | Admitting: Primary Care

## 2022-10-13 ENCOUNTER — Ambulatory Visit: Payer: BC Managed Care – PPO

## 2022-10-19 ENCOUNTER — Encounter: Payer: Self-pay | Admitting: Obstetrics and Gynecology

## 2022-10-19 ENCOUNTER — Other Ambulatory Visit (HOSPITAL_COMMUNITY)
Admission: RE | Admit: 2022-10-19 | Discharge: 2022-10-19 | Disposition: A | Discharge status: Home / Self Care | Payer: BC Managed Care – PPO | Source: Ambulatory Visit | Attending: Obstetrics and Gynecology | Admitting: Obstetrics and Gynecology

## 2022-10-19 ENCOUNTER — Other Ambulatory Visit: Payer: Self-pay

## 2022-10-19 ENCOUNTER — Ambulatory Visit (INDEPENDENT_AMBULATORY_CARE_PROVIDER_SITE_OTHER): Payer: BC Managed Care – PPO | Admitting: Obstetrics and Gynecology

## 2022-10-19 VITALS — BP 118/70 | HR 110 | Ht 71.0 in | Wt 260.0 lb

## 2022-10-19 DIAGNOSIS — Z113 Encounter for screening for infections with a predominantly sexual mode of transmission: Secondary | ICD-10-CM | POA: Insufficient documentation

## 2022-10-19 DIAGNOSIS — N938 Other specified abnormal uterine and vaginal bleeding: Secondary | ICD-10-CM | POA: Diagnosis not present

## 2022-10-19 DIAGNOSIS — Z01419 Encounter for gynecological examination (general) (routine) without abnormal findings: Secondary | ICD-10-CM | POA: Diagnosis not present

## 2022-10-19 DIAGNOSIS — Z124 Encounter for screening for malignant neoplasm of cervix: Secondary | ICD-10-CM | POA: Insufficient documentation

## 2022-10-19 LAB — POCT PREGNANCY, URINE: Preg Test, Ur: NEGATIVE

## 2022-10-19 MED ORDER — MEGESTROL ACETATE 40 MG PO TABS: 40.0000 mg | ORAL_TABLET | Freq: Two times a day (BID) | ORAL | 5 refills | Status: DC

## 2022-10-19 NOTE — Progress Notes (Signed)
ANNUAL EXAM Patient name: Kaitlyn Hays MRN [id number]  Date of birth: 08-18-1980 Chief Complaint:   Gynecologic Exam  History of Present Illness:   Kaitlyn Hays is a 42 y.o. G0P0000 female being seen today for a routine annual exam.  Current complaints: desires pregnancy, unsure if she wants to or even can get pregnant. Liked being on the megace and reports improvement with bleeding.     [facility] - 10/19/22 1107       Pregnancy Intention Screening   Does the patient want to become pregnant in the next year? No    Does the patient's partner want to become pregnant in the next year? Unsure    Would the patient like to discuss contraceptive options today? No      Contraception Wrap Up   Current Method No Method - Other Reason    End Method No Contraception Precautions    Contraception Counseling Provided No            The pregnancy intention screening data noted above was reviewed. Potential methods of contraception were discussed.   Last pap 07/22/21. Results were: ASCUS w/ HRHPV negative. H/O abnormal pap: yes Last mammogram: 09/14/21, next scheduled 5/15. Results were: normal.  Last colonoscopy: n/a.      09/14/2021    2:00 PM 07/22/2021    3:51 PM 11/03/2020    4:46 PM 10/08/2019    8:26 AM 09/16/2019    4:49 PM  Depression screen PHQ 2/9  Decreased Interest 0 0 0 0 2  Down, Depressed, Hopeless 0 0 0 0 0  PHQ - 2 Score 0 0 0 0 2  Altered sleeping 0 1 2 1 1   Tired, decreased energy 0 1 2 2 1   Change in appetite 0 1 2 0 1  Feeling bad or failure about yourself  0 0 0 0 0  Trouble concentrating 0 0 0 0 0  Moving slowly or fidgety/restless 0 0 0 0 0  Suicidal thoughts 0 0 0 0 0  PHQ-9 Score 0 3 6 3 5         09/14/2021    2:00 PM 07/22/2021    3:51 PM 11/03/2020    4:46 PM 10/08/2019    8:27 AM  GAD 7 : Generalized Anxiety Score  Nervous, Anxious, on Edge 0 1 1 0  Control/stop worrying 0 1 1 1   Worry too much - different things 1 1 0 1  Trouble relaxing 0 0 0 0   Restless 0 0 0 0  Easily annoyed or irritable 0 1 0 0  Afraid - awful might happen 1 0 0 0  Total GAD 7 Score 2 4 2 2      Review of Systems:   Pertinent items are noted in HPI Denies any headaches, blurred vision, fatigue, shortness of breath, chest pain, abdominal pain, abnormal vaginal discharge/itching/odor/irritation, problems with periods, bowel movements, urination, or intercourse unless otherwise stated above. Pertinent History Reviewed:  Reviewed past medical,surgical, social and family history.  Reviewed problem list, medications and allergies. Physical Assessment:   Vitals:   10/19/22 1055  BP: 118/70  Pulse: (!) 110  Weight: 260 lb (117.9 kg)  Height: 5\' 11"  (1.803 m)  Body mass index is 36.26 kg/m.        Physical Examination:   General appearance - well appearing, and in no distress  Mental status - alert, oriented to person, place, and time  Psych:  She has a normal mood and affect  Skin - warm and dry, normal color, no suspicious lesions noted  Chest - effort normal, bilateral wheezes  Heart - normal rate and regular rhythm  Neck:  midline trachea, no thyromegaly or nodules  Breasts - breasts appear normal, no suspicious masses, no skin or nipple changes or      axillary nodes  Abdomen - soft, nontender, nondistended, no masses or organomegaly  Pelvic - VULVA: normal appearing vulva with no masses, tenderness or lesions  VAGINA: normal appearing vagina with normal color and discharge, no lesions  CERVIX: normal appearing cervix without discharge or lesions, no CMT  Thin prep pap is done   UTERUS: uterus is felt to be normal size, shape, consistency and nontender   ADNEXA: No adnexal masses or tenderness noted.   Chaperone present for exam  Results for orders placed or performed in visit on 10/19/22 (from the past 24 hour(s))  Pregnancy, urine POC   Collection Time: 10/19/22 11:53 AM  Result Value Ref Range   Preg Test, Ur NEGATIVE NEGATIVE    Assessment  & Plan:  1. Well woman exam Mammogram scheduled 5/15 Followed by pulmonology for asthma  2. DUB (dysfunctional uterine bleeding) Discussed desire for pregnancy. Discussed in length, going off megace, fibroids, periods, ovulation predictor and potential need for fertility clinic after TTC. Patient is unsure and would like to continue megace for now as she was doing well. UPT negative, sent in rx.   - megestrol (MEGACE) 40 MG tablet; Take 1 tablet (40 mg total) by mouth 2 (two) times daily. Can increase to two tablets twice a day in the event of heavy bleeding  Dispense: 60 tablet; Refill: 5  3. Cervical cancer screening Hx abnormal pap (ASCUS 07/22/21) - Cytology - PAP( [place])  4. Screen for STD (sexually transmitted disease) - Cervicovaginal ancillary only    Follow-up: Follow up PRN with myself or Dr. Chrystie Nose, FNP

## 2022-10-20 LAB — CERVICOVAGINAL ANCILLARY ONLY
Bacterial Vaginitis (gardnerella): NEGATIVE
Candida Glabrata: NEGATIVE
Candida Vaginitis: NEGATIVE
Chlamydia: NEGATIVE
Comment: NEGATIVE
Comment: NEGATIVE
Comment: NEGATIVE
Comment: NEGATIVE
Comment: NEGATIVE
Comment: NORMAL
Neisseria Gonorrhea: NEGATIVE
Trichomonas: NEGATIVE

## 2022-10-24 LAB — CYTOLOGY - PAP
Adequacy: ABSENT
Comment: NEGATIVE
Diagnosis: NEGATIVE
High risk HPV: NEGATIVE

## 2022-10-26 ENCOUNTER — Ambulatory Visit: Payer: BC Managed Care – PPO

## 2022-10-28 ENCOUNTER — Ambulatory Visit: Payer: BC Managed Care – PPO | Admitting: Primary Care

## 2022-11-03 ENCOUNTER — Ambulatory Visit
Admission: RE | Admit: 2022-11-03 | Discharge: 2022-11-03 | Disposition: A | Discharge status: Home / Self Care | Payer: BC Managed Care – PPO | Source: Ambulatory Visit | Attending: Family Medicine | Admitting: Family Medicine

## 2022-11-03 DIAGNOSIS — Z1231 Encounter for screening mammogram for malignant neoplasm of breast: Secondary | ICD-10-CM

## 2022-11-17 ENCOUNTER — Ambulatory Visit: Payer: BC Managed Care – PPO | Admitting: Primary Care

## 2023-01-05 ENCOUNTER — Ambulatory Visit (INDEPENDENT_AMBULATORY_CARE_PROVIDER_SITE_OTHER): Payer: BC Managed Care – PPO | Admitting: Pulmonary Disease

## 2023-01-05 ENCOUNTER — Encounter: Payer: Self-pay | Admitting: Pulmonary Disease

## 2023-01-05 VITALS — BP 130/90 | HR 103 | Temp 98.1°F | Ht 71.0 in | Wt 262.0 lb

## 2023-01-05 DIAGNOSIS — J4541 Moderate persistent asthma with (acute) exacerbation: Secondary | ICD-10-CM

## 2023-01-05 LAB — CBC WITH DIFFERENTIAL/PLATELET
Basophils Absolute: 0.1 10*3/uL (ref 0.0–0.1)
Basophils Relative: 0.7 % (ref 0.0–3.0)
Eosinophils Absolute: 0.3 10*3/uL (ref 0.0–0.7)
Eosinophils Relative: 2.1 % (ref 0.0–5.0)
HCT: 43.6 % (ref 36.0–46.0)
Hemoglobin: 13.7 g/dL (ref 12.0–15.0)
Lymphocytes Relative: 20.1 % (ref 12.0–46.0)
Lymphs Abs: 2.5 10*3/uL (ref 0.7–4.0)
MCHC: 31.5 g/dL (ref 30.0–36.0)
MCV: 82.3 fl (ref 78.0–100.0)
Monocytes Absolute: 1 10*3/uL (ref 0.1–1.0)
Monocytes Relative: 8.4 % (ref 3.0–12.0)
Neutro Abs: 8.5 10*3/uL — ABNORMAL HIGH (ref 1.4–7.7)
Neutrophils Relative %: 68.7 % (ref 43.0–77.0)
Platelets: 310 10*3/uL (ref 150.0–400.0)
RBC: 5.3 Mil/uL — ABNORMAL HIGH (ref 3.87–5.11)
RDW: 14.8 % (ref 11.5–15.5)
WBC: 12.3 10*3/uL — ABNORMAL HIGH (ref 4.0–10.5)

## 2023-01-05 MED ORDER — PREDNISONE 10 MG PO TABS
ORAL_TABLET | ORAL | 0 refills | Status: AC
Start: 2023-01-05 — End: 2023-01-16

## 2023-01-05 MED ORDER — BREZTRI AEROSPHERE 160-9-4.8 MCG/ACT IN AERO: 2.0000 | INHALATION_SPRAY | Freq: Two times a day (BID) | RESPIRATORY_TRACT | Status: DC

## 2023-01-05 NOTE — Patient Instructions (Addendum)
Start Breztri inhaler 2 puffs twice daily with spacer - rinse mouth out after each use  Let us know if you would like to continue on breztri inhaler  Don't use symbicort while using breztri inhaler  Use albuterol inhaler 1-2 puffs every 4-6 hours  Start prednisone taper 40mg  x 3 days 30mg  x 3 days 20mg  x 3 days 10mg  x 3 days  Recommend starting CPAP therapy for your sleep apnea, please let us know in the future if you would like to start this treatment  We will check lab work today  Follow up in 3 months

## 2023-01-05 NOTE — Progress Notes (Signed)
Synopsis: Referred in July 2024 for acute visit, Patient of Dr. Marchelle Gearing  Subjective:   [name] GENDER: female DOB: 07-Oct-1980, MRN: [id number]  HPI  Chief Complaint  Patient presents with   Acute Visit    Increased cough- occ with with clear to yellow sputum, nasal congestion, SOB at night and chest tightness over the past wk.    42 year old female, never smoker followed for allergic asthma and chronic cough. She is a patient of Dr. Jane Canary and last seen in office 08/24/22 by Buelah Manis, NP. Past medical history significant for allergic rhinitis, GERD, IDA, fibroids.   Peripheral eosinophil level 400-600 between 2021 - 2024. IgE level 31-124.   She is on symbicort 160-4.58mcg 2 puffs twice daily and montelukast 10mg  daily. She is using albuterol as needed.  She reports dyspnea walking from her car to her work place over the past year.   She has not wanted to start CPAP therapy for her OSA (AHI 7.1/hr, SpO2 nadir 65%). She complains of feeling tired and fatigued still.   She last took prednisone over a month ago.   She is a never smoker. Significant second hand smoke exposure in childhood. Her husband smokes, but does not smoke in the home or cars. She works in Stage manager at [employer]. Denies issues with exposure to cleaning agents. She has increased breathing symptoms with fall and spring seasons.    Past Medical History:  Diagnosis Date   Anemia    Asthma    Bronchitis    Fibroids    Gonorrhea    Tibia fracture 08/07/2013     Family History  Problem Relation Age of Onset   Hypertension Other      Social History   Socioeconomic History   Marital status: Significant Other    Spouse name: Not on file   Number of children: Not on file   Years of education: Not on file   Highest education level: Not on file  Occupational History   Not on file  Tobacco Use   Smoking status: Never   Smokeless tobacco: Never  Vaping Use   Vaping status: Never  Used  Substance and Sexual Activity   Alcohol use: No   Drug use: No   Sexual activity: Yes    Partners: Female    Birth control/protection: Inserts  Other Topics Concern   Not on file  Social History Narrative   Not on file   Social Determinants of Health   Financial Resource Strain: Not on file  Food Insecurity: No Food Insecurity (09/14/2021)   Hunger Vital Sign    Worried About Running Out of Food in the Last Year: Never true    Ran Out of Food in the Last Year: Never true  Transportation Needs: No Transportation Needs (09/14/2021)   PRAPARE - Administrator, Civil Service (Medical): No    Lack of Transportation (Non-Medical): No  Physical Activity: Not on file  Stress: Not on file  Social Connections: Not on file  Intimate Partner Violence: Not on file     No Known Allergies   Outpatient Medications Prior to Visit  Medication Sig Dispense Refill   albuterol (PROVENTIL) (2.5 MG/3ML) 0.083% nebulizer solution INHALE 1 VIAL PER NEBULIZER EVERY 6 HOURS AS NEEDED FOR WHEEZING OR SHORTNESS OF BREATH 75 mL 3   albuterol (VENTOLIN HFA) 108 (90 Base) MCG/ACT inhaler Inhale 1-2 puffs into the lungs every 6 (six) hours as needed for wheezing or  shortness of breath. 8 g 5   budesonide-formoterol (SYMBICORT) 160-4.5 MCG/ACT inhaler Inhale 2 puffs into the lungs 2 (two) times daily. 1 each 6   cetirizine (ZYRTEC) 10 MG tablet Take 10 mg by mouth daily.     megestrol (MEGACE) 40 MG tablet Take 1 tablet (40 mg total) by mouth 2 (two) times daily. Can increase to two tablets twice a day in the event of heavy bleeding 60 tablet 5   montelukast (SINGULAIR) 10 MG tablet TAKE 1 TABLET(10 MG) BY MOUTH AT BEDTIME 30 tablet 11   cyclobenzaprine (FLEXERIL) 10 MG tablet Take 10 mg by mouth 3 (three) times daily as needed for muscle spasms. As needed     doxycycline (VIBRA-TABS) 100 MG tablet Take 1 tablet (100 mg total) by mouth 2 (two) times daily. 14 tablet 0   famotidine (PEPCID) 20 MG  tablet Take 1 tablet (20 mg total) by mouth 2 (two) times daily. (Patient not taking: Reported on 10/19/2022) 60 tablet 5   hydrochlorothiazide (HYDRODIURIL) 25 MG tablet Take 25 mg by mouth daily. (Patient not taking: Reported on 10/19/2022)     promethazine-dextromethorphan (PROMETHAZINE-DM) 6.25-15 MG/5ML syrup Take 5 mLs by mouth 4 (four) times daily as needed for cough. (Patient not taking: Reported on 10/19/2022) 240 mL 0   Facility-Administered Medications Prior to Visit  Medication Dose Route Frequency Provider Last Rate Last Admin   ipratropium-albuterol (DUONEB) 0.5-2.5 (3) MG/3ML nebulizer solution 3 mL  3 mL Nebulization Daily PRN Cobb, Ruby Cola, NP   3 mL at 03/18/22 1343    Review of Systems  Constitutional:  Negative for chills, fever, malaise/fatigue and weight loss.  HENT:  Negative for congestion, sinus pain and sore throat.   Eyes: Negative.   Respiratory:  Positive for cough, sputum production, shortness of breath and wheezing. Negative for hemoptysis.   Cardiovascular:  Negative for chest pain, palpitations, orthopnea, claudication and leg swelling.  Gastrointestinal:  Negative for abdominal pain, heartburn, nausea and vomiting.  Genitourinary: Negative.   Musculoskeletal:  Negative for joint pain and myalgias.  Skin:  Negative for rash.  Neurological:  Negative for weakness.  Endo/Heme/Allergies: Negative.   Psychiatric/Behavioral: Negative.        Objective:   Vitals:   01/05/23 0948  BP: (!) 130/90  Pulse: (!) 103  Temp: 98.1 F (36.7 C)  TempSrc: Oral  SpO2: 100%  Weight: 262 lb (118.8 kg)  Height: 5\' 11"  (1.803 m)     Physical Exam Constitutional:      General: She is not in acute distress.    Appearance: She is not ill-appearing.  HENT:     Head: Normocephalic and atraumatic.  Eyes:     General: No scleral icterus. Cardiovascular:     Rate and Rhythm: Normal rate and regular rhythm.     Pulses: Normal pulses.     Heart sounds: Normal heart  sounds. No murmur heard. Pulmonary:     Effort: Pulmonary effort is normal.     Breath sounds: Wheezing present. No rhonchi or rales.  Musculoskeletal:     Right lower leg: No edema.     Left lower leg: No edema.  Neurological:     General: No focal deficit present.     Mental Status: She is alert.     CBC    Component Value Date/Time   WBC 9.5 08/24/2022 1515   RBC 5.37 (H) 08/24/2022 1515   HGB 14.5 08/24/2022 1515   HGB 10.6 (L) 10/08/2019 0913  HCT 44.3 08/24/2022 1515   HCT 36.3 10/08/2019 0913   PLT 350.0 08/24/2022 1515   PLT 428 10/08/2019 0913   MCV 82.5 08/24/2022 1515   MCV 72 (L) 10/08/2019 0913   MCH 20.9 (L) 10/08/2019 0913   MCH 16.2 (L) 01/12/2015 1552   MCHC 32.7 08/24/2022 1515   RDW 13.7 08/24/2022 1515   RDW 19.0 (H) 08/19/2019 1455   LYMPHSABS 2.4 08/24/2022 1515   MONOABS 0.8 08/24/2022 1515   EOSABS 0.4 08/24/2022 1515   BASOSABS 0.1 08/24/2022 1515      Latest Ref Rng & Units 04/28/2022    9:45 AM 03/18/2022    1:59 PM 11/19/2013    3:38 AM  BMP  Glucose 70 - 99 mg/dL 88  147  92   BUN 6 - 23 mg/dL 14  19  12    Creatinine 0.40 - 1.20 mg/dL 8.29  5.62  1.30   Sodium 135 - 145 mEq/L 137  138  141   Potassium 3.5 - 5.1 mEq/L 3.9  3.8  3.5   Chloride 96 - 112 mEq/L 107  103  102   CO2 19 - 32 mEq/L 26  24    Calcium 8.4 - 10.5 mg/dL 9.4  9.8     Chest imaging: CXR 03/18/22 Heart size within normal limits. No appreciable airspace consolidation. No evidence of pleural effusion or pneumothorax. No acute bony abnormality identified.  PFT:    Latest Ref Rng & Units 11/25/2020    1:51 PM 02/24/2020   12:08 PM  PFT Results  FVC-Pre L 1.75  2.70   FVC-Predicted Pre % 44  69   FVC-Post L 1.61  2.52   FVC-Predicted Post % 41  64   Pre FEV1/FVC % % 85  75   Post FEV1/FCV % % 82  81   FEV1-Pre L 1.49  2.03   FEV1-Predicted Pre % 46  63   FEV1-Post L 1.32  2.03   DLCO uncorrected ml/min/mmHg 23.07  27.50   DLCO UNC% % 84  100   DLCO  corrected ml/min/mmHg 23.07  27.50   DLCO COR %Predicted % 84  100   DLVA Predicted % 223  149   TLC L 4.40  4.62   TLC % Predicted % 72  75   RV % Predicted % 102  111   PFTs 2022: Mild restriction   Labs:  Path:  Echo:  Heart Catheterization:     Assessment & Plan:   Moderate persistent asthmatic bronchitis with acute exacerbation - Plan: predniSONE (DELTASONE) 10 MG tablet, IgE, CBC with Differential/Platelet  Discussion: Kaitlyn Hays is a 42 year old woman, never smoker with asthma who returns to pulmonary clinic for acute visit.   She has moderate to severe persistent asthma with acute exacerbation. She has required multiple steroid taper treatments each year over recent years. Absolute eosinophil level has ranged 400-600.   She is to stop symbicort inhaler and try breztri inhaler 2 puffs twice daily with spacer. She is to use albuterol inhaler as needed. Continue montelukast daily.   We will check IgE and eosinophil levels today. If she has another requirement for prednisone despite being on ICS/LABA/LAMA therapy, recommend starting biologic therapy.  Follow up in 3 months.   Melody Comas, MD [facility] Office: [phone]    Current Outpatient Medications:    albuterol (PROVENTIL) (2.5 MG/3ML) 0.083% nebulizer solution, INHALE 1 VIAL PER NEBULIZER EVERY 6 HOURS AS NEEDED FOR WHEEZING OR  SHORTNESS OF BREATH, Disp: 75 mL, Rfl: 3   albuterol (VENTOLIN HFA) 108 (90 Base) MCG/ACT inhaler, Inhale 1-2 puffs into the lungs every 6 (six) hours as needed for wheezing or shortness of breath., Disp: 8 g, Rfl: 5   Budeson-Glycopyrrol-Formoterol (BREZTRI AEROSPHERE) 160-9-4.8 MCG/ACT AERO, Inhale 2 puffs into the lungs in the morning and at bedtime., Disp: , Rfl:    budesonide-formoterol (SYMBICORT) 160-4.5 MCG/ACT inhaler, Inhale 2 puffs into the lungs 2 (two) times daily., Disp: 1 each, Rfl: 6   cetirizine (ZYRTEC) 10 MG tablet, Take 10 mg by mouth  daily., Disp: , Rfl:    megestrol (MEGACE) 40 MG tablet, Take 1 tablet (40 mg total) by mouth 2 (two) times daily. Can increase to two tablets twice a day in the event of heavy bleeding, Disp: 60 tablet, Rfl: 5   montelukast (SINGULAIR) 10 MG tablet, TAKE 1 TABLET(10 MG) BY MOUTH AT BEDTIME, Disp: 30 tablet, Rfl: 11   predniSONE (DELTASONE) 10 MG tablet, Take 4 tablets (40 mg total) by mouth daily with breakfast for 3 days, THEN 3 tablets (30 mg total) daily with breakfast for 3 days, THEN 2 tablets (20 mg total) daily with breakfast for 3 days, THEN 1 tablet (10 mg total) daily with breakfast for 3 days., Disp: 30 tablet, Rfl: 0  Current Facility-Administered Medications:    ipratropium-albuterol (DUONEB) 0.5-2.5 (3) MG/3ML nebulizer solution 3 mL, 3 mL, Nebulization, Daily PRN, Cobb, Katherine V, NP, 3 mL at 03/18/22 1343

## 2023-03-08 ENCOUNTER — Ambulatory Visit: Payer: BC Managed Care – PPO | Admitting: Obstetrics and Gynecology

## 2023-03-20 ENCOUNTER — Ambulatory Visit (INDEPENDENT_AMBULATORY_CARE_PROVIDER_SITE_OTHER): Payer: BC Managed Care – PPO | Admitting: Obstetrics and Gynecology

## 2023-03-20 ENCOUNTER — Encounter: Payer: Self-pay | Admitting: Obstetrics and Gynecology

## 2023-03-20 ENCOUNTER — Other Ambulatory Visit: Payer: Self-pay

## 2023-03-20 VITALS — BP 123/82 | HR 106 | Wt 253.9 lb

## 2023-03-20 DIAGNOSIS — N939 Abnormal uterine and vaginal bleeding, unspecified: Secondary | ICD-10-CM

## 2023-03-20 DIAGNOSIS — D219 Benign neoplasm of connective and other soft tissue, unspecified: Secondary | ICD-10-CM

## 2023-03-20 NOTE — Progress Notes (Unsigned)
GYNECOLOGY VISIT  Patient name: Kaitlyn Hays MRN [id number]  Date of birth: 10-22-1980 Chief Complaint:   Gynecologic Exam  History:  Kaitlyn Hays is a 42 y.o. G0P0000 being seen today for AUB-L.  Wants to discuss possible surgical optins.n ochanges since alyear ago. Feels like her stomach is growin. Taking megace. Tired of taking pills daily. Had one menses in may because she ran out of meds. Originally seen for fibroid removal but told she needed a hysterectomy.   Did want to continue to have the option to conceive/be pregnant but aware that it would be difficult at her current age.   Would consider [place]  Works at [employer] and [employer] afternoons  Past Medical History:  Diagnosis Date   Anemia    Asthma    Bronchitis    Fibroids    Gonorrhea    Tibia fracture 08/07/2013    Past Surgical History:  Procedure Laterality Date   NO PAST SURGERIES     TIBIA IM NAIL INSERTION Left 08/07/2013   Procedure: INTRAMEDULLARY (IM) NAIL TIBIAL;  Surgeon: Shelda Pal, MD;  Location: [facility];  Service: Orthopedics;  Laterality: Left;    The following portions of the patient's history were reviewed and updated as appropriate: allergies, current medications, past family history, past medical history, past social history, past surgical history and problem list.   Health Maintenance:   Last pap     Component Value Date/Time   DIAGPAP  10/19/2022 1156    - Negative for intraepithelial lesion or malignancy (NILM)   DIAGPAP (A) 07/22/2021 0837    - Atypical squamous cells of undetermined significance (ASC-US)   DIAGPAP (A) 08/19/2019 1416    - Atypical squamous cells of undetermined significance (ASC-US)   HPVHIGH Negative 10/19/2022 1156   HPVHIGH Positive (A) 07/22/2021 0837   HPVHIGH Positive (A) 08/19/2019 1416   ADEQPAP  10/19/2022 1156    Satisfactory for evaluation; transformation zone component ABSENT.   ADEQPAP  07/22/2021 0837    Satisfactory for evaluation;  transformation zone component PRESENT.   ADEQPAP  08/19/2019 1416    Satisfactory for evaluation; transformation zone component PRESENT.    High Risk HPV: Positive  Adequacy:  Satisfactory for evaluation, transformation zone component PRESENT  Diagnosis:  Atypical squamous cells of undetermined significance (ASC-US)  Last mammogram: n/a   Review of Systems:  Pertinent items are noted in HPI. Comprehensive review of systems was otherwise negative.   Objective:  Physical Exam BP 123/82   Pulse (!) 106   Wt 253 lb 14.4 oz (115.2 kg)   BMI 35.41 kg/m    Physical Exam Vitals and nursing note reviewed.  Constitutional:      Appearance: Normal appearance.  HENT:     Head: Normocephalic and atraumatic.  Pulmonary:     Effort: Pulmonary effort is normal.  Abdominal:     Comments: Enlarged uterus  Skin:    General: Skin is warm and dry.  Neurological:     General: No focal deficit present.     Mental Status: She is alert.  Psychiatric:        Mood and Affect: Mood normal.        Behavior: Behavior normal.        Thought Content: Thought content normal.        Judgment: Judgment normal.        Assessment & Plan:   1. Abnormal uterine bleeding (AUB) 2. Fibroids Pelvic US to assess current size  and location of uterus. Most recent H/H within normal limits. Discussed various management options for AUB and fibroids. At this time, patient reasonably sure she is ok with pursuing options that would prevent her from carrying a pregnancy. Will continue megace for now. Discussed expected recovery from different procedures. All questions answered. Noted that removal or reduction of fibroids may help with some abdominal bloating and growth but not necessarily overall weight control.  - US PELVIC COMPLETE WITH TRANSVAGINAL; Future   Routine preventative health maintenance measures emphasized.  Lorriane Shire, MD [facility] for [facility]  Healthcare, [facility]

## 2023-03-30 ENCOUNTER — Ambulatory Visit (HOSPITAL_COMMUNITY)
Admission: RE | Admit: 2023-03-30 | Discharge: 2023-03-30 | Disposition: A | Discharge status: Home / Self Care | Payer: BC Managed Care – PPO | Source: Ambulatory Visit | Attending: Obstetrics and Gynecology | Admitting: Obstetrics and Gynecology

## 2023-03-30 DIAGNOSIS — N939 Abnormal uterine and vaginal bleeding, unspecified: Secondary | ICD-10-CM | POA: Insufficient documentation

## 2023-04-06 ENCOUNTER — Other Ambulatory Visit: Payer: Self-pay

## 2023-04-06 ENCOUNTER — Ambulatory Visit: Payer: BC Managed Care – PPO | Admitting: Obstetrics and Gynecology

## 2023-04-06 ENCOUNTER — Encounter: Payer: Self-pay | Admitting: Obstetrics and Gynecology

## 2023-04-06 VITALS — BP 136/91 | HR 112 | Wt 257.0 lb

## 2023-04-06 DIAGNOSIS — D219 Benign neoplasm of connective and other soft tissue, unspecified: Secondary | ICD-10-CM | POA: Diagnosis not present

## 2023-04-06 DIAGNOSIS — N939 Abnormal uterine and vaginal bleeding, unspecified: Secondary | ICD-10-CM

## 2023-04-06 NOTE — Progress Notes (Signed)
GYNECOLOGY VISIT  Patient name: Kaitlyn Hays MRN [id number]  Date of birth: [birth year] Chief Complaint:   Results and Follow-up  History:  Kaitlyn Hays is a 42 y.o. G0P0000 being seen today for AUB-L follow up.  Currently taking megace once daily which has her amenorrheic. Does not want to keep taking megace. Is interested in surgical management of her fibroids and bleeding. Is aware that likelihood of spontaneous pregnancy is low.   Past Medical History:  Diagnosis Date   Anemia    Asthma    Bronchitis    Fibroids    Gonorrhea    Tibia fracture 08/07/2013    Past Surgical History:  Procedure Laterality Date   NO PAST SURGERIES     TIBIA IM NAIL INSERTION Left 08/07/2013   Procedure: INTRAMEDULLARY (IM) NAIL TIBIAL;  Surgeon: Kaitlyn Pal, MD;  Location: [facility];  Service: Orthopedics;  Laterality: Left;    The following portions of the patient's history were reviewed and updated as appropriate: allergies, current medications, past family history, past medical history, past social history, past surgical history and problem list.   Health Maintenance:   Last pap     Component Value Date/Time   DIAGPAP  10/19/2022 1156    - Negative for intraepithelial lesion or malignancy (NILM)   DIAGPAP (A) 07/22/2021 0837    - Atypical squamous cells of undetermined significance (ASC-US)   DIAGPAP (A) 08/19/2019 1416    - Atypical squamous cells of undetermined significance (ASC-US)   HPVHIGH Negative 10/19/2022 1156   HPVHIGH Positive (A) 07/22/2021 0837   HPVHIGH Positive (A) 08/19/2019 1416   ADEQPAP  10/19/2022 1156    Satisfactory for evaluation; transformation zone component ABSENT.   ADEQPAP  07/22/2021 0837    Satisfactory for evaluation; transformation zone component PRESENT.   ADEQPAP  08/19/2019 1416    Satisfactory for evaluation; transformation zone component PRESENT.    High Risk HPV: Positive  Adequacy:  Satisfactory for evaluation, transformation zone  component PRESENT  Diagnosis:  Atypical squamous cells of undetermined significance (ASC-US)  Last mammogram: 10/2022 BIRAD 1   Review of Systems:  Pertinent items are noted in HPI. Comprehensive review of systems was otherwise negative.   Objective:  Physical Exam BP (!) 136/91   Pulse (!) 112   Wt 257 lb 0.3 oz (116.6 kg)   BMI 35.85 kg/m    Physical Exam Vitals and nursing note reviewed.  Constitutional:      Appearance: Normal appearance.  HENT:     Head: Normocephalic and atraumatic.  Pulmonary:     Effort: Pulmonary effort is normal.  Skin:    General: Skin is warm and dry.  Neurological:     General: No focal deficit present.     Mental Status: She is alert.  Psychiatric:        Mood and Affect: Mood normal.        Behavior: Behavior normal.        Thought Content: Thought content normal.        Judgment: Judgment normal.      Labs and Imaging US PELVIC COMPLETE WITH TRANSVAGINAL  Result Date: 04/06/2023 CLINICAL DATA:  Abnormal uterine bleeding Fibroids EXAM: TRANSABDOMINAL AND TRANSVAGINAL ULTRASOUND OF PELVIS TECHNIQUE: Both transabdominal and transvaginal ultrasound examinations of the pelvis were performed. Transabdominal technique was performed for global imaging of the pelvis including uterus, ovaries, adnexal regions, and pelvic cul-de-sac. It was necessary to proceed with endovaginal exam following the transabdominal exam to  visualize the endometrium. COMPARISON:  09/04/2019 FINDINGS: Uterus Measurements: 15.4 x 8.4 x 7.7 cm = volume: 525 mL. Three fibroids are seen with the largest measuring 7.6 x 7.4 x 7.5 cm. Endometrium Thickness: 5 mm. Small amount of fluid present within the endometrial cavity. 8 mm echogenic structure noted within the endometrial cavity. Right ovary Measurements: 3.3 x 1.6 x 1.5 cm = volume: 4.2 mL. Normal appearance. Left ovary Measurements: 3.3 x 1.6 x 1.3 cm = volume: 3.5 mL. Normal appearance. Other findings No abnormal free  fluid. IMPRESSION: 1. Enlarged fibroid uterus. 2. 8 mm echogenic structure noted within the intramural cavity does not demonstrate significant flow and is favored to be blood products. However could also represent polyp. Consider further evaluation with sonohysterogram for confirmation prior to hysteroscopy. Endometrial sampling should also be considered if patient is at high risk for endometrial carcinoma. (Ref: Radiological Reasoning: Algorithmic Workup of Abnormal Vaginal Bleeding with Endovaginal Sonography and Sonohysterography. AJR 2008; 161:W96-04) Electronically Signed   By: Kaitlyn Hays M.D.   On: 04/06/2023 14:06       Assessment & Plan:   1. Abnormal uterine bleeding (AUB) 2. Fibroids Reviewed ultrasound findings that fibroids are more or less unchanged from prior examinations.  Discussed options for management, including surgical management.  Patient would like to proceed with hysterectomy, as definitive management.  She no longer wants to take daily medication to control her periods, and is accepting that when she has had a hysterectomy she is no longer able to carry a pregnancy. Bleeding is currently well controlled with megace. Bothered by bulk and needing medication to control menses.    Patient desires surgical management with RA-TLH, BS, cysto.  The risks of surgery were discussed in detail with the patient including but not limited to: bleeding which may require transfusion or reoperation; infection which may require prolonged hospitalization or re-hospitalization and antibiotic therapy; injury to bowel, bladder, ureters and major vessels or other surrounding organs which may lead to other procedures; formation of adhesions; need for additional procedures including laparotomy or subsequent procedures secondary to intraoperative injury or abnormal pathology; thromboembolic phenomenon; incisional problems and other postoperative or anesthesia complications.  The postoperative expectations  were also discussed in detail. The patient also understands the alternative treatment options which were discussed in full. All questions were answered.  She was told that she will be contacted by our surgical scheduler regarding the time and date of her surgery; routine preoperative instructions will be given to her by the preoperative nursing team.     Routine preventative health maintenance measures emphasized.  Lorriane Shire, MD Minimally Invasive Gynecologic [facility] for [facility], [facility]

## 2023-05-09 ENCOUNTER — Telehealth: Payer: Self-pay

## 2023-05-09 NOTE — Telephone Encounter (Signed)
Called patient to see if she was available for surgery with Dr. Briscoe Deutscher on 05/16/23 @[facility] . Unable to leave voicemail, patients mailbox is full

## 2023-07-05 ENCOUNTER — Ambulatory Visit: Payer: Self-pay | Admitting: Pulmonary Disease

## 2023-07-06 ENCOUNTER — Other Ambulatory Visit: Payer: Self-pay | Admitting: Primary Care

## 2023-07-11 ENCOUNTER — Ambulatory Visit (INDEPENDENT_AMBULATORY_CARE_PROVIDER_SITE_OTHER): Payer: Self-pay | Admitting: Pulmonary Disease

## 2023-07-11 ENCOUNTER — Encounter: Payer: Self-pay | Admitting: Pulmonary Disease

## 2023-07-11 VITALS — BP 134/84 | HR 101 | Temp 98.4°F | Ht 71.0 in | Wt 257.2 lb

## 2023-07-11 DIAGNOSIS — J45901 Unspecified asthma with (acute) exacerbation: Secondary | ICD-10-CM

## 2023-07-11 DIAGNOSIS — R0602 Shortness of breath: Secondary | ICD-10-CM

## 2023-07-11 MED ORDER — AZITHROMYCIN 250 MG PO TABS: ORAL_TABLET | ORAL | 0 refills | Status: DC

## 2023-07-11 NOTE — Progress Notes (Unsigned)
Kaitlyn Hays    [id number]    [birth year]  Primary Care Physician:Reese, Jocelyn Lamer, MD  Referring Physician: Leilani Able, MD [address] [place],  [place] [address]  Chief complaint:   Acute visit for asthma exacerbation  HPI:  Patient with known moderate persistent asthma  States she has been needing to use steroids almost every 2 to 3 months Known to have asthma, compliant with inhalers, currently on Breztri  She does have a cough, congestion, she had some prednisone at home which she started taking.  She does have more symptoms usually around fall and spring  Had discussed about Biologics previously  Never smoker, secondhand smoke exposure when she was younger, spouse smokes outside the house  She does have a background history of allergic rhinitis, GERD, IDA, fibroids  History of obstructive sleep apnea that is mild with an AHI of 7.1  No pets, no family history of asthma Outpatient Encounter Medications as of 07/11/2023  Medication Sig   albuterol (PROVENTIL) (2.5 MG/3ML) 0.083% nebulizer solution INHALE 1 VIAL PER NEBULIZER EVERY 6 HOURS AS NEEDED FOR WHEEZING OR SHORTNESS OF BREATH   albuterol (VENTOLIN HFA) 108 (90 Base) MCG/ACT inhaler Inhale 1-2 puffs into the lungs every 6 (six) hours as needed for wheezing or shortness of breath.   azithromycin (ZITHROMAX Z-PAK) 250 MG tablet Take 2 tablets day 1 and then 1 daily for 4 days   Budeson-Glycopyrrol-Formoterol (BREZTRI AEROSPHERE) 160-9-4.8 MCG/ACT AERO Inhale 2 puffs into the lungs in the morning and at bedtime.   budesonide-formoterol (SYMBICORT) 160-4.5 MCG/ACT inhaler Inhale 2 puffs into the lungs 2 (two) times daily.   cetirizine (ZYRTEC) 10 MG tablet Take 10 mg by mouth daily.   megestrol (MEGACE) 40 MG tablet Take 1 tablet (40 mg total) by mouth 2 (two) times daily. Can increase to two tablets twice a day in the event of heavy bleeding   montelukast (SINGULAIR) 10 MG tablet TAKE 1 TABLET(10 MG) BY  MOUTH AT BEDTIME   Facility-Administered Encounter Medications as of 07/11/2023  Medication   ipratropium-albuterol (DUONEB) 0.5-2.5 (3) MG/3ML nebulizer solution 3 mL    Allergies as of 07/11/2023   (No Known Allergies)    Past Medical History:  Diagnosis Date   Anemia    Asthma    Bronchitis    Fibroids    Gonorrhea    Tibia fracture 08/07/2013    Past Surgical History:  Procedure Laterality Date   NO PAST SURGERIES     TIBIA IM NAIL INSERTION Left 08/07/2013   Procedure: INTRAMEDULLARY (IM) NAIL TIBIAL;  Surgeon: Shelda Pal, MD;  Location: [facility];  Service: Orthopedics;  Laterality: Left;    Family History  Problem Relation Age of Onset   Hypertension Other     Social History   Socioeconomic History   Marital status: Significant Other    Spouse name: Not on file   Number of children: Not on file   Years of education: Not on file   Highest education level: Not on file  Occupational History   Not on file  Tobacco Use   Smoking status: Never   Smokeless tobacco: Never  Vaping Use   Vaping status: Never Used  Substance and Sexual Activity   Alcohol use: No   Drug use: No   Sexual activity: Yes    Partners: Female    Birth control/protection: Inserts  Other Topics Concern   Not on file  Social History Narrative  Not on file   Social Drivers of Health   Financial Resource Strain: Not on file  Food Insecurity: No Food Insecurity (04/06/2023)   Hunger Vital Sign    Worried About Running Out of Food in the Last Year: Never true    Ran Out of Food in the Last Year: Never true  Transportation Needs: No Transportation Needs (04/06/2023)   PRAPARE - Administrator, Civil Service (Medical): No    Lack of Transportation (Non-Medical): No  Physical Activity: Not on file  Stress: Not on file  Social Connections: Not on file  Intimate Partner Violence: Not on file    Review of Systems  Respiratory:  Positive for shortness of breath.      Vitals:   07/11/23 1005  BP: 134/84  Pulse: (!) 101  Temp: 98.4 F (36.9 C)  SpO2: 97%     Physical Exam Constitutional:      Appearance: She is obese.  HENT:     Head: Normocephalic.     Nose: Nose normal.     Mouth/Throat:     Mouth: Mucous membranes are moist.  Eyes:     General: No scleral icterus. Cardiovascular:     Rate and Rhythm: Normal rate and regular rhythm.     Heart sounds: No murmur heard.    No friction rub.  Pulmonary:     Effort: No respiratory distress.     Breath sounds: No stridor. No wheezing or rhonchi.  Musculoskeletal:     Cervical back: No rigidity or tenderness.  Neurological:     Mental Status: She is alert.  Psychiatric:        Mood and Affect: Mood normal.      Data Reviewed: Recent office visits reviewed  Assessment:  Asthma with uncontrolled symptoms Elevated IgE and eosinophil levels  Recurrent exacerbation requiring steroids about every 2 to 3 months  Encouraged to avoid known triggers    Plan/Recommendations: Complete current course of prednisone  Prescription for azithromycin sent to pharmacy  May need to consider Biologics for asthma -Encouraged patient to do some more research on the Biologics  Schedule for pulmonary function test  Schedule for [place] level at next visit  Follow-up in 4 to 6 weeks   Virl Diamond MD [facility] 07/11/2023, 9:37 PM  CC: Leilani Able, MD

## 2023-07-11 NOTE — Patient Instructions (Signed)
Prescription for antibiotic will be sent to pharmacy for you-azithromycin  Finish the course of prednisone that you are using right now  Do some research on Biologics for asthma -Example will be Tezspire, Dupixent, Harrington Challenger, Xolair-injectable medications that we escalate to if inhalers are not working or you are needing steroids very frequently  Schedule for both PFT at next visit  FeNo level at next visit  Follow-up in 4 to 6 weeks

## 2023-07-25 ENCOUNTER — Other Ambulatory Visit: Payer: Self-pay | Admitting: Family Medicine

## 2023-07-25 DIAGNOSIS — Z1231 Encounter for screening mammogram for malignant neoplasm of breast: Secondary | ICD-10-CM

## 2023-07-26 ENCOUNTER — Other Ambulatory Visit: Payer: Self-pay | Admitting: Obstetrics and Gynecology

## 2023-07-26 DIAGNOSIS — N938 Other specified abnormal uterine and vaginal bleeding: Secondary | ICD-10-CM

## 2023-08-16 ENCOUNTER — Telehealth: Payer: Self-pay

## 2023-08-16 NOTE — Telephone Encounter (Signed)
 Patient called RN line regarding her surgery. Patient reports that she thought she would be scheduled for surgery by now and would like to know the status of surgery.  Marcelino Duster, RN

## 2023-08-22 ENCOUNTER — Telehealth: Payer: Self-pay | Admitting: Family Medicine

## 2023-08-22 ENCOUNTER — Telehealth: Payer: Self-pay

## 2023-08-22 NOTE — Telephone Encounter (Signed)
 Good morning, Patient was seen here on 04/06/2023, States she was told that she will get a call to schedule her surgery in the beginning of the year and it is now March and she still have no information.

## 2023-08-22 NOTE — Telephone Encounter (Signed)
 Patient called to arrange surgery w/ Dr. Briscoe Deutscher. Patient states she has new [employer] and will upload via Mychart and call me back.

## 2023-09-08 ENCOUNTER — Telehealth: Payer: Self-pay

## 2023-09-08 NOTE — Telephone Encounter (Signed)
 Called patient to provide surgery details. I left a voicemail advising the patient is scheduled at [facility] on 09/18/23 at 1 pm and patient will need to arrive by 11 am. I asked patient to call me for more details at [phone].

## 2023-09-14 ENCOUNTER — Telehealth: Payer: Self-pay

## 2023-09-14 NOTE — Telephone Encounter (Signed)
 I called patient to see if she wants to reschedule her surgery for 11/14/23? Patient has a trip on 6/28 and will reschedule once Dr. Briscoe Deutscher July schedule is released.

## 2023-09-18 ENCOUNTER — Ambulatory Visit (HOSPITAL_COMMUNITY): Admit: 2023-09-18 | Admitting: Obstetrics and Gynecology

## 2023-09-18 DIAGNOSIS — D219 Benign neoplasm of connective and other soft tissue, unspecified: Secondary | ICD-10-CM

## 2023-09-18 SURGERY — HYSTERECTOMY, TOTAL, LAPAROSCOPIC, WITH SALPINGECTOMY
Anesthesia: Choice

## 2023-09-20 ENCOUNTER — Ambulatory Visit: Payer: Self-pay | Admitting: Pulmonary Disease

## 2023-09-26 ENCOUNTER — Other Ambulatory Visit: Payer: Self-pay

## 2023-09-26 ENCOUNTER — Encounter (HOSPITAL_COMMUNITY): Payer: Self-pay | Admitting: Emergency Medicine

## 2023-09-26 ENCOUNTER — Emergency Department (HOSPITAL_COMMUNITY)
Admission: EM | Admit: 2023-09-26 | Discharge: 2023-09-26 | Disposition: A | Discharge status: Home / Self Care | Attending: Emergency Medicine | Admitting: Emergency Medicine

## 2023-09-26 DIAGNOSIS — T23152A Burn of first degree of left palm, initial encounter: Secondary | ICD-10-CM | POA: Insufficient documentation

## 2023-09-26 DIAGNOSIS — X152XXA Contact with hotplate, initial encounter: Secondary | ICD-10-CM | POA: Insufficient documentation

## 2023-09-26 DIAGNOSIS — T23151A Burn of first degree of right palm, initial encounter: Secondary | ICD-10-CM | POA: Diagnosis present

## 2023-09-26 DIAGNOSIS — T23159A Burn of first degree of unspecified palm, initial encounter: Secondary | ICD-10-CM

## 2023-09-26 MED ORDER — OXYCODONE-ACETAMINOPHEN 5-325 MG PO TABS
1.0000 | ORAL_TABLET | Freq: Once | ORAL | Status: AC
  Administered 2023-09-26: 1 via ORAL
  Filled 2023-09-26: qty 1

## 2023-09-26 MED ORDER — OXYCODONE-ACETAMINOPHEN 5-325 MG PO TABS: 1.0000 | ORAL_TABLET | Freq: Four times a day (QID) | ORAL | 0 refills | Status: DC | PRN

## 2023-09-26 NOTE — Discharge Instructions (Addendum)
 Keep ice on the area over the next day.  Pain should improve over the next 24 hours.  I sent you a few oxycodone with Tylenol pain pills.  You can take this as needed for severe pain.
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
## 2023-10-09 ENCOUNTER — Encounter (HOSPITAL_BASED_OUTPATIENT_CLINIC_OR_DEPARTMENT_OTHER): Payer: Self-pay

## 2023-10-11 ENCOUNTER — Ambulatory Visit (INDEPENDENT_AMBULATORY_CARE_PROVIDER_SITE_OTHER): Payer: Self-pay | Admitting: Pulmonary Disease

## 2023-10-11 DIAGNOSIS — R0602 Shortness of breath: Secondary | ICD-10-CM

## 2023-10-11 LAB — PULMONARY FUNCTION TEST
DL/VA % pred: 181 %
DL/VA: 7.56 ml/min/mmHg/L
DLCO unc % pred: 71 %
DLCO unc: 19.41 ml/min/mmHg
FEF 25-75 Post: 0.86 L/s
FEF 25-75 Pre: 0.78 L/s
FEF2575-%Change-Post: 11 %
FEF2575-%Pred-Post: 25 %
FEF2575-%Pred-Pre: 22 %
FEV1-%Change-Post: 1 %
FEV1-%Pred-Post: 32 %
FEV1-%Pred-Pre: 32 %
FEV1-Post: 1.2 L
FEV1-Pre: 1.18 L
FEV1FVC-%Change-Post: 7 %
FEV1FVC-%Pred-Pre: 88 %
FEV6-%Change-Post: -5 %
FEV6-%Pred-Post: 34 %
FEV6-%Pred-Pre: 36 %
FEV6-Post: 1.55 L
FEV6-Pre: 1.63 L
FEV6FVC-%Pred-Post: 102 %
FEV6FVC-%Pred-Pre: 102 %
FVC-%Change-Post: -5 %
FVC-%Pred-Post: 34 %
FVC-%Pred-Pre: 35 %
FVC-Post: 1.55 L
FVC-Pre: 1.63 L
Post FEV1/FVC ratio: 78 %
Post FEV6/FVC ratio: 100 %
Pre FEV1/FVC ratio: 72 %
Pre FEV6/FVC Ratio: 100 %
RV % pred: 150 %
RV: 3.02 L
TLC % pred: 78 %
TLC: 4.82 L

## 2023-10-11 NOTE — Progress Notes (Signed)
 Full pft performed today.

## 2023-10-11 NOTE — Patient Instructions (Signed)
 Full pft performed today.

## 2023-10-17 ENCOUNTER — Encounter: Payer: Self-pay | Admitting: Internal Medicine

## 2023-10-17 ENCOUNTER — Ambulatory Visit: Admitting: Internal Medicine

## 2023-10-17 VITALS — BP 137/85 | HR 88 | Ht 71.0 in | Wt 258.2 lb

## 2023-10-17 DIAGNOSIS — J4540 Moderate persistent asthma, uncomplicated: Secondary | ICD-10-CM | POA: Diagnosis not present

## 2023-10-17 DIAGNOSIS — R0789 Other chest pain: Secondary | ICD-10-CM | POA: Diagnosis not present

## 2023-10-17 MED ORDER — FLUTICASONE-SALMETEROL 500-50 MCG/ACT IN AEPB: 1.0000 | INHALATION_SPRAY | Freq: Two times a day (BID) | RESPIRATORY_TRACT | 5 refills | Status: DC

## 2023-10-17 NOTE — Patient Instructions (Addendum)
 It was a pleasure to see you today!  I think your chest pain could be related to a side effect of the Breztri .  Why do not we try stopping the Breztri  and switching you to Advair  500 1 puff twice daily, gargle after use.  This is your maintenance inhaler for asthma and you should take it every day.  In between for symptoms of asthma you can take the albuterol  inhaler as needed up to 4 times a day.  I do not think you need prednisone  today.  If your symptoms of chest pain and tightness are not getting better with prednisone  I think it is highly unlikely that your symptoms are related to asthma.  I think we did explore other explanations for why you are having chest pain.  Keep your follow-up with Dr. Magda Schneider next month so we can see how the new inhaler is doing.  We discussed potentially starting an injectable medication for your asthma.  However, I think if your chest pain is not related to asthma we need to discuss those other explanations such as seeing a cardiologist or working with PCP.

## 2023-10-17 NOTE — Progress Notes (Signed)
 Kaitlyn Hays    [id number]    1981/01/22  Primary Care Physician:Reese, Cathe Clore, MD Date of Appointment: 10/17/2023 Established Patient Visit  Chief complaint:   Chief Complaint  Patient presents with   Acute Visit    Chest pain x1 mth     HPI: Kaitlyn Hays is a 43 y.o. woman with history of eosinophilic asthma, osa on cpap, GERD, rhinitis.   Interval Updates: Here for acute visit. Frequent exacerbations. Today she is here for chest pain for the past month. She started breztri  2 puffs twice daily and ever since then. She experiences chest tightness while she's working. She has chest pain at night as well. It is also at rest. She is taking albuterol  rescue inhaler about four times a day and doesn't feel like it's consistently helping her chest pain.   Current Regimen: breztri  2 puffs twice daily, albuterol  prn Asthma Triggers: stress, strong smells, seasonal allergies Exacerbations in the last year: multiple History of hospitalization or intubation: never Allergy Testing/rhinitis: d GERD: Denies ACT:  Asthma Control Test ACT Total Score  07/11/2023 10:06 AM 6  08/24/2022  2:29 PM 11  03/18/2022  1:06 PM 6   FeNO: Serum Eos/IgE:  eos 300 total ige elevated, no specific aeroallergens I have reviewed the patient's family social and past medical history and updated as appropriate.   Past Medical History:  Diagnosis Date   Anemia    Asthma    Bronchitis    Fibroids    Gonorrhea    Tibia fracture 08/07/2013    Past Surgical History:  Procedure Laterality Date   NO PAST SURGERIES     TIBIA IM NAIL INSERTION Left 08/07/2013   Procedure: INTRAMEDULLARY (IM) NAIL TIBIAL;  Surgeon: Bevin Bucks, MD;  Location: [facility];  Service: Orthopedics;  Laterality: Left;    Family History  Problem Relation Age of Onset   Hypertension Other     Social History   Occupational History   Not on file  Tobacco Use   Smoking status: Never   Smokeless tobacco: Never   Vaping Use   Vaping status: Never Used  Substance and Sexual Activity   Alcohol use: No   Drug use: No   Sexual activity: Yes    Partners: Female    Birth control/protection: Inserts     Physical Exam: Blood pressure 137/85, pulse 88, height 5\' 11"  (1.803 m), weight 258 lb 3.2 oz (117.1 kg), SpO2 100%.  Gen:      No acute distress Lungs:    No increased respiratory effort, symmetric chest wall excursion, clear to auscultation bilaterally, no wheezes or crackles CV:         Regular rate and rhythm; no murmurs, rubs, or gallops.  No pedal edema   Data Reviewed: Imaging: I have personally reviewed the chest xray 03/2022 - no acute cardiopulmonary process  PFTs:     Latest Ref Rng & Units 10/11/2023   10:53 AM 11/25/2020    1:51 PM 02/24/2020   12:08 PM  PFT Results  FVC-Pre L 1.63  1.75  2.70   FVC-Predicted Pre % 35  44  69   FVC-Post L 1.55  1.61  2.52   FVC-Predicted Post % 34  41  64   Pre FEV1/FVC % % 72  85  75   Post FEV1/FCV % % 78  82  81   FEV1-Pre L 1.18  1.49  2.03   FEV1-Predicted Pre %  32  46  63   FEV1-Post L 1.20  1.32  2.03   DLCO uncorrected ml/min/mmHg 19.41  23.07  27.50   DLCO UNC% % 71  84  100   DLCO corrected ml/min/mmHg  23.07  27.50   DLCO COR %Predicted %  84  100   DLVA Predicted % 181  223  149   TLC L 4.82  4.40  4.62   TLC % Predicted % 78  72  75   RV % Predicted % 150  102  111    I have personally reviewed the patient's PFTs and mild restriction to ventilation   Labs: Eos elevated  Immunization status: Immunization History  Administered Date(s) Administered   PFIZER(Purple Top)SARS-COV-2 Vaccination 02/21/2020, 03/19/2020    External Records Personally Reviewed: pulmonary  Assessment:  Moderate persistent asthma Chest pain/heaviness  Plan/Recommendations: I think your chest pain could be related to a side effect of the Breztri .  Why do not we try stopping the Breztri  and switching you to Advair  500 1 puff twice daily,  gargle after use.  This is your maintenance inhaler for asthma and you should take it every day.  In between for symptoms of asthma you can take the albuterol  inhaler as needed up to 4 times a day.  I do not think you need prednisone  today.  If your symptoms of chest pain and tightness are not getting better with prednisone  I think it is highly unlikely that your symptoms are related to asthma.  I think we did explore other explanations for why you are having chest pain.  Keep your follow-up with Dr. Magda Schneider next month so we can see how the new inhaler is doing.  We discussed potentially starting an injectable medication for your asthma.  However, I think if your chest pain is not related to asthma we need to discuss those other explanations such as seeing a cardiologist or working with PCP.   Return to Care: As scheduled.    Louie Rover, MD Pulmonary and Critical Care Medicine [facility] [phone]

## 2023-10-18 ENCOUNTER — Ambulatory Visit: Payer: Self-pay | Admitting: Pulmonary Disease

## 2023-10-24 ENCOUNTER — Encounter (HOSPITAL_COMMUNITY): Payer: Self-pay | Admitting: Obstetrics and Gynecology

## 2023-10-26 ENCOUNTER — Encounter (HOSPITAL_COMMUNITY): Payer: Self-pay | Admitting: Obstetrics and Gynecology

## 2023-10-27 ENCOUNTER — Encounter (HOSPITAL_COMMUNITY): Payer: Self-pay | Admitting: Obstetrics and Gynecology

## 2023-10-27 NOTE — Progress Notes (Signed)
 Spoke w/ via phone for pre-op interview---  pt Lab needs dos----  urine preg       Lab results------ lab appt 11-01-2023 @ 1100 getting CBC/ T&S COVID test -----patient states asymptomatic no test needed Arrive at ------- 0930 on 11-02-2023 NPO after MN NO Solid Food.  Clear liquids from MN until--- 0830  Pre-Surgery Ensure or G2: n/a ERAS protocol:  yes  Med rec completed Medications to take morning of surgery ----- advair  inhaler, flonase  nasal spray, gerd mediation Diabetic medication ----- n/a  GLP1 agonist last dose: n/a GLP1 instructions:  Patient instructed no nail polish to be worn day of surgery Patient instructed to bring photo id and insurance card day of surgery Patient aware to have Driver (ride ) / caregiver    for 24 hours after surgery - father, antonio hinson Patient Special Instructions ----- will pick up bag w/ soap and written instructions at lab appt Asked to bring rescue inhaler day of surgery and bring contacts solution/ container Pre-Op special Instructions ----- n/a  Patient verbalized understanding of instructions that were given at this phone interview. Patient denies chest pain, sob, fever, cough at the interview.    Anesthesia Review: moderate persistent asthma (pt stated last exacerbation w/ treatment 3 wks ago);  mild OSA (cpap was recommended , however, pt stated she got the cpap) Pt stated last used nebulizer last night and used rescue inhaler today.   PCP:  Dr B. Monique Ano Pulmonary :  Dr Neale Bale. Dione Franks  Holli Lunger 10-17-2023)  PFTs:  10-11-2023 Chest x-ray :  03-18-2022 EKG :  10-15-2020 Echo : no Stress test: no Cardiac Cath : no  Activity level:  pt stated no sob w/ activities unless having issue with asthma Sleep Study/ CPAP :  yes--- 07-28-2022 in epic Fasting Blood Sugar :      / Checks Blood Sugar -- times a day:    Blood Thinner/ Instructions /Last Dose:no ASA / Instructions/ Last Dose : no

## 2023-10-27 NOTE — Pre-Procedure Instructions (Signed)
 Surgical Instructions   Your procedure is scheduled on :  Thursday,  11-02-2023. Report to [facility] Main Entrance "A" at 9:30  A.M., then check in with the Admitting office. Any questions or running late day of surgery: call [phone]  Questions prior to your surgery date: call [phone], Monday-Friday, 8am-4pm. If you experience any cold or flu symptoms such as cough, fever, chills, shortness of breath, etc. between now and your scheduled surgery, please notify your surgeon office.    Remember:  Do not eat any food after midnight the night before your surgery.  You may have clear liquids from midnight night before surgery until 8:30 AM.   You may drink clear liquids until 8:30 AM the morning of your surgery.   Clear liquids allowed are:  Water Clear Tea (may sweeten, no milk, no honey, etc.) Sport drinks like Gatorade.  NO clear liquids after 8:30 AM day of surgery.  This includes no water,  candy,  gum,  and  mints.    Take these medicines the morning of surgery with A SIPS OF WATER : Fluticasone -salmeterol (advair ) inhaler Fluticasone  (flonase ) nasal spray   May take these medicines IF NEEDED: Albuterol  (ventolin ) inhaler Albuteral (proventil ) nebulizer  ~~ Please bring your albuteral rescue inhaler with you day of surgery   One week prior to surgery, STOP taking any Aspirin (unless otherwise instructed by your surgeon) Aleve , Naproxen , Ibuprofen, Motrin, Advil, Goody's, BC's, all herbal medications, fish oil, and non-prescription vitamins.                     Do NOT Smoke (Tobacco/Vaping) and Do Not drink alcohol for 24 hours prior to your procedure.  If you use a CPAP at night, you may bring your mask/headgear for your overnight stay.   You will be asked to remove any contacts, glasses, piercing's, hearing aid's, dentures/partials prior to surgery. Please bring cases for these items if needed.    Patients discharged the day of surgery will not be allowed to  drive home, and someone needs to stay with them for 24 hours.  SURGICAL WAITING ROOM VISITATION Patients may have no more than 2 support people in the waiting area - these visitors may rotate.   Pre-op nurse will coordinate an appropriate time for 1 ADULT support person, who may not rotate, to accompany patient in pre-op.  Children under the age of 65 must have an adult with them who is not the patient and must remain in the main waiting area with an adult.  If the patient needs to stay at the hospital during part of their recovery, the visitor guidelines for inpatient rooms apply.  Please refer to the [facility] website for the visitor guidelines for any additional information.   If you received a COVID test during your pre-op visit  it is requested that you wear a mask when out in public, stay away from anyone that may not be feeling well and notify your surgeon if you develop symptoms. If you have been in contact with anyone that has tested positive in the last 10 days please notify you surgeon.      Pre-operative CHG Bathing Instructions   You can play a key role in reducing the risk of infection after surgery. Your skin needs to be as free of germs as possible. You can reduce the number of germs on your skin by washing with CHG (chlorhexidine gluconate) soap before surgery. CHG is an antiseptic soap that kills germs and  continues to kill germs even after washing.   DO NOT use if you have an allergy to chlorhexidine/CHG or antibacterial soaps. If your skin becomes reddened or irritated, stop using the CHG and notify Pre-Op nurse day of surgery.  Please get dial soap or other antibacterial soap and shower following the instructions below.             TAKE A SHOWER THE NIGHT BEFORE SURGERY AND THE DAY OF SURGERY    Please keep in mind the following:  DO NOT shave, including legs and underarms, 48 hours prior to surgery.   You may shave your face before/day of surgery.  Place clean  sheets on your bed the night before surgery Use a clean washcloth (not used since being washed) for each shower. DO NOT sleep with pet's night before surgery.  CHG Shower Instructions:  Wash your face and private area with normal soap. If you choose to wash your hair, wash first with your normal shampoo.  After you use shampoo/soap, rinse your hair and body thoroughly to remove shampoo/soap residue.  Turn the water OFF and apply half the bottle of CHG soap to a CLEAN washcloth.  Apply CHG soap ONLY FROM YOUR NECK DOWN TO YOUR TOES (washing for 3-5 minutes)  DO NOT use CHG soap on face, private areas, open wounds, or sores.  Pay special attention to the area where your surgery is being performed.  If you are having back surgery, having someone wash your back for you may be helpful. Wait 2 minutes after CHG soap is applied, then you may rinse off the CHG soap.  Pat dry with a clean towel  Put on clean pajamas    Additional instructions for the day of surgery: DO NOT APPLY any lotions, powder,  oils,  deodorants (may use underarm deodorant) , cologne/  perfumes  or makeup Do not wear jewelry /  piercing's/  metal/  permanent jewelry must be removed prior to arrival day of surgery.  (No plastic piercing) Do not wear nail polish, gel polish, artificial nails, or any other type of covering on natural finger nails (toe nails are okay) Do not bring valuables to the hospital. [facility] is not responsible for valuables/personal belongings. Put on clean/comfortable clothes.  Please brush your teeth.  Ask your nurse before applying any prescription medications to the skin.

## 2023-10-29 IMAGING — MG MM DIGITAL SCREENING BILAT W/ TOMO AND CAD
6 of 10 series · 6 of 26 positions shown · non-contrast
Comparison: Previous exam(s).

CLINICAL DATA: Screening.

EXAM:
DIGITAL SCREENING BILATERAL MAMMOGRAM WITH TOMOSYNTHESIS AND CAD
TECHNIQUE: Bilateral screening digital craniocaudal and mediolateral oblique
mammograms were obtained. Bilateral screening digital breast
tomosynthesis was performed. The images were evaluated with
computer-aided detection.

[R CC synth-2D (1 of 2)]
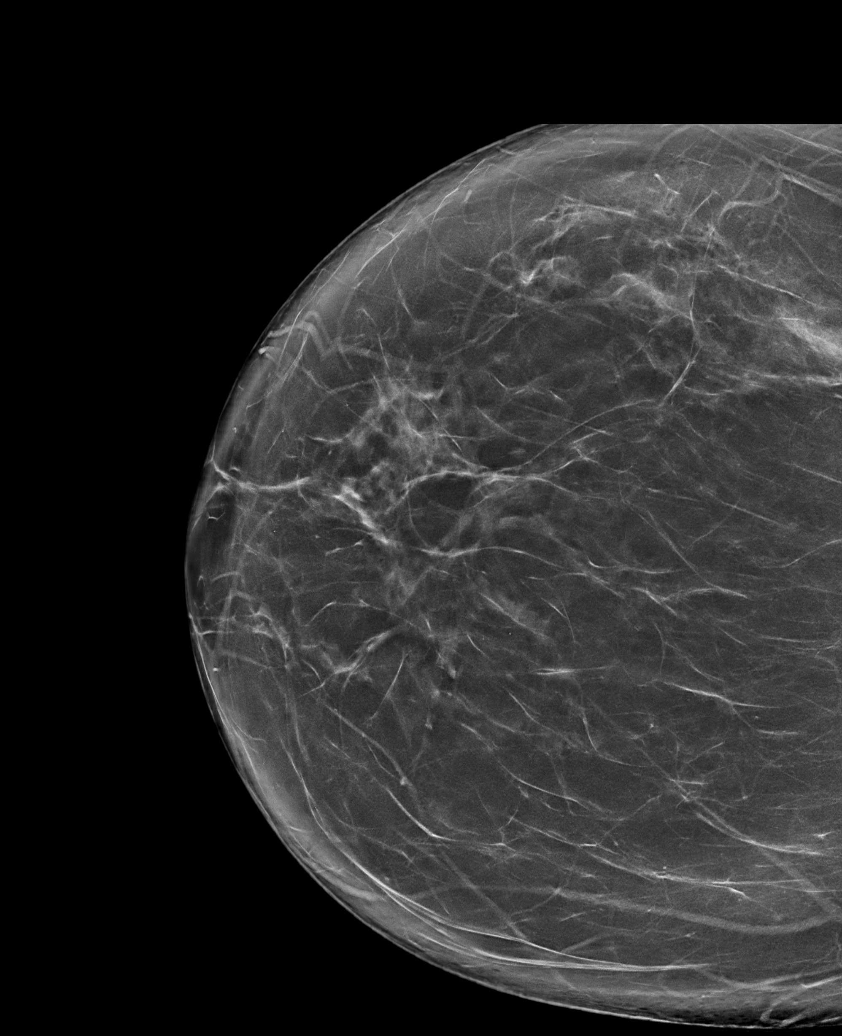

[L CC synth-2D (1 of 2)]
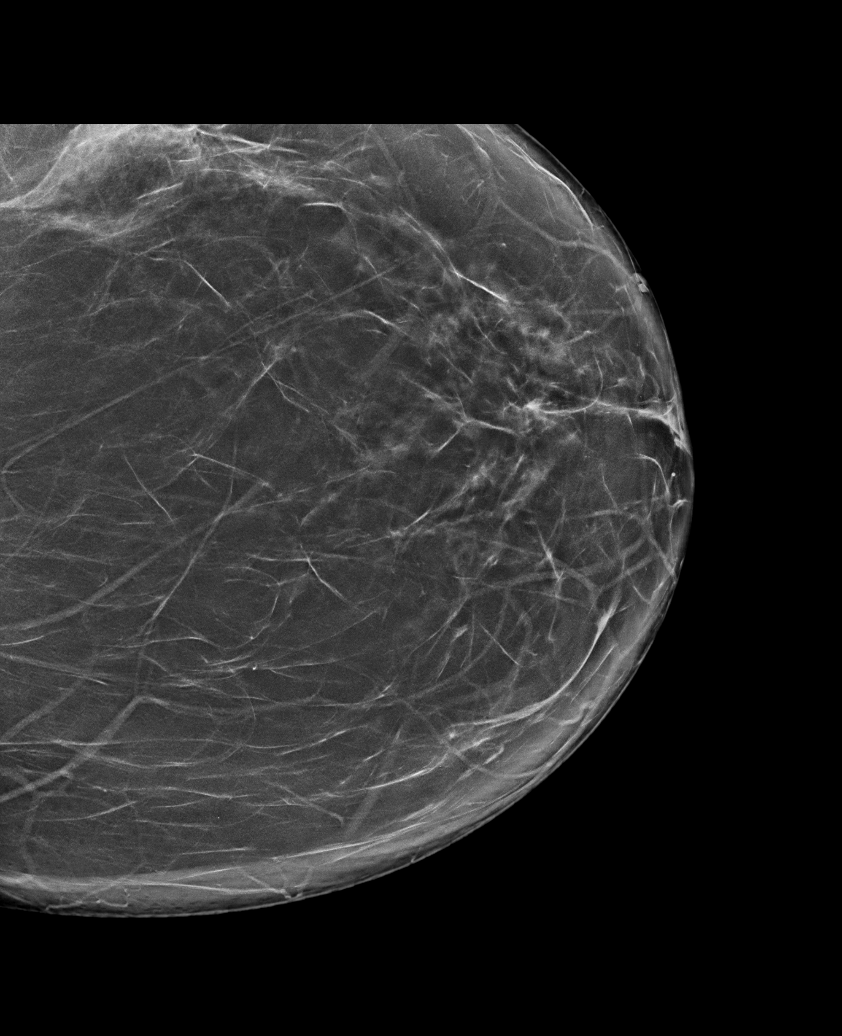

[L CC synth-2D (2 of 2)]
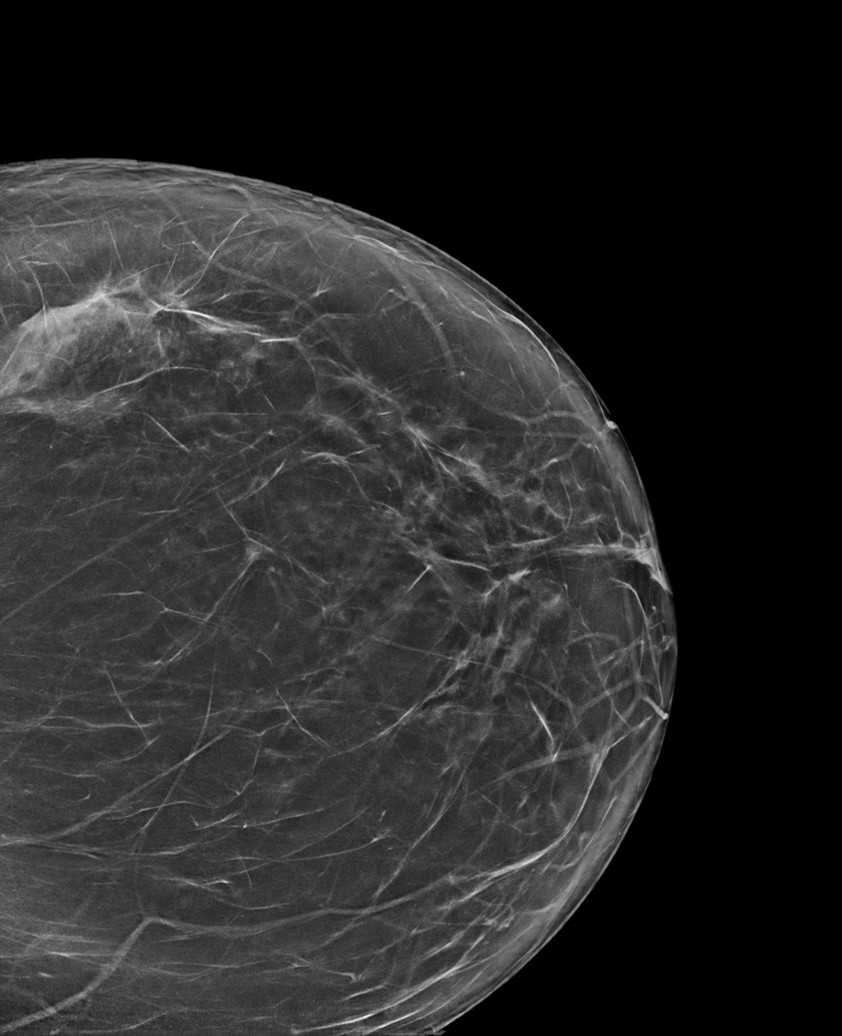

[R MLO synth-2D]
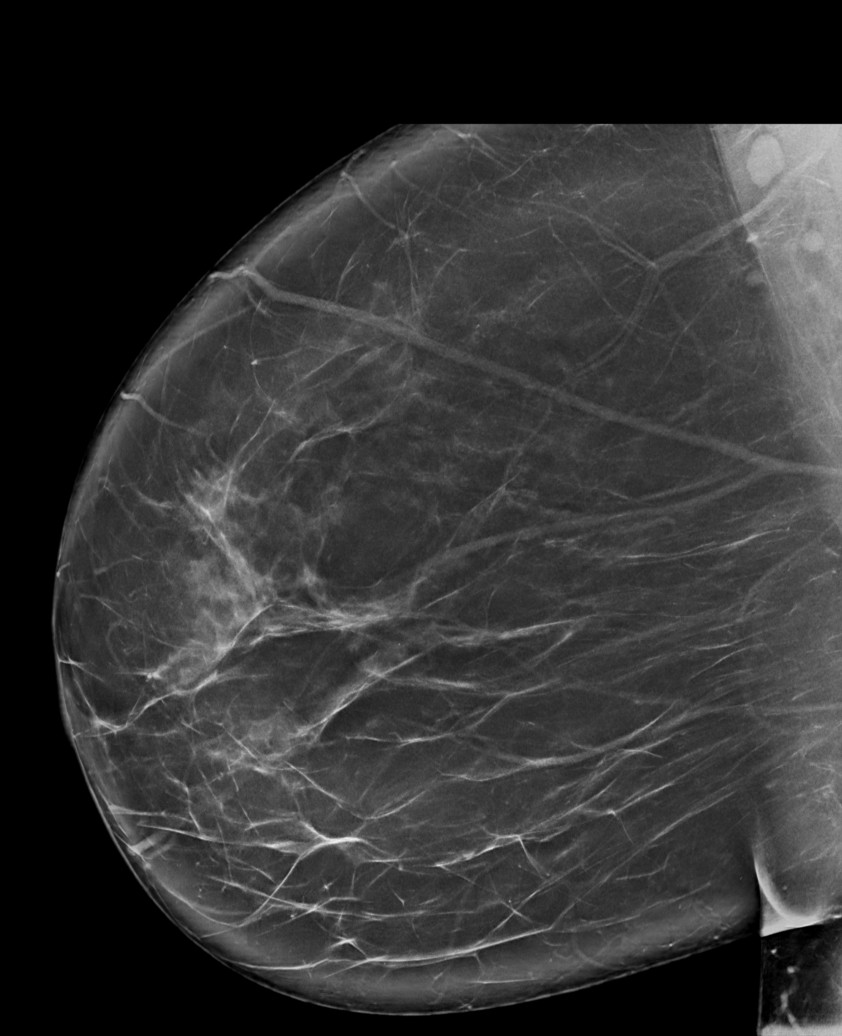

[L MLO synth-2D]
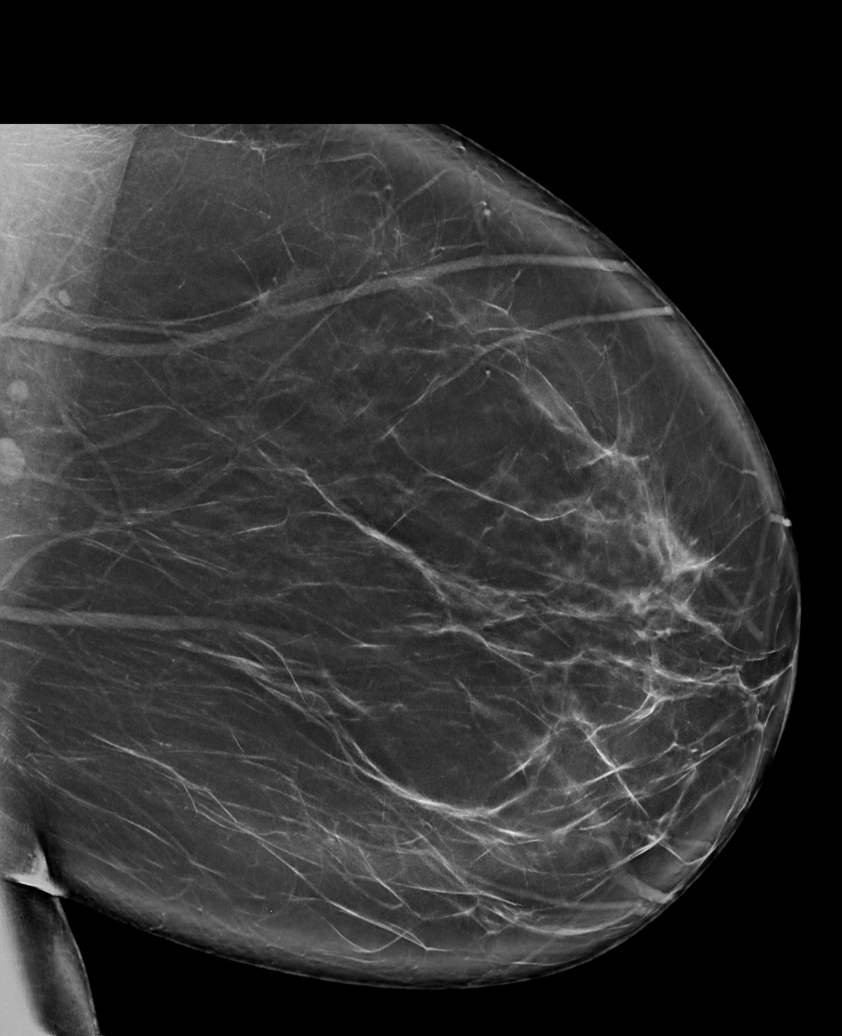

[R CC synth-2D (2 of 2)]
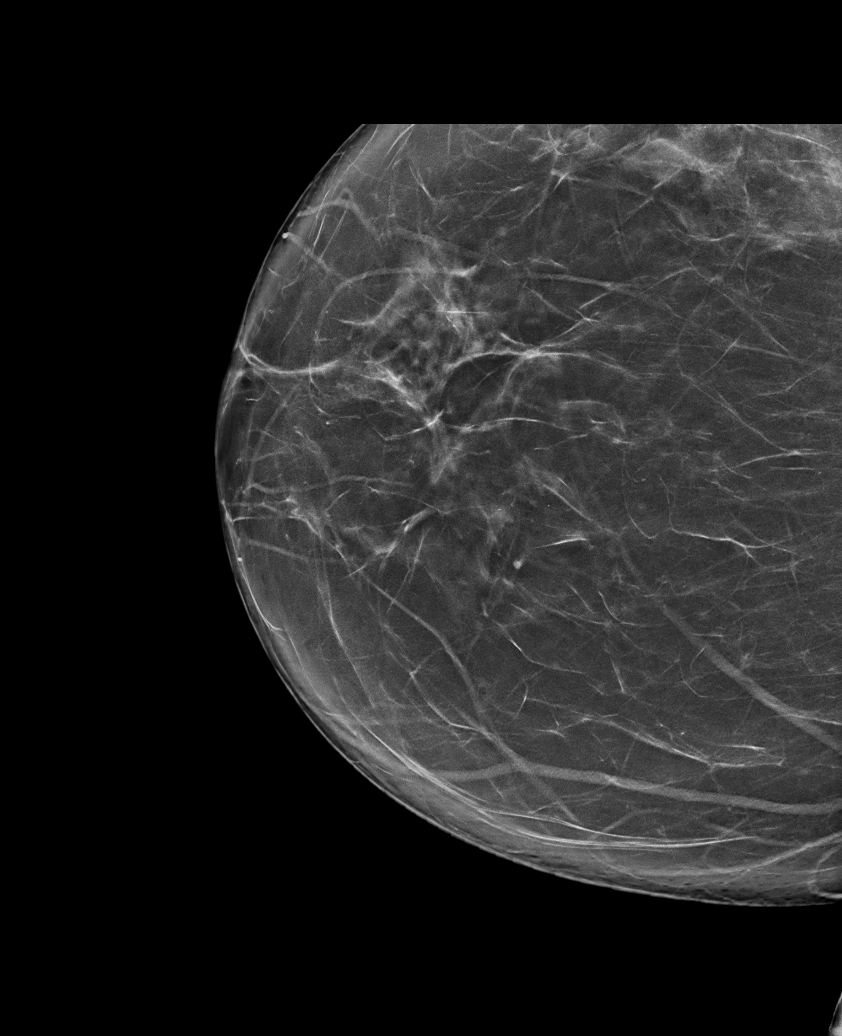

[6 of 26 positions shown; findings below may reference images not displayed]

ACR Breast Density Category b: There are scattered areas of
fibroglandular density.
FINDINGS: There are no findings suspicious for malignancy.
IMPRESSION: No mammographic evidence of malignancy. A result letter of this
screening mammogram will be mailed directly to the patient.

RECOMMENDATION:
Screening mammogram in one year. (Code:51-O-LD2)

BI-RADS CATEGORY  1: Negative.

## 2023-11-01 ENCOUNTER — Other Ambulatory Visit (HOSPITAL_COMMUNITY)

## 2023-11-02 ENCOUNTER — Ambulatory Visit (HOSPITAL_COMMUNITY): Admission: RE | Admit: 2023-11-02 | Source: Home / Self Care | Admitting: Obstetrics and Gynecology

## 2023-11-02 DIAGNOSIS — Z01818 Encounter for other preprocedural examination: Secondary | ICD-10-CM

## 2023-11-02 HISTORY — DX: Iron deficiency anemia, unspecified: D50.9

## 2023-11-02 HISTORY — DX: Leiomyoma of uterus, unspecified: D25.9

## 2023-11-02 HISTORY — DX: Moderate persistent asthma, uncomplicated: J45.40

## 2023-11-02 HISTORY — DX: Obstructive sleep apnea (adult) (pediatric): G47.33

## 2023-11-02 HISTORY — DX: Presence of spectacles and contact lenses: Z97.3

## 2023-11-02 HISTORY — DX: Other seasonal allergic rhinitis: J30.2

## 2023-11-02 HISTORY — DX: Gastro-esophageal reflux disease without esophagitis: K21.9

## 2023-11-02 HISTORY — DX: Personal history of other infectious and parasitic diseases: Z86.19

## 2023-11-02 SURGERY — HYSTERECTOMY, TOTAL, LAPAROSCOPIC, ROBOT-ASSISTED WITH SALPINGECTOMY
Anesthesia: Choice

## 2023-11-07 ENCOUNTER — Ambulatory Visit: Payer: Self-pay

## 2023-11-10 ENCOUNTER — Ambulatory Visit
Admission: RE | Admit: 2023-11-10 | Discharge: 2023-11-10 | Disposition: A | Discharge status: Home / Self Care | Payer: Self-pay | Source: Ambulatory Visit | Attending: Family Medicine | Admitting: Family Medicine

## 2023-11-10 DIAGNOSIS — Z1231 Encounter for screening mammogram for malignant neoplasm of breast: Secondary | ICD-10-CM

## 2023-11-17 ENCOUNTER — Encounter: Payer: Self-pay | Admitting: Pulmonary Disease

## 2023-11-17 ENCOUNTER — Ambulatory Visit (INDEPENDENT_AMBULATORY_CARE_PROVIDER_SITE_OTHER): Admitting: Pulmonary Disease

## 2023-11-17 VITALS — BP 128/84 | HR 113 | Ht 71.0 in | Wt 252.0 lb

## 2023-11-17 DIAGNOSIS — U099 Post covid-19 condition, unspecified: Secondary | ICD-10-CM

## 2023-11-17 DIAGNOSIS — J45909 Unspecified asthma, uncomplicated: Secondary | ICD-10-CM

## 2023-11-17 DIAGNOSIS — R942 Abnormal results of pulmonary function studies: Secondary | ICD-10-CM

## 2023-11-17 DIAGNOSIS — R0789 Other chest pain: Secondary | ICD-10-CM

## 2023-11-17 MED ORDER — ALBUTEROL SULFATE (2.5 MG/3ML) 0.083% IN NEBU: 2.5000 mg | INHALATION_SOLUTION | Freq: Four times a day (QID) | RESPIRATORY_TRACT | 5 refills | Status: AC | PRN

## 2023-11-17 MED ORDER — AMOXICILLIN-POT CLAVULANATE 875-125 MG PO TABS: 1.0000 | ORAL_TABLET | Freq: Two times a day (BID) | ORAL | 0 refills | Status: AC

## 2023-11-17 NOTE — Patient Instructions (Addendum)
 Refills for your nebulizer sent in  Prescription for Augmentin sent in  We will get a CT scan of the chest on you  Follow-up in about 6 to 8 weeks  Once we have your respiratory infection treated, if there is persistence of asthma symptoms, a biologic may need to be considered

## 2023-11-17 NOTE — Progress Notes (Signed)
 Kaitlyn Hays    [id number]    26-Mar-1981  Primary Care Physician:Reese, Cathe Clore, MD  Referring Physician: Danella Dunn, MD [address] [place],  [place] [address]  Chief complaint:   Patient being seen for asthma HPI:  Patient with a known history of asthma  Does have nasal stuffiness and congestion, bringing up yellow-green mucus  Persistent complaints of chest discomfort, chest pain Recently switched from Breztri  to Advair   The change has symptoms better controlled but still does have some chest discomfort  She does have a cough congestion Has had multiple exacerbation requiring steroids  She does have more symptoms usually around fall and spring  Had discussed about Biologics previously  Never smoker, secondhand smoke exposure when she was younger, spouse smokes outside the house  She does have a background history of allergic rhinitis, GERD, IDA, fibroids  History of obstructive sleep apnea that is mild with an AHI of 7.1  No pets, no family history of asthma Outpatient Encounter Medications as of 11/17/2023  Medication Sig   albuterol  (PROVENTIL ) (2.5 MG/3ML) 0.083% nebulizer solution INHALE 1 VIAL PER NEBULIZER EVERY 6 HOURS AS NEEDED FOR WHEEZING OR SHORTNESS OF BREATH (Patient taking differently: Take 2.5 mg by nebulization every 6 (six) hours as needed for shortness of breath or wheezing.)   albuterol  (VENTOLIN  HFA) 108 (90 Base) MCG/ACT inhaler Inhale 1-2 puffs into the lungs every 6 (six) hours as needed for wheezing or shortness of breath. (Patient taking differently: Inhale 1-2 puffs into the lungs every 6 (six) hours as needed for wheezing or shortness of breath.)   cetirizine  (ZYRTEC ) 10 MG tablet Take 10 mg by mouth at bedtime.   fluticasone  (FLONASE ) 50 MCG/ACT nasal spray Place 2 sprays into both nostrils daily.   fluticasone -salmeterol (ADVAIR ) 500-50 MCG/ACT AEPB Inhale 1 puff into the lungs in the morning and at bedtime. (Patient taking  differently: Inhale 1 puff into the lungs in the morning and at bedtime.)   megestrol  (MEGACE ) 40 MG tablet TAKE 1 TABLET BY MOUTH TWICE DAILY. CAN INCREASE TO 2 TABLETS TWICE DAILY IN THE EVENT OF HEAVY BLEEDING (Patient taking differently: Take 40 mg by mouth every other day.)   montelukast  (SINGULAIR ) 10 MG tablet TAKE 1 TABLET(10 MG) BY MOUTH AT BEDTIME (Patient taking differently: Take 10 mg by mouth at bedtime.)   Polyethylene Glycol 400 (VISINE DRY EYE RELIEF) 1 % SOLN Place 1 drop into both eyes 2 (two) times daily as needed.   No facility-administered encounter medications on file as of 11/17/2023.    Allergies as of 11/17/2023   (No Known Allergies)    Past Medical History:  Diagnosis Date   GERD (gastroesophageal reflux disease)    takes med prn   History of cervical dysplasia 2023   s/p colypo   ,  CIN 1   History of gonorrhea    IDA (iron  deficiency anemia)    Moderate persistent asthma without complication    pulmononary--- dr n. desai/ dr Magda Schneider;   per epic office note,  pt has frequent exacerbations;  hx eosinophilic asthma   OSA (obstructive sleep apnea)    HST 07-28-2022 in epic,  mild OSA,  AHI 7.1/ hr    (10-27-2023  pt stated she never got the machine)   Seasonal allergic rhinitis    Uterine fibroid    Wears contact lenses     Past Surgical History:  Procedure Laterality Date   TIBIA IM NAIL INSERTION  Left 08/07/2013   Procedure: INTRAMEDULLARY (IM) NAIL TIBIAL;  Surgeon: Bevin Bucks, MD;  Location: [facility];  Service: Orthopedics;  Laterality: Left;    Family History  Problem Relation Age of Onset   Hypertension Other    Breast cancer Neg Hx     Social History   Socioeconomic History   Marital status: Significant Other    Spouse name: Not on file   Number of children: Not on file   Years of education: Not on file   Highest education level: Not on file  Occupational History   Not on file  Tobacco Use   Smoking status: Never   Smokeless  tobacco: Never  Vaping Use   Vaping status: Never Used  Substance and Sexual Activity   Alcohol use: No   Drug use: Never   Sexual activity: Yes    Partners: Female    Birth control/protection: None  Other Topics Concern   Not on file  Social History Narrative   Not on file   Social Drivers of Health   Financial Resource Strain: Not on file  Food Insecurity: No Food Insecurity (04/06/2023)   Hunger Vital Sign    Worried About Running Out of Food in the Last Year: Never true    Ran Out of Food in the Last Year: Never true  Transportation Needs: No Transportation Needs (04/06/2023)   PRAPARE - Administrator, Civil Service (Medical): No    Lack of Transportation (Non-Medical): No  Physical Activity: Not on file  Stress: Not on file  Social Connections: Not on file  Intimate Partner Violence: Not on file    Review of Systems  Respiratory:  Positive for shortness of breath.     Vitals:   11/17/23 1039  BP: 128/84  Pulse: (!) 113  SpO2: 100%     Physical Exam Constitutional:      Appearance: She is obese.  HENT:     Head: Normocephalic.     Nose: Nose normal.     Mouth/Throat:     Mouth: Mucous membranes are moist.  Eyes:     General: No scleral icterus. Cardiovascular:     Rate and Rhythm: Normal rate and regular rhythm.     Heart sounds: No murmur heard.    No friction rub.  Pulmonary:     Effort: No respiratory distress.     Breath sounds: No stridor. No wheezing or rhonchi.  Musculoskeletal:     Cervical back: No rigidity or tenderness.  Neurological:     Mental Status: She is alert.  Psychiatric:        Mood and Affect: Mood normal.      Data Reviewed: Recent office visits reviewed  Pulmonary function test reviewed showing severe obstructive disease with no significant bronchodilator response, combined restriction with evidence of air trapping, mildly reduced diffusing capacity Decline in FVC and FEV1 progressive from previous  study  Assessment:  Asthma with uncontrolled symptoms Elevated IgE and eosinophil levels  Bronchitis  Abnormal pulmonary function test with combined obstruction and restriction  Advair  seems to have made a little bit of a difference for her  Still does have intermittent chest pain and discomfort  Plan/Recommendations: Prescription for Augmentin sent to pharmacy  Refilled albuterol   Will obtain a CT scan of the chest without contrast with concerns for worsening pulmonary function test  We did discuss again today following treatment of his respiratory infection, consideration for Biologics  Follow-up in about 6 to 8  weeks  Encouraged to call with significant concerns   Myer Artis MD [facility] 11/17/2023, 10:44 AM  CC: Danella Dunn, MD

## 2023-11-20 ENCOUNTER — Encounter: Payer: Self-pay | Admitting: Pulmonary Disease

## 2023-11-23 ENCOUNTER — Other Ambulatory Visit

## 2023-11-27 ENCOUNTER — Ambulatory Visit
Admission: RE | Admit: 2023-11-27 | Discharge: 2023-11-27 | Disposition: A | Discharge status: Home / Self Care | Source: Ambulatory Visit | Attending: Pulmonary Disease | Admitting: Pulmonary Disease

## 2023-11-27 DIAGNOSIS — R942 Abnormal results of pulmonary function studies: Secondary | ICD-10-CM

## 2023-11-27 DIAGNOSIS — R0789 Other chest pain: Secondary | ICD-10-CM

## 2023-11-27 DIAGNOSIS — U099 Post covid-19 condition, unspecified: Secondary | ICD-10-CM

## 2024-01-04 ENCOUNTER — Ambulatory Visit: Admitting: Pulmonary Disease

## 2024-01-04 ENCOUNTER — Encounter: Payer: Self-pay | Admitting: Pulmonary Disease

## 2024-01-18 ENCOUNTER — Ambulatory Visit (INDEPENDENT_AMBULATORY_CARE_PROVIDER_SITE_OTHER): Admitting: Pulmonary Disease

## 2024-01-18 VITALS — BP 125/79 | HR 108

## 2024-01-18 DIAGNOSIS — J309 Allergic rhinitis, unspecified: Secondary | ICD-10-CM

## 2024-01-18 DIAGNOSIS — G4733 Obstructive sleep apnea (adult) (pediatric): Secondary | ICD-10-CM

## 2024-01-18 DIAGNOSIS — R0602 Shortness of breath: Secondary | ICD-10-CM

## 2024-01-18 DIAGNOSIS — R0683 Snoring: Secondary | ICD-10-CM

## 2024-01-18 DIAGNOSIS — J4540 Moderate persistent asthma, uncomplicated: Secondary | ICD-10-CM

## 2024-01-18 DIAGNOSIS — R058 Other specified cough: Secondary | ICD-10-CM

## 2024-01-18 DIAGNOSIS — J4541 Moderate persistent asthma with (acute) exacerbation: Secondary | ICD-10-CM

## 2024-01-18 DIAGNOSIS — E669 Obesity, unspecified: Secondary | ICD-10-CM

## 2024-01-18 DIAGNOSIS — R0789 Other chest pain: Secondary | ICD-10-CM

## 2024-01-18 DIAGNOSIS — K219 Gastro-esophageal reflux disease without esophagitis: Secondary | ICD-10-CM

## 2024-01-18 DIAGNOSIS — U099 Post covid-19 condition, unspecified: Secondary | ICD-10-CM

## 2024-01-18 DIAGNOSIS — R942 Abnormal results of pulmonary function studies: Secondary | ICD-10-CM

## 2024-01-18 DIAGNOSIS — R7989 Other specified abnormal findings of blood chemistry: Secondary | ICD-10-CM

## 2024-01-18 DIAGNOSIS — G4730 Sleep apnea, unspecified: Secondary | ICD-10-CM

## 2024-01-18 DIAGNOSIS — J45909 Unspecified asthma, uncomplicated: Secondary | ICD-10-CM

## 2024-01-18 MED ORDER — ALBUTEROL SULFATE HFA 108 (90 BASE) MCG/ACT IN AERS: 1.0000 | INHALATION_SPRAY | Freq: Four times a day (QID) | RESPIRATORY_TRACT | 5 refills | Status: AC | PRN

## 2024-01-18 MED ORDER — FLUTICASONE-SALMETEROL 500-50 MCG/ACT IN AEPB: 1.0000 | INHALATION_SPRAY | Freq: Two times a day (BID) | RESPIRATORY_TRACT | 5 refills | Status: AC

## 2024-01-18 NOTE — Progress Notes (Signed)
 Kaitlyn Hays    [id number]    [birth year]  Primary Care Physician:Reese, Arthur, MD  Referring Physician: Ilah Arthur, MD [address] [place],  [place] [address]  Chief complaint:   Patient being seen for asthma  HPI:  Patient with a known history of asthma  Symptoms have been better lately  Compliant with inhalers Uses Advair  on a regular basis, on generic Advair   Has been staying active  Does use albuterol  on a regular basis She does not feel she absolutely has to  Occasional cough and congestion  We had had discussions about additional Biologics if symptoms are not well-controlled  Never smoker, secondhand smoke exposure when she was younger, spouse smokes outside the house  She does have a background history of allergic rhinitis, GERD, IDA, fibroids  History of obstructive sleep apnea that is mild with an AHI of 7.1  No pets, no family history of asthma Outpatient Encounter Medications as of 01/18/2024  Medication Sig   albuterol  (PROVENTIL ) (2.5 MG/3ML) 0.083% nebulizer solution INHALE 1 VIAL PER NEBULIZER EVERY 6 HOURS AS NEEDED FOR WHEEZING OR SHORTNESS OF BREATH (Patient taking differently: Take 2.5 mg by nebulization every 6 (six) hours as needed for shortness of breath or wheezing.)   albuterol  (PROVENTIL ) (2.5 MG/3ML) 0.083% nebulizer solution Take 3 mLs (2.5 mg total) by nebulization every 6 (six) hours as needed for wheezing or shortness of breath.   albuterol  (VENTOLIN  HFA) 108 (90 Base) MCG/ACT inhaler Inhale 1-2 puffs into the lungs every 6 (six) hours as needed for wheezing or shortness of breath. (Patient taking differently: Inhale 1-2 puffs into the lungs every 6 (six) hours as needed for wheezing or shortness of breath.)   cetirizine  (ZYRTEC ) 10 MG tablet Take 10 mg by mouth at bedtime.   fluticasone  (FLONASE ) 50 MCG/ACT nasal spray Place 2 sprays into both nostrils daily.   fluticasone -salmeterol (ADVAIR ) 500-50 MCG/ACT AEPB Inhale 1  puff into the lungs in the morning and at bedtime. (Patient taking differently: Inhale 1 puff into the lungs in the morning and at bedtime.)   megestrol  (MEGACE ) 40 MG tablet TAKE 1 TABLET BY MOUTH TWICE DAILY. CAN INCREASE TO 2 TABLETS TWICE DAILY IN THE EVENT OF HEAVY BLEEDING (Patient taking differently: Take 40 mg by mouth every other day.)   montelukast  (SINGULAIR ) 10 MG tablet TAKE 1 TABLET(10 MG) BY MOUTH AT BEDTIME (Patient taking differently: Take 10 mg by mouth at bedtime.)   Polyethylene Glycol 400 (VISINE DRY EYE RELIEF) 1 % SOLN Place 1 drop into both eyes 2 (two) times daily as needed.   No facility-administered encounter medications on file as of 01/18/2024.    Allergies as of 01/18/2024   (No Known Allergies)    Past Medical History:  Diagnosis Date   GERD (gastroesophageal reflux disease)    takes med prn   History of cervical dysplasia 2023   s/p colypo   ,  CIN 1   History of gonorrhea    IDA (iron  deficiency anemia)    Moderate persistent asthma without complication    pulmononary--- dr n. desai/ dr milagros;   per epic office note,  pt has frequent exacerbations;  hx eosinophilic asthma   OSA (obstructive sleep apnea)    HST 07-28-2022 in epic,  mild OSA,  AHI 7.1/ hr    (10-27-2023  pt stated she never got the machine)   Seasonal allergic rhinitis    Uterine fibroid    Wears  contact lenses     Past Surgical History:  Procedure Laterality Date   TIBIA IM NAIL INSERTION Left 08/07/2013   Procedure: INTRAMEDULLARY (IM) NAIL TIBIAL;  Surgeon: Donnice JONETTA Car, MD;  Location: [facility];  Service: Orthopedics;  Laterality: Left;    Family History  Problem Relation Age of Onset   Hypertension Other    Breast cancer Neg Hx     Social History   Socioeconomic History   Marital status: Significant Other    Spouse name: Not on file   Number of children: Not on file   Years of education: Not on file   Highest education level: Not on file  Occupational History   Not  on file  Tobacco Use   Smoking status: Never   Smokeless tobacco: Never  Vaping Use   Vaping status: Never Used  Substance and Sexual Activity   Alcohol use: No   Drug use: Never   Sexual activity: Yes    Partners: Female    Birth control/protection: None  Other Topics Concern   Not on file  Social History Narrative   Not on file   Social Drivers of Health   Financial Resource Strain: Not on file  Food Insecurity: No Food Insecurity (04/06/2023)   Hunger Vital Sign    Worried About Running Out of Food in the Last Year: Never true    Ran Out of Food in the Last Year: Never true  Transportation Needs: No Transportation Needs (04/06/2023)   PRAPARE - Administrator, Civil Service (Medical): No    Lack of Transportation (Non-Medical): No  Physical Activity: Not on file  Stress: Not on file  Social Connections: Not on file  Intimate Partner Violence: Not on file    Review of Systems  Respiratory:  Positive for shortness of breath.     Vitals:   01/18/24 1047  BP: 125/79  Pulse: (!) 108  SpO2: 100%     Physical Exam Constitutional:      Appearance: She is obese.  HENT:     Head: Normocephalic.     Nose: Nose normal.     Mouth/Throat:     Mouth: Mucous membranes are moist.  Eyes:     General: No scleral icterus. Cardiovascular:     Rate and Rhythm: Normal rate and regular rhythm.     Heart sounds: No murmur heard.    No friction rub.  Pulmonary:     Effort: No respiratory distress.     Breath sounds: No stridor. No wheezing or rhonchi.  Musculoskeletal:     Cervical back: No rigidity or tenderness.  Neurological:     Mental Status: She is alert.  Psychiatric:        Mood and Affect: Mood normal.    Data Reviewed: Recent office visits reviewed  Pulmonary function test reviewed showing severe obstructive disease with no significant bronchodilator response, combined restriction with evidence of air trapping, mildly reduced diffusing  capacity Decline in FVC and FEV1 progressive from previous study  CT scan of the chest 11/27/2023 with no infiltrative process  Assessment:  Asthma with uncontrolled symptoms Elevated IgE and eosinophil levels  Recurrent bronchitis-controlled at present  Abnormal pulmonary function test with obstruction and restriction  Seems to be improving with use of Advair  on a regular basis  Activity tolerance improved  Plan/Recommendations: Continue current inhalers  Continue Advair   Continue albuterol  as needed  Encouraged regular activities as tolerated  Follow-up in about 3 months  Call  with significant concerns   Kaitlyn Epley MD [facility] 01/18/2024, 10:49 AM  CC: Ilah Crigler, MD

## 2024-01-18 NOTE — Patient Instructions (Signed)
 We will confirm with the pharmacy about your inhaler  We will send in prescription for your inhaler and albuterol   Your CT scan did not show any significant abnormality  Follow-up in 3 months  We will consider initiating biologic if your symptoms are still not well-controlled despite ongoing treatment

## 2024-04-19 ENCOUNTER — Telehealth: Payer: Self-pay | Admitting: Pulmonary Disease

## 2024-04-19 ENCOUNTER — Ambulatory Visit: Admitting: Pulmonary Disease

## 2024-04-19 ENCOUNTER — Encounter: Payer: Self-pay | Admitting: Pulmonary Disease

## 2024-04-19 VITALS — BP 133/89 | HR 91 | Temp 98.3°F | Ht 71.0 in | Wt 247.0 lb

## 2024-04-19 DIAGNOSIS — R0981 Nasal congestion: Secondary | ICD-10-CM

## 2024-04-19 DIAGNOSIS — J8283 Eosinophilic asthma: Secondary | ICD-10-CM

## 2024-04-19 DIAGNOSIS — J45909 Unspecified asthma, uncomplicated: Secondary | ICD-10-CM

## 2024-04-19 DIAGNOSIS — G4733 Obstructive sleep apnea (adult) (pediatric): Secondary | ICD-10-CM | POA: Diagnosis not present

## 2024-04-19 MED ORDER — PREDNISONE 20 MG PO TABS: 20.0000 mg | ORAL_TABLET | Freq: Every day | ORAL | 0 refills | Status: AC

## 2024-04-19 MED ORDER — AMOXICILLIN-POT CLAVULANATE 875-125 MG PO TABS: 1.0000 | ORAL_TABLET | Freq: Two times a day (BID) | ORAL | 0 refills | Status: DC

## 2024-04-19 NOTE — Progress Notes (Signed)
 Kaitlyn Hays    [id number]    1980/06/15  Primary Care Physician:Reese, Arthur, MD  Referring Physician: Ilah Arthur, MD [address] [place],  [place] [address]  Chief complaint:   Patient being seen for asthma In for follow-up today Complaining of almost daily headaches, cough and congestion  HPI:  Asthma that appears uncontrolled at the present time  Has had a cough, productive of grayish phlegm sometimes clear sometimes greenish Has just been feeling very poorly  Waking up out of sleep with a cough  Has been having a daily headache  Claims compliance with Advair , tries to stay active  Past diagnosis of obstructive sleep apnea, was not able to initiate treatment at the time of diagnosis because of cost  She continues to have significant symptoms suggestive significant sleep disordered breathing and has been having a daily headache Gasping respirations at night Dryness of her mouth in the morning on some occasions No night sweats Usually goes to bed about 2 AM, final wake up to about 7 AM Does not feel rested in the morning  We have had discussions with her previously about initiation of biologic for asthma  Has had at least 3 exacerbations this year requiring steroid burst for treatment  Never smoker, secondhand smoke exposure when she was younger, spouse smokes outside the house  She does have a background history of allergic rhinitis, GERD, IDA, fibroids  History of obstructive sleep apnea that is mild with an AHI of 7.1  No pets, no family history of asthma Outpatient Encounter Medications as of 04/19/2024  Medication Sig   albuterol  (PROVENTIL ) (2.5 MG/3ML) 0.083% nebulizer solution INHALE 1 VIAL PER NEBULIZER EVERY 6 HOURS AS NEEDED FOR WHEEZING OR SHORTNESS OF BREATH   albuterol  (PROVENTIL ) (2.5 MG/3ML) 0.083% nebulizer solution Take 3 mLs (2.5 mg total) by nebulization every 6 (six) hours as needed for wheezing or shortness of breath.    albuterol  (VENTOLIN  HFA) 108 (90 Base) MCG/ACT inhaler Inhale 1-2 puffs into the lungs every 6 (six) hours as needed for wheezing or shortness of breath.   amoxicillin -clavulanate (AUGMENTIN ) 875-125 MG tablet Take 1 tablet by mouth 2 (two) times daily.   cetirizine  (ZYRTEC ) 10 MG tablet Take 10 mg by mouth at bedtime.   fluticasone  (FLONASE ) 50 MCG/ACT nasal spray Place 2 sprays into both nostrils daily.   fluticasone -salmeterol (ADVAIR ) 500-50 MCG/ACT AEPB Inhale 1 puff into the lungs in the morning and at bedtime.   megestrol  (MEGACE ) 40 MG tablet TAKE 1 TABLET BY MOUTH TWICE DAILY. CAN INCREASE TO 2 TABLETS TWICE DAILY IN THE EVENT OF HEAVY BLEEDING   montelukast  (SINGULAIR ) 10 MG tablet TAKE 1 TABLET(10 MG) BY MOUTH AT BEDTIME   predniSONE  (DELTASONE ) 20 MG tablet Take 1 tablet (20 mg total) by mouth daily with breakfast.   Polyethylene Glycol 400 (VISINE DRY EYE RELIEF) 1 % SOLN Place 1 drop into both eyes 2 (two) times daily as needed. (Patient not taking: Reported on 04/19/2024)   No facility-administered encounter medications on file as of 04/19/2024.    Allergies as of 04/19/2024   (No Known Allergies)    Past Medical History:  Diagnosis Date   GERD (gastroesophageal reflux disease)    takes med prn   History of cervical dysplasia 2023   s/p colypo   ,  CIN 1   History of gonorrhea    IDA (iron  deficiency anemia)    Moderate persistent asthma without complication  pulmononary--- dr n. desai/ dr milagros;   per epic office note,  pt has frequent exacerbations;  hx eosinophilic asthma   OSA (obstructive sleep apnea)    HST 07-28-2022 in epic,  mild OSA,  AHI 7.1/ hr    (10-27-2023  pt stated she never got the machine)   Seasonal allergic rhinitis    Uterine fibroid    Wears contact lenses     Past Surgical History:  Procedure Laterality Date   TIBIA IM NAIL INSERTION Left 08/07/2013   Procedure: INTRAMEDULLARY (IM) NAIL TIBIAL;  Surgeon: Donnice JONETTA Car, MD;  Location: [facility];  Service: Orthopedics;  Laterality: Left;    Family History  Problem Relation Age of Onset   Hypertension Other    Breast cancer Neg Hx     Social History   Socioeconomic History   Marital status: Significant Other    Spouse name: Not on file   Number of children: Not on file   Years of education: Not on file   Highest education level: Not on file  Occupational History   Not on file  Tobacco Use   Smoking status: Never   Smokeless tobacco: Never  Vaping Use   Vaping status: Never Used  Substance and Sexual Activity   Alcohol use: No   Drug use: Never   Sexual activity: Yes    Partners: Female    Birth control/protection: None  Other Topics Concern   Not on file  Social History Narrative   Not on file   Social Drivers of Health   Financial Resource Strain: Not on file  Food Insecurity: No Food Insecurity (04/06/2023)   Hunger Vital Sign    Worried About Running Out of Food in the Last Year: Never true    Ran Out of Food in the Last Year: Never true  Transportation Needs: No Transportation Needs (04/06/2023)   PRAPARE - Administrator, Civil Service (Medical): No    Lack of Transportation (Non-Medical): No  Physical Activity: Not on file  Stress: Not on file  Social Connections: Not on file  Intimate Partner Violence: Not on file    Review of Systems  Respiratory:  Positive for shortness of breath.   Neurological:  Positive for headaches.  Psychiatric/Behavioral:  Positive for sleep disturbance.     Vitals:   04/19/24 0954  BP: 133/89  Pulse: 91  Temp: 98.3 F (36.8 C)  SpO2: 96%     Physical Exam Constitutional:      Appearance: She is obese.  HENT:     Head: Normocephalic.     Nose: Nose normal.     Mouth/Throat:     Mouth: Mucous membranes are moist.  Eyes:     General: No scleral icterus. Cardiovascular:     Rate and Rhythm: Normal rate and regular rhythm.     Heart sounds: No murmur heard.    No friction rub.   Pulmonary:     Effort: No respiratory distress.     Breath sounds: No stridor. Rhonchi present. No wheezing.  Musculoskeletal:     Cervical back: No rigidity or tenderness.  Neurological:     Mental Status: She is alert.  Psychiatric:        Mood and Affect: Mood normal.    Data Reviewed:  Pulmonary function test reviewed showing severe obstructive disease with no significant bronchodilator response, combined restriction with evidence of air trapping, mildly reduced diffusing capacity Decline in FVC and FEV1 progressive from previous  study  CT scan of the chest 11/27/2023 with no infiltrative process  Assessment:  Uncontrolled asthma with elevated IgE and eosinophil levels Eosinophilic asthma  Past history of mild obstructive sleep apnea - Not initiated on CPAP therapy  Daily headaches  Nasal stuffiness and congestion with associated cough and congestion Sinusitis versus bronchitis  With 3 exacerbations of asthma this year requiring steroids, may benefit from a biologic   Plan/Recommendations: Prescription for Augmentin  and prednisone   Will initiate paperwork for Tezspire  Will schedule for repeat sleep study to ascertain severity of sleep disordered breathing  Headache is likely related to untreated sleep disordered breathing  Uncontrolled asthma - Continue Advair  - Avoid known triggers  Follow-up in about 8 weeks  Tezspire 210 mg subcu every 4 weeks  Encouraged to call with significant concerns  Weight loss efforts encouraged  I spent 35 minutes Pre-charting, reviewing records, interviewing/examining patient, and managing orders.    Jennet Epley MD [facility] 04/19/2024, 5:11 PM  CC: Ilah Crigler, MD

## 2024-04-19 NOTE — Patient Instructions (Addendum)
 I will see you in about 8 weeks  We will schedule you for sleep study  I will call in a course of antibiotics and steroids-Augmentin , prednisone   Referral to pharmacy for tezspire for uncontrolled asthma -Eosinophilic asthma with compliance with Advair , 3 exacerbations this year requiring prednisone   Tezspire 210 mg q 4 weekly

## 2024-04-22 ENCOUNTER — Telehealth: Payer: Self-pay

## 2024-04-22 NOTE — Telephone Encounter (Signed)
 Submitted a Prior Authorization request to [facility] for TEZSPIRE via CoverMyMeds. Will update once we receive a response.  Key: [id number]

## 2024-05-01 NOTE — Telephone Encounter (Signed)
 Response on CMM: Your PA request has been closed. unable to run mock claims after December 31st until the new plan is being fully uploaded automatically in the system. If [employer] would like to check then they need to contact our department after December 31st to check if PA is required for the next 60 days.  Called [facility] department and requested form be faxed to office. Rep will fax.  Phone: [phone]

## 2024-05-03 ENCOUNTER — Other Ambulatory Visit (HOSPITAL_COMMUNITY): Payer: Self-pay

## 2024-05-03 NOTE — Telephone Encounter (Signed)
 Received notification from [facility] regarding a prior authorization for TEZSPIRE. Authorization has been APPROVED from 05/03/2024 to 10/31/2024. Approval letter sent to scan center.  Patient must fill through [facility]: [phone]  Authorization # [id number]  Patient must contact [facility] to complete copay card enrollment: phone: [phone] She can reference confirmation # [id number]

## 2024-05-03 NOTE — Telephone Encounter (Signed)
 Received Tezspire pa form. Completed form and faxed with most recent chart note to [facility]. Will update when we receive a response.  Fax #: [phone]

## 2024-05-06 NOTE — Telephone Encounter (Signed)
 Spoke to patient for Kaitlyn Hays new start visit on 05/13/24 at 8:30 AM.

## 2024-05-06 NOTE — Telephone Encounter (Signed)
 Correction to previous - patient was booked for Tezspire new start on 05/13/24. Sent MyChart message with reminder to complete copay card enrollment.

## 2024-05-13 ENCOUNTER — Ambulatory Visit

## 2024-05-13 DIAGNOSIS — J4540 Moderate persistent asthma, uncomplicated: Secondary | ICD-10-CM

## 2024-05-13 DIAGNOSIS — Z7189 Other specified counseling: Secondary | ICD-10-CM

## 2024-05-13 MED ORDER — TEZSPIRE 210 MG/1.91ML ~~LOC~~ SOAJ: 210.0000 mg | SUBCUTANEOUS | 3 refills | Status: DC

## 2024-05-13 NOTE — Patient Instructions (Addendum)
 Your next Tezspire dose is due on 06/10/24, 07/09/23, and every 28 days thereafter  This medication is stored in the refrigerator. Please note, you should remove the drug from the refrigerator AT LEAST 45-60 minutes prior to injection to ensure the injection comes down to room temperature before injecting the medication. 30  If needed, this medication can be stored at room temperature for no longer than 30 days.   If you miss a dose, you should take it as soon as you remember it.   As a reminder, some side effects include injection site reaction, and less commonly, back pain or joint pain. If you are struggling with side effects, please call our office.   It can take several weeks to see the potential benefit of the medication. Please continue your other medications.   CONTINUE other respiratory medications as prescribed by your pulmonary team.  Your prescription will be shipped from [facility]: [phone].   Please call to schedule shipment and confirm address. They will mail your medication to your home.  Your copay should be affordable. If you call the pharmacy and it is not affordable, please double-check that they are billing through your copay card as secondary coverage. That copay card information is: ID: [id number] BIN: [id number] PCN: [id number] Group Number:  [id number]  You will need to be seen by your provider in 3 to 4 months to assess how Kaitlyn Hays is working for you. You have a follow-up visit scheduled with Dr. Neda on 06/19/24.   Stay up to date on all routine vaccines: influenza, pneumonia, COVID19, Shingles  How to manage an injection site reaction: Remember the 5 C's: COUNTER - leave on the counter at least 30 minutes but up to overnight to bring medication to room temperature. This may help prevent stinging COLD - place something cold (like an ice gel pack or cold water bottle) on the injection site just before cleansing with alcohol. This may help reduce  pain CLARITIN - use Claritin (generic name is loratadine) for the first two weeks of treatment or the day of, the day before, and the day after injecting. This will help to minimize injection site reactions CORTISONE CREAM - apply if injection site is irritated and itching CALL ME - if injection site reaction is bigger than the size of your fist, looks infected, blisters, or if you develop hives

## 2024-05-13 NOTE — Progress Notes (Signed)
 HPI Patient presents today to [facility] to see pharmacy team for [facility] new start.  Past medical history includes OSA and asthma. Last seen by Dr. Neda on 04/19/24. At that time, plan to start asthma biologic. Fill history shows 5 courses of prednisone  in the current calendar year.  Respiratory Medications Current regimen:  - Proventil  2.5mg /31mL 0.083% neb soln (Inhale 1 vial per nebulizer every 6 hours PRN wheezing or SOB) - Ventolin  108 mcg/act (Inhale 1-2 puffs into the lungs every 6 (six) hours as needed for wheezing or shortness of breath) - Advair  500-50 mcg/act (nhale 1 puff into the lungs in the morning and at bedtime) - Singulair  10mg  tab (Take 1 tablet by mouth at bedtime)  Patient reports adherence challenges - she ran out of Advair  inhaler. Advised she has refills she can fill at her pharmacy.  OBJECTIVE No Known Allergies  Outpatient Encounter Medications as of 05/13/2024  Medication Sig   albuterol  (PROVENTIL ) (2.5 MG/3ML) 0.083% nebulizer solution INHALE 1 VIAL PER NEBULIZER EVERY 6 HOURS AS NEEDED FOR WHEEZING OR SHORTNESS OF BREATH   albuterol  (PROVENTIL ) (2.5 MG/3ML) 0.083% nebulizer solution Take 3 mLs (2.5 mg total) by nebulization every 6 (six) hours as needed for wheezing or shortness of breath.   albuterol  (VENTOLIN  HFA) 108 (90 Base) MCG/ACT inhaler Inhale 1-2 puffs into the lungs every 6 (six) hours as needed for wheezing or shortness of breath.   amoxicillin -clavulanate (AUGMENTIN ) 875-125 MG tablet Take 1 tablet by mouth 2 (two) times daily.   cetirizine  (ZYRTEC ) 10 MG tablet Take 10 mg by mouth at bedtime.   fluticasone  (FLONASE ) 50 MCG/ACT nasal spray Place 2 sprays into both nostrils daily.   fluticasone -salmeterol (ADVAIR ) 500-50 MCG/ACT AEPB Inhale 1 puff into the lungs in the morning and at bedtime.   megestrol  (MEGACE ) 40 MG tablet TAKE 1 TABLET BY MOUTH TWICE DAILY. CAN INCREASE TO 2 TABLETS TWICE DAILY IN THE EVENT OF HEAVY BLEEDING    montelukast  (SINGULAIR ) 10 MG tablet TAKE 1 TABLET(10 MG) BY MOUTH AT BEDTIME   Polyethylene Glycol 400 (VISINE DRY EYE RELIEF) 1 % SOLN Place 1 drop into both eyes 2 (two) times daily as needed. (Patient not taking: Reported on 04/19/2024)   predniSONE  (DELTASONE ) 20 MG tablet Take 1 tablet (20 mg total) by mouth daily with breakfast.   No facility-administered encounter medications on file as of 05/13/2024.     Immunization History  Administered Date(s) Administered   PFIZER(Purple Top)SARS-COV-2 Vaccination 02/21/2020, 03/19/2020     PFTs    Latest Ref Rng & Units 10/11/2023   10:53 AM 11/25/2020    1:51 PM 02/24/2020   12:08 PM  PFT Results  FVC-Pre L 1.63  1.75  2.70   FVC-Predicted Pre % 35  44  69   FVC-Post L 1.55  1.61  2.52   FVC-Predicted Post % 34  41  64   Pre FEV1/FVC % % 72  85  75   Post FEV1/FCV % % 78  82  81   FEV1-Pre L 1.18  1.49  2.03   FEV1-Predicted Pre % 32  46  63   FEV1-Post L 1.20  1.32  2.03   DLCO uncorrected ml/min/mmHg 19.41  23.07  27.50   DLCO UNC% % 71  84  100   DLCO corrected ml/min/mmHg  23.07  27.50   DLCO COR %Predicted %  84  100   DLVA Predicted % 181  223  149   TLC L 4.82  4.40  4.62   TLC % Predicted % 78  72  75   RV % Predicted % 150  102  111      Eosinophils Most recent blood eosinophil count was 300 cells/microL taken on 01/05/23.   IgE: 134 on 01/05/23   Assessment   Biologics training for tezepulumab (Tezspire )  Goals of therapy: Mechanism: human monoclonal IgG2? antibody that binds to TSLP. This blocks TSLP from its effect on inflammation including reduce eosinophils, IgE, FeNO, IL-5, and IL-13. Mechanism is not definitively established. Reviewed that Tezspire  is add-on medication and patient must continue maintenance inhaler regimen. Response to therapy: may take 3-4 months to determine efficacy.  Side effects: injection site reaction (6-18%), antibody development (2%), arthralgia (4%), back pain (4%), pharyngitis  (4%)  Dose: Tezspire  210 mg once every 4 weeks  Administration/Storage:  Reviewed administration sites of thigh or abdomen (at least 2-3 inches away from abdomen). Reviewed the upper arm is only appropriate if caregiver is administering injection  Do not shake pen/syringe as this could lead to product foaming or precipitation. Do not shake syringe as this could lead to product foaming or precipitation.  Access: Approval of Tezspire  through: insurance Patient enrolled into copay card program to help with copay assistance. She called [employer]  [employer] while in office with me to complete copay card enrollment  Copay card information:  ID: [id number] BIN: [id number] PCN: [id number] Group Number: [id number]  HCP administered Tezspire  210mg /1.91 ml in back of R upper arm using sample  Of note, patient was educated on how to administer prefilled pen for at-home administration. She was able to use demo device appropriately. Due to HCP office having pre-filled syringe but not pen in stock, HCP administered pre-filled syringe today.  Tezspire  210mg /1.91 ml prefilled syringe NDC: 44486-887-03 Lot: [id number] Expiration: [id number]  Patient monitored for 30 minutes for adverse reaction.  Patient tolerated well. Patient denies itchiness and irritation at injection.  Medication Reconciliation  A drug regimen assessment was performed, including review of allergies, interactions, disease-state management, dosing and immunization history. Medications were reviewed with the patient, including name, instructions, indication, goals of therapy, potential side effects, importance of adherence, and safe use.   PLAN Continue Tezspire  210mg  SQ every 28 days.  [facility] Continue regimen of: Advair  500-50 mcg/act (Inhale 1 puff into the lungs in the morning and at bedtime), Singulair  10mg  tab (Take 1 tablet by mouth at bedtime), Proventil  2.5mg /18mL 0.083% neb soln (Inhale 1 vial per nebulizer  every 6 hours PRN wheezing or SOB), Ventolin  108 mcg/act (Inhale 1-2 puffs into the lungs every 6 (six) hours as needed for wheezing or shortness of breath)  All questions encouraged and answered.  Instructed patient to reach out with any further questions or concerns.  Thank you for allowing pharmacy to participate in this patient's care.  This appointment required 45 minutes of patient care (this includes precharting, chart review, review of results, face-to-face care, etc.).   Aleck Puls, PharmD, BCPS, CPP Clinical Pharmacist  [facility]  [facility]

## 2024-05-29 ENCOUNTER — Ambulatory Visit: Payer: Self-pay | Admitting: Pulmonary Disease

## 2024-05-29 NOTE — Telephone Encounter (Signed)
° °  Triage Disposition: See PCP When Office is Open (Within 3 Days)  Patient/caregiver understands and will follow disposition?: No, wishes to speak with PCP  Copied from CRM #[id number]. Topic: Clinical - Red Word Triage >> May 29, 2024 11:01 AM Ismael A wrote: [facility] that prompted transfer to Nurse Triage: pt not feeling well - has stuffy, runny nose states it has been going on for about 10 days, has SOB and headaches - has been taking OTC meds and not working Reason for Disposition  [1] Sinus congestion (pressure, fullness) AND [2] present > 10 days  Answer Assessment - Initial Assessment Questions Offered appt with pulm today, patient declined.  Advised call back or UC/ED if symptoms worsen. Advised to call pcp for appt.  Patient reports runny nose is lasting longer than usual since taking medication; TEZSPIRE .  1. LOCATION: Where does it hurt?      Runny nose; clear/ yellow drainage 2. ONSET: When did the sinus pain start?  (e.g., hours, days)      10 days  3. SEVERITY: How bad is the pain?   (Scale 0-10; or none, mild, moderate or severe)     No pain 5. NASAL CONGESTION: Is the nose blocked? If Yes, ask: Can you open it or must you breathe through your mouth?     Yes; benadryl, dayquil, albuterol  inhaler 2 puffs, zyrtec , singlair 6. NASAL DISCHARGE: Do you have discharge from your nose? If so ask, What color?     yellowish 7. FEVER: Do you have a fever? If Yes, ask: What is it, how was it measured, and when did it start?     Denies fever, chills n/v 8. OTHER SYMPTOMS: Do you have any other symptoms? (e.g., sore throat, cough, earache, difficulty breathing) Denies wheezing, diff breathing, sob, sore throat  Protocols used: Sinus Pain or Congestion-A-AH

## 2024-05-29 NOTE — Telephone Encounter (Signed)
 Noted, patient to contact PCP.  Nothing further needed.

## 2024-06-03 ENCOUNTER — Telehealth: Payer: Self-pay

## 2024-06-03 MED ORDER — TEZSPIRE 210 MG/1.91ML ~~LOC~~ SOAJ: 210.0000 mg | SUBCUTANEOUS | 3 refills | Status: AC

## 2024-06-03 NOTE — Addendum Note (Signed)
 Addended by: Beatryce Colombo L on: 06/03/2024 08:43 AM   Modules accepted: Orders

## 2024-06-03 NOTE — Telephone Encounter (Signed)
 Patient calls in asking to speak with pharmacist.   She reports she never received her [facility] - next dose due 06/10/24. [facility] - appears it was initially not electronically prescribed due to contents of notes section with copay card. Adjusted this section to fit e-prescribing constraints. Advised patient to call [facility] tomorrow, 06/04/24, if she does not hear from them today.   She also requests a prescription for acute cold symptoms - reports cough, runny/stuffy nose, headaches, not feeling like herself the last 2 weeks. Denies fever. Has not tested for flu/COVID. Symptoms persistent for 2 weeks, so she is asking for prescription. Routing request to clinical team for assistance.

## 2024-06-04 ENCOUNTER — Other Ambulatory Visit: Payer: Self-pay | Admitting: Pulmonary Disease

## 2024-06-04 MED ORDER — AZITHROMYCIN 250 MG PO TABS: ORAL_TABLET | ORAL | 0 refills | Status: DC

## 2024-06-04 MED ORDER — PREDNISONE 20 MG PO TABS: 20.0000 mg | ORAL_TABLET | Freq: Every day | ORAL | 0 refills | Status: AC

## 2024-06-04 NOTE — Telephone Encounter (Signed)
 Returned call to patient to collect more information about acute symptoms.   Denies fever, as stated initial message.  Reports productive cough - sometimes yellow/brown sputum. Sometimes clear. Reports there has been blood mixed in on two occasions.   Encouraged patient to go to urgent care. She verbalizes understanding.  If Dr. Neda has further recommendations, she will hear from our office.

## 2024-06-19 ENCOUNTER — Encounter: Payer: Self-pay | Admitting: Pulmonary Disease

## 2024-06-19 ENCOUNTER — Ambulatory Visit (INDEPENDENT_AMBULATORY_CARE_PROVIDER_SITE_OTHER): Admitting: Pulmonary Disease

## 2024-06-19 VITALS — BP 126/88 | HR 105 | Temp 99.0°F | Ht 71.0 in | Wt 255.6 lb

## 2024-06-19 DIAGNOSIS — J329 Chronic sinusitis, unspecified: Secondary | ICD-10-CM | POA: Diagnosis not present

## 2024-06-19 DIAGNOSIS — J4540 Moderate persistent asthma, uncomplicated: Secondary | ICD-10-CM | POA: Diagnosis not present

## 2024-06-19 DIAGNOSIS — G4733 Obstructive sleep apnea (adult) (pediatric): Secondary | ICD-10-CM | POA: Diagnosis not present

## 2024-06-19 DIAGNOSIS — J320 Chronic maxillary sinusitis: Secondary | ICD-10-CM

## 2024-06-19 MED ORDER — AMOXICILLIN-POT CLAVULANATE 875-125 MG PO TABS: 1.0000 | ORAL_TABLET | Freq: Two times a day (BID) | ORAL | 0 refills | Status: AC

## 2024-06-19 NOTE — Progress Notes (Signed)
 Patient missed Tezspire  dose that was due on 06/10/24 - Rx triaged to [facility] on 06/03/24 with confirmation that this was received by pharmacy per our records.  Used sample to administer Tezspire  in the back of L upper arm.   Tezspire  210mg /1.74mL subcutaneous injection, prefilled syringe NDC [phone] LOT [id number] EXP [id number]  Patient tolerated well. Her first dose was administered in clinic on 05/13/24. She denies significant side effects after first injection. Reports she had mild back ache for a couple of hours that then subsided.   Provided contact information to [facility], as she must fill Tezspire  with their pharmacy: [phone]. Please contact them at your earliest convenience to schedule shipment for the next dose of Tezspire . The copay card information was given to [facility] attached to the prescription. I provided this information to patient again today in office in case the pharmacy asks for this information:   That copay card information is: ID: [id number] BIN: [id number] PCN: [id number] Group Number: [id number]   Next Tezspire  dose is due on 07/17/24.   Please contact our office if you have any issues obtaining Tezspire .   Aleck Puls, PharmD, BCPS, CPP Clinical Pharmacist  [facility]  [facility]

## 2024-06-19 NOTE — Patient Instructions (Signed)
 Follow-up in about 3 months  Will call in a prescription for Augmentin   Referral to ENT for recurrent sinusitis  Continue with Tezspire  - Usually takes about 3 to 4 months for people to see significant changes in symptoms  Try and initiate CPAP whenever you can get yourself set up with one  Continue weight loss efforts  Call us  with significant concerns

## 2024-06-19 NOTE — Progress Notes (Signed)
 "              Kaitlyn Hays    [id number]    Dec 04, 1980  Primary Care Physician:Reese, Arthur, MD  Referring Physician: Ilah Arthur, MD [address] [place],  [place] [address]  Chief complaint:   In for follow-up with recurrent symptoms Recently treated with a course of antibiotics for sinus infection/bronchitis, did not resolve Having persistent symptoms History of obstructive sleep apnea History of difficult to control asthma  Discussed the use of AI scribe software for clinical note transcription with the patient, who gave verbal consent to proceed.  History of Present Illness Kaitlyn Hays is a 44 year old female who presents with persistent nasal congestion and possible occupational exposure concerns.  She has experienced persistent nasal congestion over the past few weeks, characterized by a greenish-yellow discharge, sometimes with blood. The congestion is worse upon waking, and she feels like there is something in her throat that she cannot clear. She has completed a course of antibiotics previously prescribed, but her symptoms have not completely resolved. She previously used a Z-Pak, which did not fully alleviate her symptoms.  She works in an environment with chemical exposure and is concerned that this may be contributing to her symptoms.  She missed her Tyspire injection scheduled for December 29th because the medication was not delivered to her home. She received an injection from the pharmacist previously but has not received subsequent doses.  She has not obtained a CPAP machine for her sleep apnea due to financial constraints, as it costs approximately $500.  Completed course of azithromycin  with nonresolution of symptoms Continues to have a lot of nasal stuffiness and congestion, bloody nasal discharges  Concerned with a work exposure to dust/fumes  Discussed about using a different course of antibiotics-will give Augmentin   Recently initiated Tezspire ,  did receive a shot with the pharmacist but did not receive medication that should have been mailed to her  Has not been able to initiate CPAP therapy because of financial constraints   Outpatient Encounter Medications as of 06/19/2024  Medication Sig   albuterol  (PROVENTIL ) (2.5 MG/3ML) 0.083% nebulizer solution INHALE 1 VIAL PER NEBULIZER EVERY 6 HOURS AS NEEDED FOR WHEEZING OR SHORTNESS OF BREATH   albuterol  (PROVENTIL ) (2.5 MG/3ML) 0.083% nebulizer solution Take 3 mLs (2.5 mg total) by nebulization every 6 (six) hours as needed for wheezing or shortness of breath.   albuterol  (VENTOLIN  HFA) 108 (90 Base) MCG/ACT inhaler Inhale 1-2 puffs into the lungs every 6 (six) hours as needed for wheezing or shortness of breath.   amoxicillin -clavulanate (AUGMENTIN ) 875-125 MG tablet Take 1 tablet by mouth 2 (two) times daily.   cetirizine  (ZYRTEC ) 10 MG tablet Take 10 mg by mouth at bedtime.   fluticasone  (FLONASE ) 50 MCG/ACT nasal spray Place 2 sprays into both nostrils daily.   fluticasone -salmeterol (ADVAIR ) 500-50 MCG/ACT AEPB Inhale 1 puff into the lungs in the morning and at bedtime.   megestrol  (MEGACE ) 40 MG tablet TAKE 1 TABLET BY MOUTH TWICE DAILY. CAN INCREASE TO 2 TABLETS TWICE DAILY IN THE EVENT OF HEAVY BLEEDING   montelukast  (SINGULAIR ) 10 MG tablet TAKE 1 TABLET(10 MG) BY MOUTH AT BEDTIME   predniSONE  (DELTASONE ) 20 MG tablet Take 1 tablet (20 mg total) by mouth daily with breakfast. (Patient not taking: Reported on 06/19/2024)   predniSONE  (DELTASONE ) 20 MG tablet Take 1 tablet (20 mg total) by mouth daily with breakfast. (Patient not taking: Reported on 06/19/2024)   Tezepelumab -ekko (TEZSPIRE )  210 MG/1. SOAJ Inject 210 mg into the skin every 28 (twenty-eight) days. (Patient not taking: Reported on 06/19/2024)   [DISCONTINUED] amoxicillin -clavulanate (AUGMENTIN ) 875-125 MG tablet Take 1 tablet by mouth 2 (two) times daily.   [DISCONTINUED] azithromycin  (ZITHROMAX  Z-PAK) 250 MG tablet Take 2  tablets day 1 and then 1 daily for 4 days (Patient not taking: Reported on 06/19/2024)   [DISCONTINUED] Polyethylene Glycol 400 (VISINE DRY EYE RELIEF) 1 % SOLN Place 1 drop into both eyes 2 (two) times daily as needed. (Patient not taking: Reported on 06/19/2024)   No facility-administered encounter medications on file as of 06/19/2024.    Allergies as of 06/19/2024   (No Known Allergies)    Past Medical History:  Diagnosis Date   GERD (gastroesophageal reflux disease)    takes med prn   History of cervical dysplasia 2023   s/p colypo   ,  CIN 1   History of gonorrhea    IDA (iron  deficiency anemia)    Moderate persistent asthma without complication    pulmononary--- dr n. desai/ dr milagros;   per epic office note,  pt has frequent exacerbations;  hx eosinophilic asthma   OSA (obstructive sleep apnea)    HST 07-28-2022 in epic,  mild OSA,  AHI 7.1/ hr    (10-27-2023  pt stated she never got the machine)   Seasonal allergic rhinitis    Uterine fibroid    Wears contact lenses     Past Surgical History:  Procedure Laterality Date   TIBIA IM NAIL INSERTION Left 08/07/2013   Procedure: INTRAMEDULLARY (IM) NAIL TIBIAL;  Surgeon: Donnice JONETTA Car, MD;  Location: [facility];  Service: Orthopedics;  Laterality: Left;    Family History  Problem Relation Age of Onset   Hypertension Other    Breast cancer Neg Hx     Social History   Socioeconomic History   Marital status: Significant Other    Spouse name: Not on file   Number of children: Not on file   Years of education: Not on file   Highest education level: Not on file  Occupational History   Not on file  Tobacco Use   Smoking status: Never   Smokeless tobacco: Never  Vaping Use   Vaping status: Never Used  Substance and Sexual Activity   Alcohol use: No   Drug use: Never   Sexual activity: Yes    Partners: Female    Birth control/protection: None  Other Topics Concern   Not on file  Social History Narrative   Not on file    Social Drivers of Health   Tobacco Use: Low Risk (06/19/2024)   Patient History    Smoking Tobacco Use: Never    Smokeless Tobacco Use: Never    Passive Exposure: Not on file  Financial Resource Strain: Not on file  Food Insecurity: No Food Insecurity (04/06/2023)   Hunger Vital Sign    Worried About Running Out of Food in the Last Year: Never true    Ran Out of Food in the Last Year: Never true  Transportation Needs: No Transportation Needs (04/06/2023)   PRAPARE - Administrator, Civil Service (Medical): No    Lack of Transportation (Non-Medical): No  Physical Activity: Not on file  Stress: Not on file  Social Connections: Not on file  Intimate Partner Violence: Not on file  Depression (PHQ2-9): Low Risk (04/06/2023)   Depression (PHQ2-9)    PHQ-2 Score: 3  Alcohol Screen: Not on file  Housing: Not on file  Utilities: Not on file  Health Literacy: Not on file    Review of Systems  HENT:  Positive for congestion, nosebleeds and sinus pressure.   Respiratory:  Positive for apnea.     Vitals:   06/19/24 0947 06/19/24 0950  BP: 126/88   Pulse: (!) 121 (!) 105  Temp: 99 F (37.2 C)   SpO2: 98%      Physical Exam Constitutional:      Appearance: She is obese.  HENT:     Head: Normocephalic.     Nose: Nose normal.     Mouth/Throat:     Mouth: Mucous membranes are moist.  Eyes:     Conjunctiva/sclera: Conjunctivae normal.  Cardiovascular:     Rate and Rhythm: Normal rate and regular rhythm.     Heart sounds: No murmur heard.    No friction rub.  Pulmonary:     Effort: No respiratory distress.     Breath sounds: No stridor. No wheezing or rhonchi.  Musculoskeletal:     Cervical back: No rigidity or tenderness.  Neurological:     Mental Status: She is alert.  Psychiatric:        Mood and Affect: Mood normal.    Data Reviewed: PFT with severe obstructive disease with no significant bronchodilator response, combined restriction with air  trapping PFT obstructive pattern did progressed from previous  CT scan of the chest 11/27/2023 reviewed with no infiltrative process  Assessment and Plan Assessment & Plan Chronic sinusitis Persistent symptoms despite previous antibiotic treatment. Symptoms include greenish-yellow nasal discharge with occasional blood. Possible occupational exposure to chemicals may be contributing to symptoms. - Prescribed Augmentin  for better penetration into nasal passages and air infections. - Referred to ENT for further evaluation and possible endoscopic examination.  Moderate persistent asthma Asthma symptoms may be exacerbated by occupational exposure to chemicals. Missed Tyspire injection on December 29th due to delivery issues. Tyspire typically takes 3-4 months to show significant improvement. - Ensured Tyspire injections are delivered to her home.  Obstructive sleep apnea Non-compliance with CPAP therapy due to financial constraints. CPAP machine cost is approximately $500. -Has not been able to initiate this at present  Will has sinusitis, will benefit from seeing ENT  She will receive a shot of test prior during the visit today Pharmacist will assist and follow-up on medication procurement   Orders Placed This Encounter  Procedures   Ambulatory referral to ENT    Referral Priority:   Routine    Referral Type:   Consultation    Referral Reason:   Specialty Services Required    Requested Specialty:   Otolaryngology    Number of Visits Requested:   1    I personally spent a total of 33 minutes in the care of the patient today including preparing to see the patient, getting/reviewing separately obtained history, performing a medically appropriate exam/evaluation, counseling and educating, referring and communicating with other health care professionals, and documenting clinical information in the EHR.   Jennet Epley MD [facility] 06/19/2024, 10:03 AM  CC:  Ilah Crigler, MD   "

## 2024-07-22 ENCOUNTER — Institutional Professional Consult (permissible substitution) (INDEPENDENT_AMBULATORY_CARE_PROVIDER_SITE_OTHER): Admitting: Otolaryngology
